# Patient Record
Sex: Male | Born: 1965 | Race: White | Hispanic: No | Marital: Married | Smoking: Former smoker
Health system: Southern US, Community
[De-identification: ages and names within clinical notes are randomized; demographics above are authoritative.]

## PROBLEM LIST (undated history)

## (undated) DIAGNOSIS — E785 Hyperlipidemia, unspecified: Secondary | ICD-10-CM

## (undated) DIAGNOSIS — F909 Attention-deficit hyperactivity disorder, unspecified type: Secondary | ICD-10-CM

## (undated) DIAGNOSIS — F419 Anxiety disorder, unspecified: Secondary | ICD-10-CM

## (undated) DIAGNOSIS — E119 Type 2 diabetes mellitus without complications: Secondary | ICD-10-CM

## (undated) DIAGNOSIS — T7840XA Allergy, unspecified, initial encounter: Secondary | ICD-10-CM

## (undated) DIAGNOSIS — E079 Disorder of thyroid, unspecified: Secondary | ICD-10-CM

## (undated) DIAGNOSIS — G4730 Sleep apnea, unspecified: Secondary | ICD-10-CM

## (undated) DIAGNOSIS — F32A Depression, unspecified: Secondary | ICD-10-CM

## (undated) DIAGNOSIS — F329 Major depressive disorder, single episode, unspecified: Secondary | ICD-10-CM

## (undated) DIAGNOSIS — F3181 Bipolar II disorder: Secondary | ICD-10-CM

## (undated) DIAGNOSIS — K219 Gastro-esophageal reflux disease without esophagitis: Secondary | ICD-10-CM

## (undated) HISTORY — DX: Gastro-esophageal reflux disease without esophagitis: K21.9

## (undated) HISTORY — DX: Hyperlipidemia, unspecified: E78.5

## (undated) HISTORY — DX: Sleep apnea, unspecified: G47.30

## (undated) HISTORY — PX: NO PAST SURGERIES: SHX2092

## (undated) HISTORY — PX: WISDOM TOOTH EXTRACTION: SHX21

## (undated) HISTORY — DX: Attention-deficit hyperactivity disorder, unspecified type: F90.9

## (undated) HISTORY — DX: Bipolar II disorder: F31.81

## (undated) HISTORY — DX: Allergy, unspecified, initial encounter: T78.40XA

## (undated) HISTORY — DX: Type 2 diabetes mellitus without complications: E11.9

## (undated) HISTORY — DX: Disorder of thyroid, unspecified: E07.9

---

## 2008-03-05 ENCOUNTER — Emergency Department (HOSPITAL_COMMUNITY): Admission: EM | Admit: 2008-03-05 | Discharge: 2008-03-05 | Payer: Self-pay | Admitting: Emergency Medicine

## 2008-12-22 ENCOUNTER — Emergency Department (HOSPITAL_COMMUNITY): Admission: EM | Admit: 2008-12-22 | Discharge: 2008-12-22 | Payer: Self-pay | Admitting: Emergency Medicine

## 2009-04-24 ENCOUNTER — Ambulatory Visit (HOSPITAL_BASED_OUTPATIENT_CLINIC_OR_DEPARTMENT_OTHER): Admission: RE | Admit: 2009-04-24 | Discharge: 2009-04-24 | Payer: Self-pay | Admitting: Pediatrics

## 2009-05-02 ENCOUNTER — Ambulatory Visit: Payer: Self-pay | Admitting: Internal Medicine

## 2012-10-17 ENCOUNTER — Institutional Professional Consult (permissible substitution): Payer: Self-pay | Admitting: Pulmonary Disease

## 2012-10-31 ENCOUNTER — Ambulatory Visit (INDEPENDENT_AMBULATORY_CARE_PROVIDER_SITE_OTHER): Payer: BC Managed Care – PPO | Admitting: Pulmonary Disease

## 2012-10-31 ENCOUNTER — Encounter: Payer: Self-pay | Admitting: Pulmonary Disease

## 2012-10-31 VITALS — BP 142/92 | HR 84 | Temp 98.3°F | Ht 74.0 in | Wt 216.2 lb

## 2012-10-31 DIAGNOSIS — G4733 Obstructive sleep apnea (adult) (pediatric): Secondary | ICD-10-CM

## 2012-10-31 DIAGNOSIS — G4769 Other sleep related movement disorders: Secondary | ICD-10-CM

## 2012-10-31 DIAGNOSIS — G4761 Periodic limb movement disorder: Secondary | ICD-10-CM | POA: Insufficient documentation

## 2012-10-31 DIAGNOSIS — R911 Solitary pulmonary nodule: Secondary | ICD-10-CM

## 2012-10-31 NOTE — Patient Instructions (Addendum)
Will get you associated with a medical equipment company to get new supplies Will get you set up for a sleep study with extra monitoring for neurologic issues and movements disorders.  Will also titrate your pressure to an optimal level.  Will arrange followup once the results are available.  You will need a followup ct in one year for your tiny nodule.

## 2012-10-31 NOTE — Assessment & Plan Note (Signed)
The patient has a history of mild obstructive sleep apnea, but now is having breakthrough snoring and events despite wearing bilevel.  This may simply be mask leaking, although he may need to have his pressure optimized again.  We also need to make sure his machine is in working order.  I have discussed the possibility of doing an auto titrating study at home to re-optimize his pressure, but his spouse is concerned about the abnormal movements and what they may represent.  Will therefore do a bilevel titration study while at the same time analyzing his movement disorder.

## 2012-10-31 NOTE — Assessment & Plan Note (Signed)
The patient has a tiny pulmonary nodule that most likely is an intraparenchymal lymph node or possibly an old histoplasmoma.  I would recommend one more followup in a year.

## 2012-10-31 NOTE — Progress Notes (Signed)
Subjective:    Patient ID: Zachary Dixon, male    DOB: 12-Nov-1965, 47 y.o.   MRN: [id number]  HPI The patient is a 47 year old male who I have been asked to see for an abnormal CT chest and also ongoing sleep issues.  The patient was recently having atypical chest pain, and underwent a CT scan of the chest that was unremarkable except for a 4 mm subpleural nodule in the right lower lobe.  The patient has little smoking history, and no personal history of cancer.  He has no history of tuberculosis exposure, and never has had a PPD placed.  He did grow up in the [place].  He has not been losing weight, and his appetite has been adequate.  The patient also carries a diagnosis of obstructive sleep apnea, and was started on bilevel approximately 2-3 years ago.  He has had multiple sleep studies, with the most recent available to me being in 2010 where he had an AHI of 9 and a vent per hour.  He was also noted to have periodic limb movements with 2 per hour causing arousals or awakenings.  The patient has not had his pressure changed since the initial diagnosis, and currently uses a full face mask with a heated humidifier.  His current mask has not been updated since 2013, and his wife states that she can hear breakthrough snoring on many nights.  He is unsure if he is having a lot of leaks.  The patient's weight has increased about 10 pounds from his original diagnosis.  The patient feels that he is more rested now than in the past, but does continue to have some inappropriate daytime sleepiness.  His wife also notes abnormal limb movements for years.  She describes rhythmic movements and stiffening of his legs, as well as sudden jerking of his upper extremity.  He has no history of a seizure disorder, and denies any symptoms suggestive of the restless leg syndrome.  These movements always start after he is asleep, but she never gets the feeling that he is "acting out his dreams".  He does not have sleep walking or  sleep talking, nor has he had violent behavior at night.  The patient does have a long history of depression.  He has been treated with some type of medication for his movements, and from the description it may have been a dopamine agonist that resulted in nausea.   Review of Systems  Constitutional: Negative for fever and unexpected weight change.  HENT: Positive for congestion and sneezing. Negative for ear pain, nosebleeds, sore throat, rhinorrhea, trouble swallowing, dental problem, postnasal drip and sinus pressure.   Eyes: Negative for redness and itching.  Respiratory: Positive for cough and shortness of breath. Negative for chest tightness and wheezing.   Cardiovascular: Positive for chest pain. Negative for palpitations and leg swelling.  Gastrointestinal: Negative for nausea and vomiting.       Acid heartburn  Genitourinary: Negative for dysuria.  Musculoskeletal: Negative for joint swelling.  Skin: Negative for rash.  Neurological: Negative for headaches.  Hematological: Does not bruise/bleed easily.  Psychiatric/Behavioral: Positive for dysphoric mood. The patient is not nervous/anxious.        Objective:   Physical Exam Constitutional:  Well developed, no acute distress  HENT:  Nares patent without discharge, mild turbinate hypertrophy  Oropharynx without exudate, palate and uvula are mildly elongated, mild webbing laterally in OP  Eyes:  Perrla, eomi, no scleral icterus  Neck:  No JVD, no  TMG  Cardiovascular:  Normal rate, regular rhythm, no rubs or gallops.  No murmurs        Intact distal pulses  Pulmonary :  Normal breath sounds, no stridor or respiratory distress   No rales, rhonchi, or wheezing  Abdominal:  Soft, nondistended, bowel sounds present.  No tenderness noted.   Musculoskeletal:  No lower extremity edema noted.  Lymph Nodes:  No cervical lymphadenopathy noted  Skin:  No cyanosis noted  Neurologic:  Appears sleepy but appropriate, moves all 4  extremities without obvious deficit.         Assessment & Plan:

## 2012-10-31 NOTE — Assessment & Plan Note (Signed)
The wife's description of the patient's movements are a little unusual for periodic limb movements.  The patient also denies any symptoms suggestive of restless legs.  I think we need to consider whether this could represent some type of seizure disorder, or other neurologic process.  We'll therefore do a sleep study with an expanded seizure montage, as well as extra leads for upper extremity movement.

## 2012-11-18 ENCOUNTER — Encounter (HOSPITAL_BASED_OUTPATIENT_CLINIC_OR_DEPARTMENT_OTHER): Payer: BC Managed Care – PPO

## 2012-11-20 ENCOUNTER — Ambulatory Visit (HOSPITAL_BASED_OUTPATIENT_CLINIC_OR_DEPARTMENT_OTHER): Payer: BC Managed Care – PPO | Attending: Pulmonary Disease

## 2012-11-20 VITALS — Ht 74.0 in | Wt 216.0 lb

## 2012-11-20 DIAGNOSIS — G4733 Obstructive sleep apnea (adult) (pediatric): Secondary | ICD-10-CM | POA: Insufficient documentation

## 2012-11-20 DIAGNOSIS — Z9989 Dependence on other enabling machines and devices: Secondary | ICD-10-CM

## 2012-11-20 DIAGNOSIS — G4769 Other sleep related movement disorders: Secondary | ICD-10-CM

## 2012-11-20 DIAGNOSIS — R259 Unspecified abnormal involuntary movements: Secondary | ICD-10-CM | POA: Insufficient documentation

## 2012-12-01 DIAGNOSIS — G4710 Hypersomnia, unspecified: Secondary | ICD-10-CM

## 2012-12-01 DIAGNOSIS — G4730 Sleep apnea, unspecified: Secondary | ICD-10-CM

## 2012-12-01 NOTE — Procedures (Signed)
NAMEGILBERTO, STANFORTH              ACCOUNT NO.:  [id number]  MEDICAL RECORD NO.:  [id number]          PATIENT TYPE:  OUT  LOCATION:  SLEEP CENTER                 FACILITY:  [facility]  PHYSICIAN:  Barbaraann Share, MD,FCCPDATE OF BIRTH:  04/09/68  DATE OF STUDY:  11/20/2012                           NOCTURNAL POLYSOMNOGRAM  REFERRING PHYSICIAN:  Barbaraann Share, MD,FCCP  INDICATION FOR STUDY:  Hypersomnia with sleep apnea.  EPWORTH SCORE:  11.  SLEEP ARCHITECTURE:  The patient had total sleep time of 352 minutes with decreased quantity of slow wave sleep and also REM.  Sleep onset latency was normal at 5 minutes and REM onset was prolonged at 300 minutes.  Sleep efficiency was fairly normal at 93%.  RESPIRATORY DATA:  The patient underwent a bilevel titration study, and was fitted with a large ResMed AirFit F10 full-face mask.  Bilevel was initiated, and he was ultimately found to have an optimal pressure of 13/8.  This controlled both his obstructive events and snoring, including during REM.  OXYGEN DATA:  There was O2 desaturation as low as 82% with the patient's obstructive events.  CARDIAC DATA:  No clinically significant arrhythmias were noted.  MOVEMENTS/PARASOMNIA:  The patient was found to have 378 periodic limb movements, with 5 per hour resulting in arousal or awakening.  However, almost all of these occurred prior to reaching optimal bilevel pressure, and essentially totally resolved once the patient had adequate control of his obstructive events.  Because of the patient's history of movements during the night, this study was done with extra limb leads, however, there was no evidence for REM behavior disorder or other neurologic issue during this night.  IMPRESSION/RECOMMENDATIONS: 1. Good control of previously documented obstructive sleep apnea with     a bilevel pressure of 13/8, delivered by a large ResMed AirFit F10     full-face mask.  The patient should also be  encouraged to work on     modest weight loss 2. Very large numbers of leg jerks with significant sleep disruption,     however, these essentially resolved at     optimal bilevel pressure.  The patient had no abnormal behaviors     noted during the night, or any indicators for a REM behavior     disorder or other parasomnia.     Barbaraann Share, MD,FCCP Diplomate, American Board of Sleep Medicine    [facility]/MEDQ  D:  12/01/2012 14:42:54  T:  12/01/2012 15:17:19  Job:  [id number]

## 2012-12-07 ENCOUNTER — Other Ambulatory Visit: Payer: Self-pay | Admitting: Pulmonary Disease

## 2012-12-07 DIAGNOSIS — G4769 Other sleep related movement disorders: Secondary | ICD-10-CM

## 2012-12-07 DIAGNOSIS — G4733 Obstructive sleep apnea (adult) (pediatric): Secondary | ICD-10-CM

## 2012-12-26 ENCOUNTER — Telehealth: Payer: Self-pay | Admitting: Pulmonary Disease

## 2012-12-26 NOTE — Telephone Encounter (Signed)
Order placed for [facility]  12/07/12 ?[facility] Start bilevel at 13/8 with large resmed airfit F10 full face mask. Pt needs ov with me in 8 weeks.  [facility] recorded this again and has patient on schedule in the AM at 830 to p/u supplies.

## 2013-02-18 ENCOUNTER — Ambulatory Visit (INDEPENDENT_AMBULATORY_CARE_PROVIDER_SITE_OTHER): Payer: BC Managed Care – PPO | Admitting: Pulmonary Disease

## 2013-02-18 ENCOUNTER — Encounter: Payer: Self-pay | Admitting: Pulmonary Disease

## 2013-02-18 VITALS — BP 130/82 | HR 79 | Temp 97.4°F | Ht 74.0 in | Wt 212.4 lb

## 2013-02-18 DIAGNOSIS — G4733 Obstructive sleep apnea (adult) (pediatric): Secondary | ICD-10-CM

## 2013-02-18 DIAGNOSIS — G4761 Periodic limb movement disorder: Secondary | ICD-10-CM

## 2013-02-18 MED ORDER — ROPINIROLE HCL 0.5 MG PO TABS: 0.5000 mg | ORAL_TABLET | Freq: Every evening | ORAL | Status: DC

## 2013-02-18 NOTE — Assessment & Plan Note (Signed)
The patient had classic periodic limb movements at the time of his sleep study, however these resolved with optimal treatment of his sleep apnea.  Despite being on appropriate bilevel, he continues to have leg kicks in his home environment, and they are quite disruptive.  I think it is worth trying him on a dopamine agonist to see if this will help.

## 2013-02-18 NOTE — Assessment & Plan Note (Signed)
The patient appears to be tolerating bilevel well from a pressure standpoint, and it has eliminated breakthrough snoring.  However, he is having issues with air gulping, and I outlined various ways to combat this.  These include a trial of Mylicon drops at night, changing to a nasal mask with a chin strap, decreasing the pressure that understanding he may have breakthrough events, and finally trying him on an auto bilevel device.  The patient will consider all of these, and get back with me.

## 2013-02-18 NOTE — Progress Notes (Signed)
  Subjective:    Patient ID: Zachary Dixon, male    DOB: [birth year], 47 y.o.   MRN: [id number]  HPI Patient comes in today for followup of his obstructive sleep apnea.  He recently had a titration study where he was found to have optimal pressure of 13/8.  He did have a lot of leg jerks during the night, but these resolved at optimal bilevel pressure.  He has been wearing his device compliantly at the new pressure, and his wife has not heard breakthrough snoring.  His current full face mask seems to fit well and he denies any complaints with it.  However, he is having an issue with air gulping leading to gastric distention in the mornings.  He is also continuing to have large numbers of leg jerks during the night which disrupts both his and his wife's sleep.  He still does not have classical symptoms for RLS.     Review of Systems  Constitutional: Negative for fever and unexpected weight change.  HENT: Negative for ear pain, nosebleeds, congestion, sore throat, rhinorrhea, sneezing, trouble swallowing, dental problem, postnasal drip and sinus pressure.   Eyes: Negative for redness and itching.  Respiratory: Negative for cough, chest tightness, shortness of breath and wheezing.   Cardiovascular: Negative for palpitations and leg swelling.  Gastrointestinal: Negative for nausea and vomiting.  Genitourinary: Negative for dysuria.  Musculoskeletal: Negative for joint swelling.  Skin: Negative for rash.  Neurological: Negative for headaches.  Hematological: Does not bruise/bleed easily.  Psychiatric/Behavioral: Negative for dysphoric mood. The patient is not nervous/anxious.        Objective:   Physical Exam Well-developed male in no acute distress Nose without purulence or discharge noted No skin breakdown or pressure necrosis from the CPAP mask Neck without lymphadenopathy or thyromegaly Lower extremities without edema, no cyanosis Awake, but does appear mildly sleepy.  Moves all 4  extremities.       Assessment & Plan:

## 2013-02-18 NOTE — Patient Instructions (Addendum)
Think about options we discussed to limit air gulping, and let us know Will start on requip 0.5 mg after dinner each night.  If helps, but not completely, can increase to 2 after dinner after one week.  Please give me some feedback by phone in 2-3 weeks with how things are going. If doing well, followup with me in 6mos.

## 2013-08-19 ENCOUNTER — Ambulatory Visit (INDEPENDENT_AMBULATORY_CARE_PROVIDER_SITE_OTHER): Payer: BC Managed Care – PPO | Admitting: Pulmonary Disease

## 2013-08-19 ENCOUNTER — Encounter (INDEPENDENT_AMBULATORY_CARE_PROVIDER_SITE_OTHER): Payer: Self-pay

## 2013-08-19 ENCOUNTER — Encounter: Payer: Self-pay | Admitting: Pulmonary Disease

## 2013-08-19 VITALS — BP 118/60 | HR 79 | Temp 98.4°F | Ht 74.0 in | Wt 216.4 lb

## 2013-08-19 DIAGNOSIS — G4761 Periodic limb movement disorder: Secondary | ICD-10-CM

## 2013-08-19 DIAGNOSIS — G4733 Obstructive sleep apnea (adult) (pediatric): Secondary | ICD-10-CM

## 2013-08-19 DIAGNOSIS — R911 Solitary pulmonary nodule: Secondary | ICD-10-CM

## 2013-08-19 MED ORDER — PRAMIPEXOLE DIHYDROCHLORIDE 0.125 MG PO TABS: ORAL_TABLET | ORAL | Status: DC

## 2013-08-19 NOTE — Assessment & Plan Note (Signed)
The patient has a pulmonary nodule that is probably old granulomatous disease from living in the [place]. He is due for his followup scan at one year.

## 2013-08-19 NOTE — Progress Notes (Signed)
   Subjective:    Patient ID: Zachary Dixon, male    DOB: 1965-10-20, 48 y.o.   MRN: [id number]  HPI The patient comes in today for followup of his multiple sleep issues, and also his pulmonary nodule. He is wearing his bilevel compliantly, and although is still having some air gulping, it is not overly bothersome to him. He does not want to try and decrease his pressure or look into a dental appliance. The patient also has a history of periodic limb movements, and this continues to be an issue for him that is disrupting sleep. He quit using his Requip proximally 6 months ago because of nausea, but it is unclear if he is really taking it on a full stomach nightly. He is willing to try a different dopamine agonist oriented category of medication. Finally, he is due for a one-year followup of his 4 mm nodule. He has no personal history of cancer, very little smoking history, and does live in the [place]. If his followup scan shows no change, he will need no further followup. Of note, the patient has been having trouble with cough and upper airway symptoms, and has been evaluated by an allergist who is trying various medications for this.   Review of Systems  Constitutional: Negative for fever and unexpected weight change.  HENT: Negative for congestion, dental problem, ear pain, nosebleeds, postnasal drip, rhinorrhea, sinus pressure, sneezing, sore throat and trouble swallowing.   Eyes: Negative for redness and itching.  Respiratory: Positive for cough, chest tightness and shortness of breath. Negative for wheezing.   Cardiovascular: Negative for palpitations and leg swelling.  Gastrointestinal: Negative for nausea and vomiting.  Genitourinary: Negative for dysuria.  Musculoskeletal: Negative for joint swelling.  Skin: Negative for rash.  Neurological: Negative for headaches.  Hematological: Does not bruise/bleed easily.  Psychiatric/Behavioral: Negative for dysphoric mood. The patient is not  nervous/anxious.        Objective:   Physical Exam Thin male in no acute distress Nose without purulence or discharge noted No skin breakdown or pressure necrosis from the CPAP mask Neck without lymphadenopathy or thyromegaly Lower extremities without edema, no cyanosis Alert and oriented, moves all 4 extremities.       Assessment & Plan:

## 2013-08-19 NOTE — Patient Instructions (Signed)
Will schedule for followup ct chest for your nodule,and will call you with results. Stop requip, and try mirapex 0.125mg  one AFTER DINNER each night for first week, and can increase to 2 after dinner if tolerating and still having breakthru symptoms.   Let me know if tolerance issues, and there are other things we can try. Stay on bilevel, but let me know if air gulping becomes less tolerable.   Can also consider dental appliance.  followup with me again in one year.

## 2013-08-19 NOTE — Assessment & Plan Note (Signed)
The patient was not able to tolerate Requip because of nausea, but I suspect he was not taking on a full stomach. I will try him on Mirapex instead, and he is to call if he continues to have intolerance with this. His leg kicks are still bothering both he and his wife's sleep.

## 2013-08-19 NOTE — Addendum Note (Signed)
Addended by: Virl Cagey on: 08/19/2013 11:36 AM   Modules accepted: Orders

## 2013-08-19 NOTE — Assessment & Plan Note (Signed)
The patient overall appears to be doing fairly well with bilevel, and although is still swallowing air, it is not overly significant at this time. He does not want to consider decreasing his pressure, since he is sleeping very well on this and his wife is pleased. I've also discussed with him again the possibility of a dental appliance.

## 2013-08-23 ENCOUNTER — Ambulatory Visit (INDEPENDENT_AMBULATORY_CARE_PROVIDER_SITE_OTHER)
Admission: RE | Admit: 2013-08-23 | Discharge: 2013-08-23 | Disposition: A | Discharge status: Home / Self Care | Payer: BC Managed Care – PPO | Source: Ambulatory Visit | Attending: Pulmonary Disease | Admitting: Pulmonary Disease

## 2013-08-23 DIAGNOSIS — R911 Solitary pulmonary nodule: Secondary | ICD-10-CM

## 2013-08-28 ENCOUNTER — Telehealth: Payer: Self-pay | Admitting: Pulmonary Disease

## 2013-08-28 NOTE — Telephone Encounter (Signed)
Result Notes    Notes Recorded by Kathee Delton, MD on 08/28/2013 at 12:18 PM Let pt know that his tiny 3mm nodule on right is unchanged from last year. No further followup is needed. Great news   -----  Called, spoke with pt's wife (dpr scanned in chart).  Informed her of results and recs per [facility].  She verbalized understanding, will inform pt, and will have him call back if he has any further questions or concerns.

## 2013-08-28 NOTE — Progress Notes (Signed)
Quick Note:  [name] aware of CT results and recs and will inform pt. See phone msg from 08/28/13. ______

## 2013-11-01 ENCOUNTER — Ambulatory Visit: Payer: BC Managed Care – PPO | Admitting: Pulmonary Disease

## 2014-07-10 ENCOUNTER — Other Ambulatory Visit: Payer: Self-pay | Admitting: Pulmonary Disease

## 2014-08-22 ENCOUNTER — Encounter (INDEPENDENT_AMBULATORY_CARE_PROVIDER_SITE_OTHER): Payer: Self-pay

## 2014-08-22 ENCOUNTER — Ambulatory Visit (INDEPENDENT_AMBULATORY_CARE_PROVIDER_SITE_OTHER): Payer: BLUE CROSS/BLUE SHIELD | Admitting: Pulmonary Disease

## 2014-08-22 ENCOUNTER — Encounter: Payer: Self-pay | Admitting: Pulmonary Disease

## 2014-08-22 VITALS — BP 122/74 | HR 92 | Temp 97.9°F | Ht 74.0 in | Wt 217.0 lb

## 2014-08-22 DIAGNOSIS — G4733 Obstructive sleep apnea (adult) (pediatric): Secondary | ICD-10-CM

## 2014-08-22 DIAGNOSIS — R911 Solitary pulmonary nodule: Secondary | ICD-10-CM

## 2014-08-22 DIAGNOSIS — G4761 Periodic limb movement disorder: Secondary | ICD-10-CM

## 2014-08-22 NOTE — Assessment & Plan Note (Signed)
The patient is doing very well on the Mirapex, and feels that it has significantly improved his symptoms and sleep.

## 2014-08-22 NOTE — Progress Notes (Signed)
   Subjective:    Patient ID: Zachary Dixon, male    DOB: 1965-08-02, 49 y.o.   MRN: [id number]  HPI The patient comes in today for follow-up of his known obstructive sleep apnea and periodic limb movement disorder. He is continuing on C Pap, and feels that he is doing well with the device. He has only occasional issue with air gulping, and feels that he sleeps well. He is taking Mirapex daily, and thinks it has made a big difference to his limb movements in his sleep.   Review of Systems  Constitutional: Negative for fever and unexpected weight change.  HENT: Negative for congestion, dental problem, ear pain, nosebleeds, postnasal drip, rhinorrhea, sinus pressure, sneezing, sore throat and trouble swallowing.   Eyes: Negative for redness and itching.  Respiratory: Negative for cough, chest tightness, shortness of breath and wheezing.   Cardiovascular: Negative for palpitations and leg swelling.  Gastrointestinal: Negative for nausea and vomiting.  Genitourinary: Negative for dysuria.  Musculoskeletal: Negative for joint swelling.  Skin: Negative for rash.  Neurological: Negative for headaches.  Hematological: Does not bruise/bleed easily.  Psychiatric/Behavioral: Negative for dysphoric mood. The patient is not nervous/anxious.        Objective:   Physical Exam Well-developed male in no acute distress Nose without purulence or discharge noted No skin breakdown or pressure necrosis from the C Pap mask Neck without lymphadenopathy or thyromegaly Lower extremities without edema, no cyanosis Alert and oriented, moves all 4 extremities.       Assessment & Plan:

## 2014-08-22 NOTE — Assessment & Plan Note (Signed)
The patient had a CT chest last year that showed no change in his nodule, and by Fleischner criteria needs no further follow-up.

## 2014-08-22 NOTE — Assessment & Plan Note (Signed)
The pt is doing well with cpap, and is having no issues with mask fit or pressure.  I have asked him to keep up with mask changes and supplies, and to follow-up with me in one year.

## 2014-08-22 NOTE — Patient Instructions (Signed)
Will refill mirapex. Stay on cpap, and keep up with mask changes and supplies. followup with me again in one year.

## 2014-12-16 ENCOUNTER — Telehealth: Payer: Self-pay | Admitting: Pulmonary Disease

## 2014-12-16 DIAGNOSIS — G4733 Obstructive sleep apnea (adult) (pediatric): Secondary | ICD-10-CM

## 2014-12-16 NOTE — Telephone Encounter (Signed)
Need to find out if he has a cpap machine or bilevel device.

## 2014-12-16 NOTE — Telephone Encounter (Signed)
Called and spoke to pt. Pt is requesting a new CPAP machine. Pt stated his is older than 5 years and is making loud strange noises. Pt stated he now owns the machine and has to pay for repairs and last time pt took machine to [facility] for an issue they were unsure what the issue was because the machine is an older model.   [facility] please advise if ok to send order. Thanks.

## 2014-12-16 NOTE — Telephone Encounter (Signed)
Order placed and pt aware. nothing further needed

## 2014-12-16 NOTE — Telephone Encounter (Signed)
Ok to order resmed bilevel device with h/h and climate control tubing.  Set on same pressure.

## 2014-12-16 NOTE — Telephone Encounter (Signed)
Patient says that he has bi-level.

## 2015-01-02 ENCOUNTER — Telehealth: Payer: Self-pay | Admitting: Pulmonary Disease

## 2015-01-02 DIAGNOSIS — G4733 Obstructive sleep apnea (adult) (pediatric): Secondary | ICD-10-CM

## 2015-01-02 NOTE — Telephone Encounter (Signed)
Patient placed order for bilevel machine and would like to check on status of bilevel.  Has not received the CPAP machine yet.  Called [facility] to check on Status.  [facility] never received order, had to reorder Bilevel Patient given phone number for [facility] to follow up on order. Nothing further needed.

## 2015-01-02 NOTE — Telephone Encounter (Signed)
Spoke with patient's wife, she says that patient does not want to use [facility] and would rather use [facility], patient had bad experience with [facility].    [facility] - can you please handle this?  [facility] called and said that they sent the order to [facility].Marland Kitchen

## 2015-01-05 NOTE — Telephone Encounter (Signed)
Spoke to Zachary Dixon  she is aware now that pt wants to use them and not [facility] refaxed records and order to Zachary Dixon  Zachary Dixon

## 2015-04-17 ENCOUNTER — Encounter (HOSPITAL_COMMUNITY): Payer: Self-pay | Admitting: Emergency Medicine

## 2015-04-17 ENCOUNTER — Emergency Department (HOSPITAL_COMMUNITY): Payer: BLUE CROSS/BLUE SHIELD

## 2015-04-17 ENCOUNTER — Emergency Department (HOSPITAL_COMMUNITY)
Admission: EM | Admit: 2015-04-17 | Discharge: 2015-04-17 | Disposition: A | Discharge status: Home / Self Care | Payer: BLUE CROSS/BLUE SHIELD | Attending: Physician Assistant | Admitting: Physician Assistant

## 2015-04-17 DIAGNOSIS — Z87891 Personal history of nicotine dependence: Secondary | ICD-10-CM | POA: Insufficient documentation

## 2015-04-17 DIAGNOSIS — Z79899 Other long term (current) drug therapy: Secondary | ICD-10-CM | POA: Diagnosis not present

## 2015-04-17 DIAGNOSIS — R079 Chest pain, unspecified: Secondary | ICD-10-CM | POA: Diagnosis present

## 2015-04-17 DIAGNOSIS — F419 Anxiety disorder, unspecified: Secondary | ICD-10-CM | POA: Insufficient documentation

## 2015-04-17 DIAGNOSIS — Z8639 Personal history of other endocrine, nutritional and metabolic disease: Secondary | ICD-10-CM | POA: Diagnosis not present

## 2015-04-17 HISTORY — DX: Anxiety disorder, unspecified: F41.9

## 2015-04-17 LAB — COMPREHENSIVE METABOLIC PANEL
ALBUMIN: 3.9 g/dL (ref 3.5–5.0)
ALK PHOS: 65 U/L (ref 38–126)
ALT: 22 U/L (ref 17–63)
AST: 21 U/L (ref 15–41)
Anion gap: 5 (ref 5–15)
BILIRUBIN TOTAL: 0.8 mg/dL (ref 0.3–1.2)
BUN: 19 mg/dL (ref 6–20)
CALCIUM: 9 mg/dL (ref 8.9–10.3)
CO2: 26 mmol/L (ref 22–32)
CREATININE: 1.16 mg/dL (ref 0.61–1.24)
Chloride: 105 mmol/L (ref 101–111)
GFR calc Af Amer: >60 mL/min (ref >60)
GFR calc non Af Amer: >60 mL/min (ref >60)
GLUCOSE: 113 mg/dL — AB (ref 65–99)
Potassium: 4 mmol/L (ref 3.5–5.1)
Sodium: 136 mmol/L (ref 135–145)
TOTAL PROTEIN: 6.8 g/dL (ref 6.5–8.1)

## 2015-04-17 LAB — CBC WITH DIFFERENTIAL/PLATELET
BASOS ABS: 0 10*3/uL (ref 0.0–0.1)
BASOS PCT: 0 %
Eosinophils Absolute: 0.1 10*3/uL (ref 0.0–0.7)
Eosinophils Relative: 3 %
HEMATOCRIT: 43.1 % (ref 39.0–52.0)
HEMOGLOBIN: 14.9 g/dL (ref 13.0–17.0)
LYMPHS PCT: 40 %
Lymphs Abs: 2 10*3/uL (ref 0.7–4.0)
MCH: 31.6 pg (ref 26.0–34.0)
MCHC: 34.6 g/dL (ref 30.0–36.0)
MCV: 91.3 fL (ref 78.0–100.0)
MONOS PCT: 6 %
Monocytes Absolute: 0.3 10*3/uL (ref 0.1–1.0)
NEUTROS ABS: 2.6 10*3/uL (ref 1.7–7.7)
NEUTROS PCT: 51 %
Platelets: 181 10*3/uL (ref 150–400)
RBC: 4.72 MIL/uL (ref 4.22–5.81)
RDW: 12.2 % (ref 11.5–15.5)
WBC: 5.1 10*3/uL (ref 4.0–10.5)

## 2015-04-17 LAB — I-STAT TROPONIN, ED: Troponin i, poc: 0 ng/mL (ref 0.00–0.08)

## 2015-04-17 LAB — LIPASE, BLOOD: Lipase: 32 U/L (ref 22–51)

## 2015-04-17 MED ORDER — LORAZEPAM 0.5 MG PO TABS
0.5000 mg | ORAL_TABLET | Freq: Once | ORAL | Status: AC
  Administered 2015-04-17: 0.5 mg via ORAL
  Filled 2015-04-17: qty 1

## 2015-04-17 NOTE — ED Provider Notes (Signed)
CSN: [id number]     Arrival date & time 04/17/15  9470 History   First MD Initiated Contact with Patient 04/17/15 0935     Chief Complaint  Patient presents with  . Chest Pain     (Consider location/radiation/quality/duration/timing/severity/associated sxs/prior Treatment) HPI   Patient is a very friendly 49 year old male presenting with anxiety and chest pain. Patient had anxiety this morning, concerned about work developed shortness of breath and then occasional chest pain. He reports is not stabbing nor pressure just pain like with anxiety.  Patient recently decreased his Cymbalta. He is on 2 medications that both affect serotonin. There was concern that there was a serotonin syndrome therefor hedecreased Cymbalta. This may have increased his anxiety.  Patient does not have hypertension hyperlipidemia and does not smoke. He has no immediate family history of cardiac disease.  Past Medical History  Diagnosis Date  . Hyperlipidemia   . Allergy   . Sleep apnea   . Anxiety    Past Surgical History  Procedure Laterality Date  . No past surgeries     Family History  Problem Relation Age of Onset  . Asthma Father   . Emphysema Father   . Heart disease Paternal Grandfather   . Heart disease Paternal Grandmother   . Breast cancer Maternal Grandmother   . Colon cancer Maternal Grandfather    Social History  Substance Use Topics  . Smoking status: Former Smoker -- 1.00 packs/day for 7 years    Types: Cigarettes    Quit date: 07/11/1993  . Smokeless tobacco: Never Used  . Alcohol Use: Yes     Comment: 1-2 drinks daily    Review of Systems  Constitutional: Negative for fever and activity change.  HENT: Negative for drooling and hearing loss.   Eyes: Negative for discharge and redness.  Respiratory: Negative for cough and shortness of breath.   Cardiovascular: Positive for chest pain. Negative for palpitations.  Gastrointestinal: Negative for abdominal pain.   Genitourinary: Negative for dysuria and urgency.  Musculoskeletal: Negative for arthralgias.  Allergic/Immunologic: Negative for immunocompromised state.  Neurological: Negative for weakness and numbness.  Psychiatric/Behavioral: Positive for dysphoric mood and agitation. Negative for suicidal ideas and behavioral problems. The patient is nervous/anxious.   All other systems reviewed and are negative.     Allergies  Review of patient's allergies indicates no known allergies.  Home Medications   Prior to Admission medications   Medication Sig Start Date End Date Taking? Authorizing Provider  budesonide-formoterol (SYMBICORT) 160-4.5 MCG/ACT inhaler Inhale 2 puffs into the lungs 2 (two) times daily.    Historical Provider, MD  DULoxetine (CYMBALTA) 60 MG capsule Take 1 capsule by mouth daily. 10/14/12   Historical Provider, MD  pramipexole (MIRAPEX) 0.125 MG tablet 2 tabs after dinner 07/14/14   Kathee Delton, MD  traZODone (DESYREL) 50 MG tablet Take 50-150 mg by mouth at bedtime.    Historical Provider, MD  [name] 50 MG capsule Take 50 mg by mouth every morning. 08/09/14   Historical Provider, MD   BP 126/86 mmHg  Pulse 73  Temp(Src) 98.5 F (36.9 C) (Oral)  Resp 16  SpO2 98% Physical Exam  Constitutional: He is oriented to person, place, and time. He appears well-nourished.  HENT:  Head: Normocephalic.  Mouth/Throat: Oropharynx is clear and moist.  Eyes: Conjunctivae are normal.  Neck: No tracheal deviation present.  Cardiovascular: Normal rate.   Pulmonary/Chest: Effort normal. No stridor. No respiratory distress.  Abdominal: Soft. There is no tenderness. There  is no guarding.  Musculoskeletal: Normal range of motion. He exhibits no edema.  Neurological: He is oriented to person, place, and time. No cranial nerve deficit.  Skin: Skin is warm and dry. No rash noted. He is not diaphoretic.  Psychiatric: He has a normal mood and affect. His behavior is normal.  Nursing note  and vitals reviewed.   ED Course  Procedures (including critical care time) Labs Review Labs Reviewed  CBC WITH DIFFERENTIAL/PLATELET  COMPREHENSIVE METABOLIC PANEL  LIPASE, BLOOD  I-STAT TROPOININ, ED    Imaging Review No results found. I have personally reviewed and evaluated these images and lab results as part of my medical decision-making.   EKG Interpretation   Date/Time:  Friday April 17 2015 09:24:30 EDT Ventricular Rate:  79 PR Interval:  136 QRS Duration: 103 QT Interval:  392 QTC Calculation: 449 R Axis:   76 Text Interpretation:  Sinus rhythm no acute ischemia No significant change  since last tracing Confirmed by Gerald Leitz ([id number]) on 04/17/2015  9:36:30 AM      MDM   Final diagnoses:  None    Recent a healthy 48 year old male presenting with anxiety and chest pain. I believe the chest pain is related to anxiety. We will do single troponin and EKG today. EKG appears at baseline. Patient has no hypertension hyperlipidemia nonsmoker and no family history in his immediate family. Therefore I think he is very low risk. We will give him something for anxiety to make him feel better and then plan to discharge him to follow-up with a psychiatrist.  Macarthur Critchley, MD 04/17/15 [phone]

## 2015-04-17 NOTE — Discharge Instructions (Signed)
Panic Attacks °Panic attacks are sudden, short feelings of great fear or discomfort. You may have them for no reason when you are relaxed, when you are uneasy (anxious), or when you are sleeping.  °HOME CARE °· Take all your medicines as told. °· Check with your doctor before starting new medicines. °· Keep all doctor visits. °GET HELP IF: °· You are not able to take your medicines as told. °· Your symptoms do not get better. °· Your symptoms get worse. °GET HELP RIGHT AWAY IF: °· Your attacks seem different than your normal attacks. °· You have thoughts about hurting yourself or others. °· You take panic attack medicine and you have a side effect. °MAKE SURE YOU: °· Understand these instructions. °· Will watch your condition. °· Will get help right away if you are not doing well or get worse. °  °This information is not intended to replace advice given to you by your health care provider. Make sure you discuss any questions you have with your health care provider. °  °Document Released: 07/30/2010 Document Revised: 04/17/2013 Document Reviewed: 02/08/2013 °Elsevier Interactive Patient Education ©2016 [employer] ° °

## 2015-04-17 NOTE — ED Notes (Signed)
Pt reports central CP that started an hour ago. Pain accompanied by SOB, dizziness and back pain. Denies any recent heavy lifting or strenuous activity

## 2015-04-17 NOTE — ED Notes (Signed)
Pt states that he is being followed for the past 4 weeks for possible serotonin syndrome due to medications. Psych md has decreased dose approx 4 weeks ago. Has had hot flashes, dizziness, and some anxiety. This am has had some generalized lt side chest pain, no sob. States that he has had this in the past and they thought it could be panic attack. Does follow a cardiology PRN.

## 2015-05-19 ENCOUNTER — Encounter: Payer: Self-pay | Admitting: Pulmonary Disease

## 2015-08-24 ENCOUNTER — Ambulatory Visit: Payer: BLUE CROSS/BLUE SHIELD | Admitting: Pulmonary Disease

## 2015-10-01 ENCOUNTER — Ambulatory Visit (HOSPITAL_COMMUNITY): Payer: BLUE CROSS/BLUE SHIELD | Admitting: Psychiatry

## 2015-10-28 ENCOUNTER — Institutional Professional Consult (permissible substitution): Payer: BLUE CROSS/BLUE SHIELD | Admitting: Pulmonary Disease

## 2015-10-29 ENCOUNTER — Telehealth: Payer: Self-pay | Admitting: Pulmonary Disease

## 2015-10-29 NOTE — Telephone Encounter (Signed)
lmtcb x1 for pt. 

## 2015-10-30 NOTE — Telephone Encounter (Signed)
Dr. Corrie Dandy, this patient is a former Dr. Gwenette Greet patient needing an appointment.  You have a 15 min slot, but we usually schedule Cl[facility] follow up patients for 30 min.  Please advise where we can put patient on schedule.  Thanks.

## 2015-10-31 NOTE — Telephone Encounter (Signed)
Can you add him for next week -- either mon, tues, or wed ? Noon time or in afternoon?   AD

## 2015-11-02 NOTE — Telephone Encounter (Signed)
Patient called back - scheduled him on 11/04/15 @ 10:30am per this note. - prm

## 2015-11-02 NOTE — Telephone Encounter (Signed)
An appointment slot has been held on 11/04/15 at 10:30am. lmtcb x1 for pt.

## 2015-11-04 ENCOUNTER — Ambulatory Visit (INDEPENDENT_AMBULATORY_CARE_PROVIDER_SITE_OTHER): Payer: BLUE CROSS/BLUE SHIELD | Admitting: Pulmonary Disease

## 2015-11-04 ENCOUNTER — Encounter: Payer: Self-pay | Admitting: Pulmonary Disease

## 2015-11-04 VITALS — BP 142/88 | HR 89 | Ht 74.0 in | Wt 221.0 lb

## 2015-11-04 DIAGNOSIS — G4761 Periodic limb movement disorder: Secondary | ICD-10-CM | POA: Diagnosis not present

## 2015-11-04 DIAGNOSIS — G4733 Obstructive sleep apnea (adult) (pediatric): Secondary | ICD-10-CM | POA: Diagnosis not present

## 2015-11-04 DIAGNOSIS — R911 Solitary pulmonary nodule: Secondary | ICD-10-CM | POA: Diagnosis not present

## 2015-11-04 NOTE — Assessment & Plan Note (Addendum)
NPSG 2010:  AHI 9/hr, +PLMS with 2/hr with arousal and awakening.  His machine in 2010 is a Bipap but it is not working. Bipap was a lot of pressure. He uses a friend's cpap x 7 mos. He is on 7-13.   Pt has hypersomnia, snoring, witnessed apneas, fatigue on current machine. Bipap stopped working and is not delivering enough pressure.  Has  Bloating with BiPaP.   Plan : 1. HST. 2. Try autocpap 5-15. Has bloating. Was on Bipap before.  3. Need OSA corrected so PLMD/RLS gets better.

## 2015-11-04 NOTE — Patient Instructions (Signed)
1. We will try to get you a new cpap machien if your insurance will cover. Otherwise, we will order a home sleep study. 2. Continue Mirapex. 3. We will order a chest ct scan.  4. Call back if with issues.  Return to clinic in 3-4 mos.

## 2015-11-04 NOTE — Assessment & Plan Note (Signed)
CT chest 09/2012:  42mm RLL nodule. Grew up in [place], minimal smoking history, no personal h/o cancer.  Cancer in grandparents.  Chest CT 2015:  No change.   Plan for chest ct scan to f/u. If no change, no need for further w/u.

## 2015-11-04 NOTE — Assessment & Plan Note (Signed)
On mirapex with +response. On 0.5 mg 3 tabs at hs.  Nausea to requip Hopefully better and cut down dose once osa is treated.

## 2015-11-04 NOTE — Progress Notes (Signed)
Subjective:    Patient ID: Zachary Dixon, male    DOB: April 08, 1966, 50 y.o.   MRN: [id number]  HPI Patient is here for f/u on his OSA, RLS.  ROV 11/04/15 Pt is here for f/u on his OSA. He broke his machine and is using a friend's CPAP machine. No download has been done.   Review of Systems  Constitutional: Negative.   HENT: Negative.   Eyes: Negative.   Respiratory: Negative.   Cardiovascular: Negative.   Gastrointestinal: Negative.   Endocrine: Negative.   Genitourinary: Negative.   Musculoskeletal: Negative.   Skin: Negative.   Allergic/Immunologic: Negative.   Neurological: Negative.   Hematological: Negative.   Psychiatric/Behavioral: Negative.   All other systems reviewed and are negative.  Past Medical History  Diagnosis Date  . Hyperlipidemia   . Allergy   . Sleep apnea   . Anxiety      Family History  Problem Relation Age of Onset  . Asthma Father   . Emphysema Father   . Heart disease Paternal Grandfather   . Heart disease Paternal Grandmother   . Breast cancer Maternal Grandmother   . Colon cancer Maternal Grandfather      Past Surgical History  Procedure Laterality Date  . No past surgeries      Social History   Social History  . Marital Status: Married    Spouse Name: N/A  . Number of Children: 1  . Years of Education: N/A   Occupational History  . marketing    Social History Main Topics  . Smoking status: Former Smoker -- 1.00 packs/day for 7 years    Types: Cigarettes    Quit date: 07/11/1993  . Smokeless tobacco: Never Used  . Alcohol Use: Yes     Comment: 1-2 drinks daily  . Drug Use: No  . Sexual Activity: Not on file   Other Topics Concern  . Not on file   Social History Narrative     No Known Allergies   Outpatient Prescriptions Prior to Visit  Medication Sig Dispense Refill  . pramipexole (MIRAPEX) 0.125 MG tablet 2 tabs after dinner (Patient taking differently: Take 0.25 mg by mouth daily after supper. ) 60 tablet  3  . traZODone (DESYREL) 50 MG tablet Take 50-150 mg by mouth at bedtime.    Marland Kitchen [name] 50 MG capsule Take 50 mg by mouth every morning.  0  . acetaminophen (TYLENOL) 500 MG tablet Take 1,000 mg by mouth every 6 (six) hours as needed (For pain.). Reported on 11/04/2015    . DULoxetine (CYMBALTA) 30 MG capsule Take 30 mg by mouth daily.    . TRINTELLIX 10 MG TABS Take 10 mg by mouth daily.  1   No facility-administered medications prior to visit.   Meds ordered this encounter  Medications  . DULoxetine (CYMBALTA) 60 MG capsule    Sig: Take 1 capsule by mouth daily.  Marland Kitchen lithium carbonate 300 MG capsule    Sig: Take 4 capsules by mouth. As directed    Refill:  1  . lurasidone (LATUDA) 80 MG TABS tablet    Sig: Take 80 mg by mouth daily with breakfast.          Objective:   Physical Exam   Vitals:  Filed Vitals:   11/04/15 1046  BP: 142/88  Pulse: 89  Height: 6\' 2"  (1.88 m)  Weight: 221 lb (100.245 kg)  SpO2: 96%    Constitutional/General:  Pleasant, well-nourished, well-developed, not in any distress,  Comfortably seating.  Well kempt  Body mass index is 28.36 kg/(m^2). Wt Readings from Last 3 Encounters:  11/04/15 221 lb (100.245 kg)  08/22/14 217 lb (98.431 kg)  08/19/13 216 lb 6.4 oz (98.158 kg)    Neck circumference: 16 in  HEENT: Pupils equal and reactive to light and accommodation. Anicteric sclerae. Normal nasal mucosa.   No oral  lesions,  mouth clear,  oropharynx clear, no postnasal drip. (-) Oral thrush. No dental caries.  Airway - Mallampati class III                                                                                                                                                            Neck: No masses. Midline trachea. No JVD, (-) LAD. (-) bruits appreciated.  Respiratory/Chest: Grossly normal chest. (-) deformity. (-) Accessory muscle use.  Symmetric expansion. (-) Tenderness on palpation.  Resonant on percussion.  Diminished BS on  both lower lung zones. (-) wheezing, crackles, rhonchi (-) egophony  Cardiovascular: Regular rate and  rhythm, heart sounds normal, no murmur or gallops, no peripheral edema  Gastrointestinal:  Normal bowel sounds. Soft, non-tender. No hepatosplenomegaly.  (-) masses.   Musculoskeletal:  Normal muscle tone. Normal gait.   Extremities: Grossly normal. (-) clubbing, cyanosis.  (-) edema  Skin: (-) rash,lesions seen.   Neurological/Psychiatric : alert, oriented to time, place, person. Normal mood and affect           Assessment & Plan:  OSA (obstructive sleep apnea) NPSG 2010:  AHI 9/hr, +PLMS with 2/hr with arousal and awakening.  His machine in 2010 is a Bipap but it is not working. Bipap was a lot of pressure. He uses a friend's cpap x 7 mos. He is on 7-13.   Pt has hypersomnia, snoring, witnessed apneas, fatigue on current machine. Bipap stopped working and is not delivering enough pressure.  Has  Bloating with BiPaP.   Plan : 1. HST. 2. Try autocpap 5-15. Has bloating. Was on Bipap before.  3. Need OSA corrected so PLMD/RLS gets better.   PLMD (periodic limb movement disorder) On mirapex with +response. On 0.5 mg 3 tabs at hs.  Nausea to requip Hopefully better and cut down dose once osa is treated.   Solitary pulmonary nodule CT chest 09/2012:  48mm RLL nodule. Grew up in [place], minimal smoking history, no personal h/o cancer.  Cancer in grandparents.  Chest CT 2015:  No change.   Plan for chest ct scan to f/u. If no change, no need for further w/u.     Return to clinic in 3-4 mos.   [name], MD 11/04/2015, 11:20 AM [facility] Pager [phone] After 3 pm or if no answer, call [phone]

## 2015-11-09 ENCOUNTER — Ambulatory Visit (INDEPENDENT_AMBULATORY_CARE_PROVIDER_SITE_OTHER)
Admission: RE | Admit: 2015-11-09 | Discharge: 2015-11-09 | Disposition: A | Discharge status: Home / Self Care | Payer: BLUE CROSS/BLUE SHIELD | Source: Ambulatory Visit | Attending: Pulmonary Disease | Admitting: Pulmonary Disease

## 2015-11-09 DIAGNOSIS — R911 Solitary pulmonary nodule: Secondary | ICD-10-CM

## 2015-11-10 ENCOUNTER — Telehealth: Payer: Self-pay | Admitting: Pulmonary Disease

## 2015-11-10 DIAGNOSIS — N200 Calculus of kidney: Secondary | ICD-10-CM

## 2015-11-10 NOTE — Telephone Encounter (Signed)
Referral placed.  Nothing further needed.  

## 2015-11-10 NOTE — Telephone Encounter (Signed)
Sheena, pls refer pt to a urologist. Thanks.  AD

## 2015-11-10 NOTE — Telephone Encounter (Signed)
Notes Recorded by Rush Landmark, MD on 11/09/2015 at 9:05 AM pls tell pt the spot in the right lower lung looks the same as 2015 ct scan. Stable. No need to follow up. Also, fyi, he has kidney stones --not sure if he knew about them. ---------------------------------- Patient is aware of results.  Patient states that he was not aware that he had Kidney stones and he would like to know if Dr. Corrie Dandy can refer him to a Urologist to have this checked out.   Dr. Murlean Iba, please advise if ok to refer to Urology.

## 2015-11-12 ENCOUNTER — Telehealth: Payer: Self-pay | Admitting: Pulmonary Disease

## 2015-11-12 NOTE — Telephone Encounter (Signed)
Spoke with pt's spouse, states she is returning a call to [facility] regarding a urology referral.   PCC's please advise.  Thanks.

## 2015-11-12 NOTE — Telephone Encounter (Signed)
Called back and spoke to wife she is aware of his appt 11/17/15 with dr [email or web address] Joellen [place]

## 2015-11-24 ENCOUNTER — Other Ambulatory Visit (HOSPITAL_COMMUNITY): Payer: BLUE CROSS/BLUE SHIELD

## 2015-11-26 ENCOUNTER — Telehealth: Payer: Self-pay | Admitting: Pulmonary Disease

## 2015-11-26 DIAGNOSIS — G4733 Obstructive sleep apnea (adult) (pediatric): Secondary | ICD-10-CM

## 2015-11-26 NOTE — Telephone Encounter (Signed)
  Please call the pt and tell the pt the [place]  showed OSA (moderate)   Pt stops breathing 17     times an hour.   Please order autoCPAP 5-15 cm H2O. Patient will need a mask fitting session. Patient will need a 1 month download.   Patient needs to be seen by me or any of the NPs/APPs  4-6 weeks after obtaining the cpap machine. Let me know if you receive this.   Thanks!   J. Shirl Harris, MD 11/26/2015, 4:23 PM

## 2015-11-27 ENCOUNTER — Other Ambulatory Visit: Payer: Self-pay | Admitting: *Deleted

## 2015-11-27 DIAGNOSIS — G4733 Obstructive sleep apnea (adult) (pediatric): Secondary | ICD-10-CM | POA: Diagnosis not present

## 2015-11-30 NOTE — Telephone Encounter (Signed)
LMTCB

## 2015-12-01 ENCOUNTER — Other Ambulatory Visit (HOSPITAL_COMMUNITY): Payer: BLUE CROSS/BLUE SHIELD | Attending: Psychiatry | Admitting: Psychiatry

## 2015-12-01 ENCOUNTER — Encounter (HOSPITAL_COMMUNITY): Payer: Self-pay | Admitting: Psychiatry

## 2015-12-01 DIAGNOSIS — F332 Major depressive disorder, recurrent severe without psychotic features: Secondary | ICD-10-CM | POA: Diagnosis not present

## 2015-12-01 NOTE — Progress Notes (Signed)
Psychiatric Initial Adult Assessment   Patient Identification: Zachary Dixon MRN:  [id number] Date of Evaluation:  12/01/2015 Referral Source: Dr Clovis Pu Chief Complaint:depression not responsive to medication   Visit Diagnosis:    ICD-9-CM ICD-10-CM   1. Severe recurrent major depression without psychotic features ([place]) 296.33 F33.2     History of Present Illness:  Mr Zachary Dixon says he has been depressed most of his life.  He has been on most of the antidepressants with initial success but nothing as long as a year or two.  He has tried augmentation of things such as seroquel, lamictal, lithium. Stimulants.  [name] has been helpful in that he can get things done but still feels depressed.  Feels sad, with decreased energy, focus, motivation and interest.  Guilt feelings, irritability, excessive sleeping, no pleasure in usual interests.  No suicidal thoughts.  No history of bipolar diagnosis though he takes mood stabilizers.  No metal implants or history of seizures.  Has good support from wife and family.  No psychosis.  Associated Signs/Symptoms: Depression Symptoms:  depressed mood, anhedonia, hypersomnia, fatigue, difficulty concentrating, impaired memory, anxiety, (Hypo) Manic Symptoms:  Irritable Mood, Anxiety Symptoms:  Excessive Worry, Psychotic Symptoms:  none PTSD Symptoms: Negative  Past Psychiatric History: as above  Previous Psychotropic Medications: Yes   Substance Abuse History in the last 12 months:  No.  Consequences of Substance Abuse: Negative  Past Medical History:  Past Medical History  Diagnosis Date  . Hyperlipidemia   . Allergy   . Sleep apnea   . Anxiety     Past Surgical History  Procedure Laterality Date  . No past surgeries      Family Psychiatric History: grandmother and maternal aunt with depression  Family History:  Family History  Problem Relation Age of Onset  . Asthma Father   . Emphysema Father   . Heart disease Paternal  Grandfather   . Heart disease Paternal Grandmother   . Breast cancer Maternal Grandmother   . Colon cancer Maternal Grandfather     Social History:   Social History   Social History  . Marital Status: Married    Spouse Name: N/A  . Number of Children: 1  . Years of Education: N/A   Occupational History  . marketing    Social History Main Topics  . Smoking status: Former Smoker -- 1.00 packs/day for 7 years    Types: Cigarettes    Quit date: 07/11/1993  . Smokeless tobacco: Never Used  . Alcohol Use: Yes     Comment: 1-2 drinks daily  . Drug Use: No  . Sexual Activity: Not Asked   Other Topics Concern  . None   Social History Narrative    Additional Social History: none  Allergies:  No Known Allergies  Metabolic Disorder Labs: No results found for: HGBA1C, MPG No results found for: PROLACTIN No results found for: CHOL, TRIG, HDL, CHOLHDL, VLDL, LDLCALC   Current Medications: Current Outpatient Prescriptions  Medication Sig Dispense Refill  . acetaminophen (TYLENOL) 500 MG tablet Take 1,000 mg by mouth every 6 (six) hours as needed (For pain.). Reported on 11/04/2015    . DULoxetine (CYMBALTA) 60 MG capsule Take 1 capsule by mouth daily.    Marland Kitchen lithium carbonate 300 MG capsule Take 4 capsules by mouth. As directed  1  . lurasidone (LATUDA) 80 MG TABS tablet Take 80 mg by mouth daily with breakfast.    . pramipexole (MIRAPEX) 0.125 MG tablet 2 tabs after dinner (Patient taking differently:  Take 0.25 mg by mouth daily after supper. ) 60 tablet 3  . traZODone (DESYREL) 50 MG tablet Take 50-150 mg by mouth at bedtime.    Marland Kitchen [name] 50 MG capsule Take 50 mg by mouth every morning.  0   No current facility-administered medications for this visit.    Neurologic: Headache: Negative Seizure: Negative Paresthesias:Negative  Musculoskeletal: Strength & Muscle Tone: within normal limits Gait & Station: normal Patient leans: N/A  Psychiatric Specialty Exam: ROS   There were no vitals taken for this visit.There is no weight on file to calculate BMI.  General Appearance: Well Groomed  Eye Contact:  Good  Speech:  Clear and Coherent  Volume:  Normal  Mood:  Depressed  Affect:  Congruent  Thought Process:  Coherent  Orientation:  Full (Time, Place, and Person)  Thought Content:  Negative  Suicidal Thoughts:  No  Homicidal Thoughts:  No  Memory:  Immediate;   Good Recent;   Good Remote;   Good  Judgement:  Good  Insight:  Good  Psychomotor Activity:  Normal  Concentration:  Concentration: Good and Attention Span: Good  Recall:  Good  Fund of Knowledge:Good  Language: Good  Akathisia:  Negative  Handed:  Right  AIMS (if indicated):  0  Assets:  Communication Skills Desire for Improvement Financial Resources/Insurance Housing Intimacy Leisure Time Physical Health Resilience Social Support Talents/Skills Transportation Vocational/Educational  ADL's:  Intact  Cognition: WNL  Sleep:  Excessive at times    Treatment Plan Summary: qualifies for [place] when he is ready to start.   Donnelly Angelica, MD 5/23/20173:27 PM

## 2015-12-04 NOTE — Telephone Encounter (Signed)
Spoke with pt and gave results and recommendations. Pt agrees to start CPAP therapy. Order placed. Pt aware to call office and schedule f/u appt once starts CPAP. Nothing further needed.  

## 2015-12-04 NOTE — Telephone Encounter (Signed)
Patient returning our call - please call him on his cell phone [phone]

## 2015-12-31 ENCOUNTER — Other Ambulatory Visit (HOSPITAL_COMMUNITY): Payer: Self-pay | Attending: Psychiatry | Admitting: Psychiatry

## 2015-12-31 ENCOUNTER — Other Ambulatory Visit (HOSPITAL_COMMUNITY): Payer: Self-pay

## 2015-12-31 ENCOUNTER — Encounter (HOSPITAL_COMMUNITY): Payer: Self-pay

## 2015-12-31 DIAGNOSIS — F329 Major depressive disorder, single episode, unspecified: Secondary | ICD-10-CM | POA: Insufficient documentation

## 2015-12-31 DIAGNOSIS — F332 Major depressive disorder, recurrent severe without psychotic features: Secondary | ICD-10-CM

## 2015-12-31 NOTE — Progress Notes (Signed)
Patient ID: Zachary Dixon, male   DOB: 1965/09/01, 50 y.o.   MRN: [id number] Mr Hundley participated in motor threshold mapping without issues.  Parameters were SOA 33, A/P 9.5, coil angle -5 degrees and motor threshold 1.15.

## 2015-12-31 NOTE — Progress Notes (Signed)
Pt reported to [facility] for cortical mapping and motor threshold determination for Repetitive Transcranial Magnetic Stimulation treatment for Major Depressive Disorder. Pt was accompanied by wife for moral support. Pt completed a PHQ-9 with a score of 21 ( severe depression). Pt also completed a Beck's Depression Inventory with a score of 38 (severe depression). Prior to procedure, pt signed an informed consent agreement for [place] treatment. Pt's treatment area was found by applying single pulses to her left motor cortex, hunting along the anterior/posterior plane and along the superior oblique angle until the best motor response was elicited from the pt's right thumb. The best response was observed at 4.0 A/P and 30 degrees SOA, with a coil angle of -5 degrees. Pt's motor threshold was calculated using the [employer]'s proprietary MT Assist algorithm, which produced a calculated motor threshold of 1.23 SMT. Per these findings, pt's treatment parameters are as follows: A/P -- 9.5 cm, SOA -- 33 degrees, Coil Angle -- -5  degrees, Motor Threshold -- 1.15 SMT. With these parameters, the pt will receive 36 sessions of TMS according to the following protocol: 3000 pulses per session, with stimulation in bursts of pulses lasting 4 seconds at a frequency of 10 Hz, separated by 26 seconds of rest. After determining pt's tx parameters, coil was moved to the treatment location, and the first burst of pulses was applied at a reduced power of 80%MT. Pt reported no complaints, and stated that the stimulation was tolerable, so the first tx session was given to the pt. Stimulation power was gradually titrated up from 80%MT to 120%MT. Pt tolerated tx well. Pt and wife left with no complaints post-tx. Pt and wife departed from clinic without issue.

## 2016-01-01 ENCOUNTER — Other Ambulatory Visit (HOSPITAL_COMMUNITY): Payer: Self-pay

## 2016-01-01 ENCOUNTER — Other Ambulatory Visit (INDEPENDENT_AMBULATORY_CARE_PROVIDER_SITE_OTHER): Payer: Self-pay | Admitting: Emergency Medicine

## 2016-01-01 DIAGNOSIS — F332 Major depressive disorder, recurrent severe without psychotic features: Secondary | ICD-10-CM

## 2016-01-01 NOTE — Progress Notes (Signed)
Pt reported to [facility] for Repetitive Transcranial Magnetic Stimulation treatment for Major Depressive Disorder. Pt presented with pleasant affect. Pt reported no change in alcohol/substance use, caffeine consumption, sleep pattern or metal implant status since previous tx. Pt watched TV during treatment. Power kept at 120% for the duration of tx.Pt with no complaints post-tx. Pt left clinic with no problems or issues.

## 2016-01-04 ENCOUNTER — Other Ambulatory Visit (HOSPITAL_COMMUNITY): Payer: Self-pay

## 2016-01-04 ENCOUNTER — Other Ambulatory Visit (INDEPENDENT_AMBULATORY_CARE_PROVIDER_SITE_OTHER): Payer: Self-pay

## 2016-01-04 DIAGNOSIS — F332 Major depressive disorder, recurrent severe without psychotic features: Secondary | ICD-10-CM

## 2016-01-04 NOTE — Progress Notes (Signed)
Pt reported to [facility] for Repetitive Transcranial Magnetic Stimulation treatment for Major Depressive Disorder. Pt presented with pleasant affect. Pt reported no change in alcohol/substance use, caffeine consumption, sleep pattern or metal implant status since previous tx. Pt watched TV during treatment. Power kept at 120% for the duration of tx.Pt with no complaints post-tx. Pt left clinic with no problems or issues.

## 2016-01-05 ENCOUNTER — Other Ambulatory Visit (INDEPENDENT_AMBULATORY_CARE_PROVIDER_SITE_OTHER): Payer: Self-pay

## 2016-01-05 ENCOUNTER — Other Ambulatory Visit (HOSPITAL_COMMUNITY): Payer: Self-pay

## 2016-01-05 DIAGNOSIS — F332 Major depressive disorder, recurrent severe without psychotic features: Secondary | ICD-10-CM

## 2016-01-05 NOTE — Progress Notes (Signed)
Pt reported to [facility] for Repetitive Transcranial Magnetic Stimulation treatment for Major Depressive Disorder. Pt presented with pleasant affect. Pt reported no change in alcohol/substance use, caffeine consumption, sleep pattern or metal implant status since previous tx. Pt watched TV during treatment. Power kept at 120% for the duration of tx.Pt with no complaints post-tx. Pt left clinic with no problems or issues.

## 2016-01-06 ENCOUNTER — Other Ambulatory Visit (INDEPENDENT_AMBULATORY_CARE_PROVIDER_SITE_OTHER): Payer: Self-pay

## 2016-01-06 ENCOUNTER — Other Ambulatory Visit (HOSPITAL_COMMUNITY): Payer: Self-pay

## 2016-01-06 DIAGNOSIS — F332 Major depressive disorder, recurrent severe without psychotic features: Secondary | ICD-10-CM

## 2016-01-06 NOTE — Progress Notes (Signed)
Pt reported to [facility] for Repetitive Transcranial Magnetic Stimulation treatment for Major Depressive Disorder. Pt presented with pleasant affect. Pt reported no change in alcohol/substance use, caffeine consumption, sleep pattern or metal implant status since previous tx. Pt watched TV during treatment. Power kept at 120% for the duration of tx.Pt with no complaints post-tx. Pt left clinic with no problems or issues.

## 2016-01-07 ENCOUNTER — Other Ambulatory Visit (HOSPITAL_COMMUNITY): Payer: Self-pay

## 2016-01-07 ENCOUNTER — Other Ambulatory Visit (INDEPENDENT_AMBULATORY_CARE_PROVIDER_SITE_OTHER): Payer: Self-pay

## 2016-01-07 DIAGNOSIS — F332 Major depressive disorder, recurrent severe without psychotic features: Secondary | ICD-10-CM

## 2016-01-07 NOTE — Progress Notes (Signed)
Pt reported to [facility] for Repetitive Transcranial Magnetic Stimulation treatment for Major Depressive Disorder. Pt presented with pleasant affect. Pt reported no change in alcohol/substance use, caffeine consumption, sleep pattern or metal implant status since previous tx. Pt watched TV during treatment. Pt completed PH-9, totaling 12. Power kept at 120% for the duration of tx.Pt with no complaints post-tx. Pt left clinic with no problems or issues.

## 2016-01-08 ENCOUNTER — Other Ambulatory Visit (INDEPENDENT_AMBULATORY_CARE_PROVIDER_SITE_OTHER): Payer: Self-pay

## 2016-01-08 ENCOUNTER — Other Ambulatory Visit (HOSPITAL_COMMUNITY): Payer: Self-pay

## 2016-01-08 DIAGNOSIS — F332 Major depressive disorder, recurrent severe without psychotic features: Secondary | ICD-10-CM

## 2016-01-08 NOTE — Progress Notes (Signed)
Pt reported to [facility] for Repetitive Transcranial Magnetic Stimulation treatment for Major Depressive Disorder. Pt presented with pleasant affect. Pt reported no change in alcohol/substance use, caffeine consumption, sleep pattern or metal implant status since previous tx. Pt watched TV during treatment. Pt completed PH-9, totaling 12. Power kept at 120% for the duration of tx.Pt with no complaints post-tx. Pt left clinic with no problems or issues.

## 2016-01-11 ENCOUNTER — Other Ambulatory Visit (HOSPITAL_COMMUNITY): Payer: Self-pay | Attending: Psychiatry

## 2016-01-11 ENCOUNTER — Other Ambulatory Visit (HOSPITAL_COMMUNITY): Payer: Self-pay

## 2016-01-11 ENCOUNTER — Encounter (HOSPITAL_COMMUNITY): Payer: Self-pay

## 2016-01-11 DIAGNOSIS — F332 Major depressive disorder, recurrent severe without psychotic features: Secondary | ICD-10-CM

## 2016-01-11 DIAGNOSIS — F329 Major depressive disorder, single episode, unspecified: Secondary | ICD-10-CM | POA: Insufficient documentation

## 2016-01-11 NOTE — Progress Notes (Signed)
Pt reported to [facility] for Repetitive Transcranial Magnetic Stimulation treatment for Major Depressive Disorder. Pt presented with pleasant affect. Pt reported no change in alcohol/substance use, caffeine consumption, sleep pattern or metal implant status since previous tx. Pt watched TV during treatment. Power kept at 120% for the duration of tx.Pt with no complaints post-tx. Pt left clinic with no problems or issues.

## 2016-01-12 ENCOUNTER — Other Ambulatory Visit (HOSPITAL_COMMUNITY): Payer: Self-pay

## 2016-01-13 ENCOUNTER — Other Ambulatory Visit (INDEPENDENT_AMBULATORY_CARE_PROVIDER_SITE_OTHER): Payer: Self-pay

## 2016-01-13 ENCOUNTER — Other Ambulatory Visit (HOSPITAL_COMMUNITY): Payer: Self-pay

## 2016-01-13 DIAGNOSIS — F332 Major depressive disorder, recurrent severe without psychotic features: Secondary | ICD-10-CM

## 2016-01-13 NOTE — Progress Notes (Signed)
Pt reported to [facility] for Repetitive Transcranial Magnetic Stimulation treatment for Major Depressive Disorder. Pt presented with pleasant affect. Pt reported no change in alcohol/substance use, caffeine consumption, sleep pattern or metal implant status since previous tx. Pt watched TV during treatment. Power kept at 120% for the duration of tx.Pt with no complaints post-tx. Pt left clinic with no problems or issues.

## 2016-01-14 ENCOUNTER — Other Ambulatory Visit (HOSPITAL_COMMUNITY): Payer: Self-pay

## 2016-01-14 ENCOUNTER — Other Ambulatory Visit (INDEPENDENT_AMBULATORY_CARE_PROVIDER_SITE_OTHER): Payer: Self-pay

## 2016-01-14 DIAGNOSIS — F332 Major depressive disorder, recurrent severe without psychotic features: Secondary | ICD-10-CM

## 2016-01-14 NOTE — Progress Notes (Signed)
Pt reported to [facility] for Repetitive Transcranial Magnetic Stimulation treatment for Major Depressive Disorder. Pt presented with pleasant affect. Pt reported no change in alcohol/substance use, caffeine consumption, sleep pattern or metal implant status since previous tx. Pt watched TV during treatment. Power kept at 120% for the duration of tx.Pt with no complaints post-tx. Pt left clinic with no problems or issues.

## 2016-01-14 NOTE — Progress Notes (Signed)
Pt reported to [facility] for Repetitive Transcranial Magnetic Stimulation treatment for Major Depressive Disorder. Pt presented with pleasant affect. Pt reported no change in alcohol/substance use, caffeine consumption, sleep pattern or metal implant status since previous tx. Pt watched TV during treatment. Pt completed the PHQ-9, totaling 1. Power kept at 120% for the duration of tx.Pt with no complaints post-tx. Pt left clinic with no problems or issues.

## 2016-01-15 ENCOUNTER — Other Ambulatory Visit (HOSPITAL_COMMUNITY): Payer: Self-pay

## 2016-01-15 ENCOUNTER — Encounter (HOSPITAL_COMMUNITY): Payer: Self-pay

## 2016-01-15 ENCOUNTER — Other Ambulatory Visit (INDEPENDENT_AMBULATORY_CARE_PROVIDER_SITE_OTHER): Payer: Self-pay

## 2016-01-15 DIAGNOSIS — F332 Major depressive disorder, recurrent severe without psychotic features: Secondary | ICD-10-CM

## 2016-01-15 NOTE — Progress Notes (Signed)
Pt reported to [facility] for Repetitive Transcranial Magnetic Stimulation treatment for Major Depressive Disorder. Pt presented with pleasant affect. Pt reported no change in alcohol/substance use, caffeine consumption, sleep pattern or metal implant status since previous tx. Pt watched TV and engaged in conversation during treatment. Power kept at 120% the duration of tx.Pt with no complaints post-tx. Pt left clinic with no problems or issues.

## 2016-01-18 ENCOUNTER — Other Ambulatory Visit (INDEPENDENT_AMBULATORY_CARE_PROVIDER_SITE_OTHER): Payer: Self-pay | Admitting: Emergency Medicine

## 2016-01-18 ENCOUNTER — Other Ambulatory Visit (HOSPITAL_COMMUNITY): Payer: Self-pay

## 2016-01-18 DIAGNOSIS — F332 Major depressive disorder, recurrent severe without psychotic features: Secondary | ICD-10-CM

## 2016-01-18 NOTE — Progress Notes (Signed)
Pt reported to [facility] for Repetitive Transcranial Magnetic Stimulation treatment for Major Depressive Disorder. Pt presented with pleasant affect. Pt reported no change in alcohol/substance use, caffeine consumption, sleep pattern or metal implant status since previous tx. Pt watched TV and engaged in conversation during treatment. Power kept at 120% the duration of tx.Pt with no complaints post-tx. Pt left clinic with no problems or issues.

## 2016-01-19 ENCOUNTER — Other Ambulatory Visit (HOSPITAL_COMMUNITY): Payer: Self-pay

## 2016-01-19 ENCOUNTER — Other Ambulatory Visit (INDEPENDENT_AMBULATORY_CARE_PROVIDER_SITE_OTHER): Payer: Self-pay

## 2016-01-19 DIAGNOSIS — F332 Major depressive disorder, recurrent severe without psychotic features: Secondary | ICD-10-CM

## 2016-01-19 NOTE — Progress Notes (Signed)
Pt reported to [facility] for Repetitive Transcranial Magnetic Stimulation treatment for Major Depressive Disorder. Pt presented with pleasant affect. Pt reported no change in alcohol/substance use, caffeine consumption, sleep pattern or metal implant status since previous tx. Pt watched TV and engaged in conversation during treatment. Power kept at 120% the duration of tx.Pt with no complaints post-tx. Pt left clinic with no problems or issues.

## 2016-01-20 ENCOUNTER — Other Ambulatory Visit (HOSPITAL_COMMUNITY): Payer: Self-pay

## 2016-01-20 ENCOUNTER — Other Ambulatory Visit (INDEPENDENT_AMBULATORY_CARE_PROVIDER_SITE_OTHER): Payer: Self-pay | Admitting: Emergency Medicine

## 2016-01-20 DIAGNOSIS — F332 Major depressive disorder, recurrent severe without psychotic features: Secondary | ICD-10-CM

## 2016-01-20 NOTE — Progress Notes (Signed)
Pt reported to [facility] for Repetitive Transcranial Magnetic Stimulation treatment for Major Depressive Disorder. Pt presented with pleasant affect. Pt reported no change in alcohol/substance use, caffeine consumption, sleep pattern or metal implant status since previous tx. Pt watched TV and engaged in conversation during treatment. Patient completed PHQ-9, totaling 4. Power kept at 120% the duration of tx.Pt with no complaints post-tx. Pt left clinic with no problems or issues.

## 2016-01-21 ENCOUNTER — Other Ambulatory Visit (INDEPENDENT_AMBULATORY_CARE_PROVIDER_SITE_OTHER): Payer: Self-pay | Admitting: Emergency Medicine

## 2016-01-21 ENCOUNTER — Other Ambulatory Visit (HOSPITAL_COMMUNITY): Payer: Self-pay

## 2016-01-21 DIAGNOSIS — F332 Major depressive disorder, recurrent severe without psychotic features: Secondary | ICD-10-CM

## 2016-01-21 NOTE — Progress Notes (Signed)
Pt reported to [facility] for Repetitive Transcranial Magnetic Stimulation treatment for Major Depressive Disorder. Pt presented with pleasant affect. Pt reported no change in alcohol/substance use, caffeine consumption, sleep pattern or metal implant status since previous tx. Pt watched TV and engaged in conversation during treatment. Power kept at 120% the duration of tx.Pt with no complaints post-tx. Pt left clinic with no problems or issues.

## 2016-01-22 ENCOUNTER — Other Ambulatory Visit (HOSPITAL_COMMUNITY): Payer: Self-pay

## 2016-01-22 ENCOUNTER — Other Ambulatory Visit (INDEPENDENT_AMBULATORY_CARE_PROVIDER_SITE_OTHER): Payer: BLUE CROSS/BLUE SHIELD

## 2016-01-22 DIAGNOSIS — F332 Major depressive disorder, recurrent severe without psychotic features: Secondary | ICD-10-CM | POA: Diagnosis not present

## 2016-01-22 NOTE — Progress Notes (Signed)
Pt reported to [facility] for Repetitive Transcranial Magnetic Stimulation treatment for Major Depressive Disorder. Pt presented with pleasant affect. Pt reported no change in alcohol/substance use, caffeine consumption, sleep pattern or metal implant status since previous tx. Pt watched TV and engaged in conversation during treatment. Power kept at 120% the duration of tx.Pt with no complaints post-tx. Pt left clinic with no problems or issues.

## 2016-01-25 ENCOUNTER — Other Ambulatory Visit (INDEPENDENT_AMBULATORY_CARE_PROVIDER_SITE_OTHER): Payer: BLUE CROSS/BLUE SHIELD

## 2016-01-25 ENCOUNTER — Other Ambulatory Visit (HOSPITAL_COMMUNITY): Payer: Self-pay

## 2016-01-25 DIAGNOSIS — F332 Major depressive disorder, recurrent severe without psychotic features: Secondary | ICD-10-CM

## 2016-01-25 NOTE — Progress Notes (Signed)
Pt reported to [facility] for Repetitive Transcranial Magnetic Stimulation treatment for Major Depressive Disorder. Pt presented with pleasant affect. Pt reported no change in alcohol/substance use, caffeine consumption, sleep pattern or metal implant status since previous tx. Pt watched TV during treatment. Power kept at 120% the duration of tx.Pt with no complaints post-tx. Pt left clinic with no problems or issues.

## 2016-01-26 ENCOUNTER — Other Ambulatory Visit (HOSPITAL_COMMUNITY): Payer: Self-pay

## 2016-01-26 ENCOUNTER — Other Ambulatory Visit (INDEPENDENT_AMBULATORY_CARE_PROVIDER_SITE_OTHER): Payer: BLUE CROSS/BLUE SHIELD | Admitting: Emergency Medicine

## 2016-01-26 DIAGNOSIS — F332 Major depressive disorder, recurrent severe without psychotic features: Secondary | ICD-10-CM

## 2016-01-26 NOTE — Progress Notes (Signed)
Pt reported to [facility] for Repetitive Transcranial Magnetic Stimulation treatment for Major Depressive Disorder. Pt presented with pleasant affect. Pt reported no change in alcohol/substance use, caffeine consumption, sleep pattern or metal implant status since previous tx. Pt watched TV during treatment. Pt stated that he has been feeling a positive difference in his daily routine. Power kept at 120% the duration of tx.Pt with no complaints post-tx. Pt left clinic with no problems or issues.

## 2016-01-26 NOTE — Addendum Note (Signed)
Addended by: Dionne Bucy on: 01/26/2016 08:34 AM   Modules accepted: Level of Service

## 2016-01-27 ENCOUNTER — Other Ambulatory Visit (INDEPENDENT_AMBULATORY_CARE_PROVIDER_SITE_OTHER): Payer: Self-pay | Admitting: Emergency Medicine

## 2016-01-27 ENCOUNTER — Other Ambulatory Visit (HOSPITAL_COMMUNITY): Payer: Self-pay

## 2016-01-27 DIAGNOSIS — F332 Major depressive disorder, recurrent severe without psychotic features: Secondary | ICD-10-CM

## 2016-01-27 NOTE — Progress Notes (Signed)
Pt reported to [facility] for Repetitive Transcranial Magnetic Stimulation treatment for Major Depressive Disorder. Pt presented with pleasant affect. Pt reported no change in alcohol/substance use, caffeine consumption, sleep pattern or metal implant status since previous tx. Pt watched TV during treatment. Pt stated that he has been feeling a positive difference in his daily routine. Pt completed the PHQ-9 and scored a 4. Power kept at 120% the duration of tx.Pt with no complaints post-tx. Pt left clinic with no problems or issues.

## 2016-01-28 ENCOUNTER — Other Ambulatory Visit (INDEPENDENT_AMBULATORY_CARE_PROVIDER_SITE_OTHER): Payer: BLUE CROSS/BLUE SHIELD

## 2016-01-28 ENCOUNTER — Other Ambulatory Visit (HOSPITAL_COMMUNITY): Payer: Self-pay

## 2016-01-28 DIAGNOSIS — F332 Major depressive disorder, recurrent severe without psychotic features: Secondary | ICD-10-CM

## 2016-01-28 NOTE — Progress Notes (Signed)
Pt reported to [facility] for Repetitive Transcranial Magnetic Stimulation treatment for Major Depressive Disorder. Pt presented with pleasant affect. Pt reported no change in alcohol/substance use, caffeine consumption, sleep pattern or metal implant status since previous tx. Pt watched TV during treatment.  Power kept at 120% the duration of tx.Pt with no complaints post-tx. Pt left clinic with no problems or issues.

## 2016-01-29 ENCOUNTER — Other Ambulatory Visit (HOSPITAL_COMMUNITY): Payer: Self-pay

## 2016-01-29 ENCOUNTER — Other Ambulatory Visit (INDEPENDENT_AMBULATORY_CARE_PROVIDER_SITE_OTHER): Payer: BLUE CROSS/BLUE SHIELD

## 2016-01-29 DIAGNOSIS — F332 Major depressive disorder, recurrent severe without psychotic features: Secondary | ICD-10-CM

## 2016-01-29 NOTE — Progress Notes (Signed)
Pt reported to [facility] for Repetitive Transcranial Magnetic Stimulation treatment for Major Depressive Disorder. Pt presented with pleasant affect. Pt reported no change in alcohol/substance use, caffeine consumption, sleep pattern or metal implant status since previous tx. Pt watched TV during treatment and engaged in conversation during tx. Power kept at 120% the duration of tx.Pt with no complaints post-tx. Pt left clinic with no problems or issues.

## 2016-02-01 ENCOUNTER — Other Ambulatory Visit (HOSPITAL_COMMUNITY): Payer: Self-pay

## 2016-02-01 ENCOUNTER — Other Ambulatory Visit (INDEPENDENT_AMBULATORY_CARE_PROVIDER_SITE_OTHER): Payer: Self-pay

## 2016-02-01 DIAGNOSIS — F332 Major depressive disorder, recurrent severe without psychotic features: Secondary | ICD-10-CM

## 2016-02-01 NOTE — Progress Notes (Signed)
Patient ID: Zachary Dixon, male   DOB: 1965/10/10, 50 y.o.   MRN: [id number] Pt reported to [facility] for Repetitive Transcranial Magnetic Stimulation treatment for Major Depressive Disorder. Pt presented with pleasant affect. Pt reported no change in alcohol/substance use, caffeine consumption, sleep pattern or metal implant status since previous tx. Pt watched TV during treatment.  Power kept at 120% the duration of tx.Pt with no complaints post-tx. Pt left clinic with no problems or issues.

## 2016-02-02 ENCOUNTER — Other Ambulatory Visit (HOSPITAL_COMMUNITY): Payer: Self-pay

## 2016-02-02 ENCOUNTER — Other Ambulatory Visit (INDEPENDENT_AMBULATORY_CARE_PROVIDER_SITE_OTHER): Payer: Self-pay | Admitting: Emergency Medicine

## 2016-02-02 DIAGNOSIS — F332 Major depressive disorder, recurrent severe without psychotic features: Secondary | ICD-10-CM

## 2016-02-02 NOTE — Progress Notes (Signed)
Pt reported to [facility] for Repetitive Transcranial Magnetic Stimulation treatment for Major Depressive Disorder. Pt presented with pleasant affect. Pt reported no change in alcohol/substance use, caffeine consumption, sleep pattern or metal implant status since previous tx. Pt watched TV during treatment. Power kept at 120% the duration of tx.Pt with no complaints post-tx. Pt left clinic with no problems or issues.

## 2016-02-03 ENCOUNTER — Other Ambulatory Visit (HOSPITAL_COMMUNITY): Payer: Self-pay

## 2016-02-03 ENCOUNTER — Other Ambulatory Visit (INDEPENDENT_AMBULATORY_CARE_PROVIDER_SITE_OTHER): Payer: Self-pay | Admitting: Emergency Medicine

## 2016-02-03 DIAGNOSIS — F332 Major depressive disorder, recurrent severe without psychotic features: Secondary | ICD-10-CM

## 2016-02-03 NOTE — Progress Notes (Signed)
Pt reported to [facility] for Repetitive Transcranial Magnetic Stimulation treatment for Major Depressive Disorder. Pt presented with pleasant affect. Pt reported no change in alcohol/substance use, caffeine consumption, sleep pattern or metal implant status since previous tx. Pt watched TV during treatment. Pt completed the PHQ-9 and scored a 3. Power kept at 120% the duration of tx.Pt with no complaints post-tx. Pt left clinic with no problems or issues.

## 2016-02-04 ENCOUNTER — Other Ambulatory Visit (HOSPITAL_COMMUNITY): Payer: Self-pay

## 2016-02-04 ENCOUNTER — Other Ambulatory Visit (INDEPENDENT_AMBULATORY_CARE_PROVIDER_SITE_OTHER): Payer: Self-pay

## 2016-02-04 DIAGNOSIS — F332 Major depressive disorder, recurrent severe without psychotic features: Secondary | ICD-10-CM

## 2016-02-04 NOTE — Progress Notes (Signed)
Pt reported to [facility] for Repetitive Transcranial Magnetic Stimulation treatment for Major Depressive Disorder. Pt presented with pleasant affect. Pt reported no change in alcohol/substance use, caffeine consumption, sleep pattern or metal implant status since previous tx. Pt watched TV during treatment.  Power kept at 120% the duration of tx.Pt with no complaints post-tx. Pt left clinic with no problems or issues.

## 2016-02-05 ENCOUNTER — Encounter (HOSPITAL_COMMUNITY): Payer: Self-pay

## 2016-02-05 ENCOUNTER — Other Ambulatory Visit (INDEPENDENT_AMBULATORY_CARE_PROVIDER_SITE_OTHER): Payer: Self-pay | Admitting: Emergency Medicine

## 2016-02-05 ENCOUNTER — Other Ambulatory Visit (HOSPITAL_COMMUNITY): Payer: Self-pay

## 2016-02-05 DIAGNOSIS — F332 Major depressive disorder, recurrent severe without psychotic features: Secondary | ICD-10-CM

## 2016-02-05 NOTE — Progress Notes (Signed)
Pt reported to [facility] for Repetitive Transcranial Magnetic Stimulation treatment for Major Depressive Disorder. Pt presented with pleasant affect. Pt reported no change in alcohol/substance use, caffeine consumption, sleep pattern or metal implant status since previous tx. Pt watched TV during treatment. Power kept at 120% the duration of tx.Pt with no complaints post-tx. Pt left clinic with no problems or issues.

## 2016-02-08 ENCOUNTER — Other Ambulatory Visit (INDEPENDENT_AMBULATORY_CARE_PROVIDER_SITE_OTHER): Payer: Self-pay

## 2016-02-08 ENCOUNTER — Other Ambulatory Visit (HOSPITAL_COMMUNITY): Payer: Self-pay

## 2016-02-08 DIAGNOSIS — F332 Major depressive disorder, recurrent severe without psychotic features: Secondary | ICD-10-CM

## 2016-02-08 NOTE — Progress Notes (Signed)
Patient ID: Zachary Dixon, male   DOB: 1965-09-20, 50 y.o.   MRN: [id number] Pt reported to [facility] for Repetitive Transcranial Magnetic Stimulation treatment for Major Depressive Disorder. Pt presented with pleasant affect. Pt reported no change in alcohol/substance use, caffeine consumption, sleep pattern or metal implant status since previous tx. Pt watched TV and engaged in conversation during tx. Pt stated that he had a good weekend and has been feeling good.Power kept at 120% the duration of tx.Pt with no complaints post-tx. Pt left clinic with no problems or issues.

## 2016-02-09 ENCOUNTER — Other Ambulatory Visit (HOSPITAL_COMMUNITY): Payer: Self-pay | Attending: Psychiatry

## 2016-02-09 ENCOUNTER — Other Ambulatory Visit (HOSPITAL_COMMUNITY): Payer: Self-pay

## 2016-02-09 DIAGNOSIS — F332 Major depressive disorder, recurrent severe without psychotic features: Secondary | ICD-10-CM

## 2016-02-09 DIAGNOSIS — F329 Major depressive disorder, single episode, unspecified: Secondary | ICD-10-CM | POA: Insufficient documentation

## 2016-02-09 NOTE — Progress Notes (Signed)
Patient ID: Zachary Dixon, male   DOB: 1966/07/03, 50 y.o.   MRN: [id number] Pt reported to [facility] for Repetitive Transcranial Magnetic Stimulation treatment for Major Depressive Disorder. Pt presented with pleasant affect. Pt reported no change in alcohol/substance use, caffeine consumption, sleep pattern or metal implant status since previous tx. Pt watched TV and engaged in conversation during tx. Pt stated that he had a good weekend and has been feeling good.Power kept at 120% the duration of tx.Pt with no complaints post-tx. Pt left clinic with no problems or issues.

## 2016-02-10 ENCOUNTER — Other Ambulatory Visit (INDEPENDENT_AMBULATORY_CARE_PROVIDER_SITE_OTHER): Payer: Self-pay

## 2016-02-10 ENCOUNTER — Other Ambulatory Visit (HOSPITAL_COMMUNITY): Payer: Self-pay

## 2016-02-10 DIAGNOSIS — F332 Major depressive disorder, recurrent severe without psychotic features: Secondary | ICD-10-CM

## 2016-02-10 NOTE — Progress Notes (Signed)
Patient ID: Zachary Dixon, male   DOB: February 03, 1966, 50 y.o.   MRN: [id number] Pt reported to [facility] for Repetitive Transcranial Magnetic Stimulation treatment for Major Depressive Disorder. Pt presented with pleasant affect. Pt reported no change in alcohol/substance use, caffeine consumption, sleep pattern or metal implant status since previous tx. Pt watched TV and engaged in conversation during tx. Pt stated that work has been very hectic but overall he still feels good. Pt stated that he wants to start running and exercising again because it makes him feel a lot better. Pt completed PHQ-9, totaling 3.Power kept at 120% the duration of tx.Pt with no complaints post-tx. Pt left clinic with no problems or issues.

## 2016-02-11 ENCOUNTER — Other Ambulatory Visit (HOSPITAL_COMMUNITY): Payer: Self-pay

## 2016-02-11 ENCOUNTER — Ambulatory Visit (INDEPENDENT_AMBULATORY_CARE_PROVIDER_SITE_OTHER): Payer: BLUE CROSS/BLUE SHIELD | Admitting: Pulmonary Disease

## 2016-02-11 ENCOUNTER — Encounter: Payer: Self-pay | Admitting: Pulmonary Disease

## 2016-02-11 ENCOUNTER — Other Ambulatory Visit (INDEPENDENT_AMBULATORY_CARE_PROVIDER_SITE_OTHER): Payer: Self-pay

## 2016-02-11 DIAGNOSIS — G4733 Obstructive sleep apnea (adult) (pediatric): Secondary | ICD-10-CM

## 2016-02-11 DIAGNOSIS — F332 Major depressive disorder, recurrent severe without psychotic features: Secondary | ICD-10-CM

## 2016-02-11 DIAGNOSIS — G4761 Periodic limb movement disorder: Secondary | ICD-10-CM

## 2016-02-11 DIAGNOSIS — R911 Solitary pulmonary nodule: Secondary | ICD-10-CM

## 2016-02-11 NOTE — Assessment & Plan Note (Signed)
Seems to be stable. Not interfering with cpap use.

## 2016-02-11 NOTE — Progress Notes (Signed)
Patient ID: Zachary Dixon, male   DOB: 10/03/1965, 50 y.o.   MRN: [id number] Pt reported to [facility] for Repetitive Transcranial Magnetic Stimulation treatment for Major Depressive Disorder. Pt presented with pleasant affect. Pt reported no change in alcohol/substance use, caffeine consumption, sleep pattern or metal implant status since previous tx. Pt watched TV and engaged in conversation during tx. Power kept at 120% the duration of tx.Pt with no complaints post-tx. Pt left clinic with no problems or issues.

## 2016-02-11 NOTE — Addendum Note (Signed)
Addended by: Wynn Banker H on: 02/11/2016 11:44 AM   Modules accepted: Orders

## 2016-02-11 NOTE — Patient Instructions (Addendum)
  It was a pleasure taking care of you today!  Continue using your CPAP machine.  WE will order you an autocpap machine 5-15 cm water.    Please make sure you use your CPAP device everytime you sleep.  We will monitor the usage of your machine per your insurance requirement.  Your insurance company may take the machine from you if you are not using it regularly.   Please clean the mask, tubings, filter, water reservoir with soapy water every week.  Please use distilled water for the water reservoir.   Please call the office or your machine provider (DME company) if you are having issues with the device.   Return to clinic in 2-3 mos after getting your new machine with Dr. Corrie Dandy or one of NPs.

## 2016-02-11 NOTE — Assessment & Plan Note (Signed)
On mirapex with +response. On 0.5 mg 3 tabs at hs.  RLS stable.  Plan to cut down dose on follow-up.

## 2016-02-11 NOTE — Assessment & Plan Note (Signed)
NPSG 2010:  AHI 9/hr, +PLMS with 2/hr with arousal and awakening.  His machine in 2010 is a Bipap but it is not working. Bipap was a lot of pressure. He uses a friend's cpap x 7 mos. He is on 7-13.   Pt has hypersomnia, snoring, witnessed apneas, fatigue on current machine. Bipap stopped working and is not delivering enough pressure.  Has  Bloating with BiPaP.   We got a home sleep test in May 2017 and his AHI was 17 an hour. We ordered an auto CPAP but according to the patient, his new CPAP machine will not be covered so he has not received a new one. We called that DME company and they said that he received a new machine in 2016. Patient denies this.  Currently he is using a friend's CPAP machine which is old (2008). It has an old SD card where in we cannot get a download on. Is not functioning well. He will need a new CPAP machine.  Plan :  We extensively discussed the diagnosis, pathophysiology, and treatment options for Obstructive Sleep Apnea (OSA).  We discussed treatment options for OSA including CPAP, BiPaP, as well as surgical options and oral devices.   We will start patient on autocpap. We called DME company today. We will get a download in a month. Previously, he was on BiPAP so I'm not sure if the CPAP will work. Told patient to give [place] a call if he doesn't get his new machine in a couple of weeks.  Patient was instructed to call the office if he/she has not received the PAP device in 1-2 weeks.  Patient was instructed to have mask, tubings, filter, reservoir cleaned at least once a week with soapy water.  Patient was instructed to call the office if he/she is having issues with the PAP device.    I advised patient to obtain sufficient amount of sleep --  7 to 8 hours at least in a 24 hr period.  Patient was advised to follow good sleep hygiene.  Patient was advised NOT to engage in activities requiring concentration and/or vigilance if he/she is and  sleepy.  Patient is NOT to  drive if he/she is sleepy.

## 2016-02-11 NOTE — Assessment & Plan Note (Signed)
CT chest 09/2012:  44mm RLL nodule. Grew up in [place], minimal smoking history, no personal h/o cancer.  Cancer in grandparents.  Chest CT 2015:  No change.   Chest CT scan in May 2017: No change. No further scans needed.

## 2016-02-11 NOTE — Progress Notes (Signed)
Subjective:    Patient ID: Zachary Dixon, male    DOB: 1966/06/08, 50 y.o.   MRN: [id number]  HPI Patient is here for f/u on his OSA, RLS.  ROV 11/04/15 Pt is here for f/u on his OSA. He broke his machine and is using a friend's CPAP machine. No download has been done.  ROV 02/11/16 Pt is here for f/u on his osa and RLS. Since last seen, he had a home sleep test which showed AHI of 18. Auto CPAP was ordered.    Review of Systems  Constitutional: Negative.   HENT: Negative.   Eyes: Negative.   Respiratory: Negative.   Cardiovascular: Negative.   Gastrointestinal: Negative.   Endocrine: Negative.   Genitourinary: Negative.   Musculoskeletal: Negative.   Skin: Negative.   Allergic/Immunologic: Negative.   Neurological: Negative.   Hematological: Negative.   Psychiatric/Behavioral: Negative.   All other systems reviewed and are negative.       Objective:   Physical Exam   Vitals:  Vitals:   02/11/16 0942  BP: 122/78  Pulse: 84  SpO2: 95%  Weight: 220 lb (99.8 kg)  Height: 6\' 2"  (1.88 m)    Constitutional/General:  Pleasant, well-nourished, well-developed, not in any distress,  Comfortably seating.  Well kempt  Body mass index is 28.25 kg/m. Wt Readings from Last 3 Encounters:  02/11/16 220 lb (99.8 kg)  11/04/15 221 lb (100.2 kg)  08/22/14 217 lb (98.4 kg)    Neck circumference: 16 in  HEENT: Pupils equal and reactive to light and accommodation. Anicteric sclerae. Normal nasal mucosa.   No oral  lesions,  mouth clear,  oropharynx clear, no postnasal drip. (-) Oral thrush. No dental caries.  Airway - Mallampati class III                                                                                                                                                            Neck: No masses. Midline trachea. No JVD, (-) LAD. (-) bruits appreciated.  Respiratory/Chest: Grossly normal chest. (-) deformity. (-) Accessory muscle use.  Symmetric expansion. (-)  Tenderness on palpation.  Resonant on percussion.  Diminished BS on both lower lung zones. (-) wheezing, crackles, rhonchi (-) egophony  Cardiovascular: Regular rate and  rhythm, heart sounds normal, no murmur or gallops, no peripheral edema  Gastrointestinal:  Normal bowel sounds. Soft, non-tender. No hepatosplenomegaly.  (-) masses.   Musculoskeletal:  Normal muscle tone. Normal gait.   Extremities: Grossly normal. (-) clubbing, cyanosis.  (-) edema  Skin: (-) rash,lesions seen.   Neurological/Psychiatric : alert, oriented to time, place, person. Normal mood and affect           Assessment & Plan:  OSA (obstructive sleep apnea) NPSG 2010:  AHI 9/hr, +PLMS with 2/hr with arousal  and awakening.  His machine in 2010 is a Bipap but it is not working. Bipap was a lot of pressure. He uses a friend's cpap x 7 mos. He is on 7-13.   Pt has hypersomnia, snoring, witnessed apneas, fatigue on current machine. Bipap stopped working and is not delivering enough pressure.  Has  Bloating with BiPaP.   We got a home sleep test in May 2017 and his AHI was 17 an hour. We ordered an auto CPAP but according to the patient, his new CPAP machine will not be covered so he has not received a new one. We called that DME company and they said that he received a new machine in 2016. Patient denies this.  Currently he is using a friend's CPAP machine which is old (2008). It has an old SD card where in we cannot get a download on. Is not functioning well. He will need a new CPAP machine.  Plan :  We extensively discussed the diagnosis, pathophysiology, and treatment options for Obstructive Sleep Apnea (OSA).  We discussed treatment options for OSA including CPAP, BiPaP, as well as surgical options and oral devices.   We will start patient on autocpap. We called DME company today. We will get a download in a month. Previously, he was on BiPAP so I'm not sure if the CPAP will work. Told patient to  give [place] a call if he doesn't get his new machine in a couple of weeks.  Patient was instructed to call the office if he/she has not received the PAP device in 1-2 weeks.  Patient was instructed to have mask, tubings, filter, reservoir cleaned at least once a week with soapy water.  Patient was instructed to call the office if he/she is having issues with the PAP device.    I advised patient to obtain sufficient amount of sleep --  7 to 8 hours at least in a 24 hr period.  Patient was advised to follow good sleep hygiene.  Patient was advised NOT to engage in activities requiring concentration and/or vigilance if he/she is and  sleepy.  Patient is NOT to drive if he/she is sleepy.      Solitary pulmonary nodule CT chest 09/2012:  44mm RLL nodule. Grew up in [place], minimal smoking history, no personal h/o cancer.  Cancer in grandparents.  Chest CT 2015:  No change.   Chest CT scan in May 2017: No change. No further scans needed.  PLMD (periodic limb movement disorder) On mirapex with +response. On 0.5 mg 3 tabs at hs.  RLS stable.  Plan to cut down dose on follow-up.  Severe recurrent major depression without psychotic features ([name]) Seems to be stable. Not interfering with cpap use.    Return to clinic in 3-4 mos.   [name], MD 02/11/2016, 11:39 AM  [facility] Pager [phone] After 3 pm or if no answer, call [phone]

## 2016-02-12 ENCOUNTER — Other Ambulatory Visit (HOSPITAL_COMMUNITY): Payer: Self-pay

## 2016-02-15 ENCOUNTER — Other Ambulatory Visit (HOSPITAL_COMMUNITY): Payer: Self-pay

## 2016-02-16 ENCOUNTER — Other Ambulatory Visit (HOSPITAL_COMMUNITY): Payer: Self-pay

## 2016-02-16 ENCOUNTER — Other Ambulatory Visit (INDEPENDENT_AMBULATORY_CARE_PROVIDER_SITE_OTHER): Payer: Self-pay

## 2016-02-16 DIAGNOSIS — F332 Major depressive disorder, recurrent severe without psychotic features: Secondary | ICD-10-CM

## 2016-02-16 NOTE — Progress Notes (Signed)
Patient ID: Zachary Dixon, male   DOB: 1965/12/17, 50 y.o.   MRN: [id number] Pt reported to [facility] for Repetitive Transcranial Magnetic Stimulation treatment for Major Depressive Disorder. Pt presented with pleasant affect. Pt reported no change in alcohol/substance use, caffeine consumption, sleep pattern or metal implant status since previous tx. Pt watched TV and engaged in conversation during tx. Power kept at 120% the duration of tx.Pt with no complaints post-tx. Pt left clinic with no problems or issues.

## 2016-02-17 ENCOUNTER — Other Ambulatory Visit (INDEPENDENT_AMBULATORY_CARE_PROVIDER_SITE_OTHER): Payer: Self-pay | Admitting: Emergency Medicine

## 2016-02-17 ENCOUNTER — Other Ambulatory Visit (HOSPITAL_COMMUNITY): Payer: Self-pay

## 2016-02-17 DIAGNOSIS — F332 Major depressive disorder, recurrent severe without psychotic features: Secondary | ICD-10-CM

## 2016-02-17 NOTE — Progress Notes (Signed)
Pt reported to  Zachary Dixon for Repetitive Transcranial Magnetic Stimulation treatment for Major Depressive Disorder. Pt presented with pleasant affect. Pt reported no change in alcohol/substance use, caffeine consumption, sleep pattern or metal implant status since previous tx. Pt watched TV and engaged in conversation during tx. Power kept at 120% the duration of tx.Pt with no complaints post-tx. Pt left clinic with no problems or issues.

## 2016-02-18 ENCOUNTER — Other Ambulatory Visit (HOSPITAL_COMMUNITY): Payer: Self-pay

## 2016-02-18 ENCOUNTER — Other Ambulatory Visit (INDEPENDENT_AMBULATORY_CARE_PROVIDER_SITE_OTHER): Payer: Self-pay

## 2016-02-18 DIAGNOSIS — F332 Major depressive disorder, recurrent severe without psychotic features: Secondary | ICD-10-CM

## 2016-02-18 NOTE — Progress Notes (Signed)
Patient ID: Zachary Dixon, male   DOB: 1966/03/31, 50 y.o.   MRN: [id number] Pt reported to [facility] for Repetitive Transcranial Magnetic Stimulation treatment for Major Depressive Disorder. Pt presented with pleasant affect. Pt reported no change in alcohol/substance use, caffeine consumption, sleep pattern or metal implant status since previous tx. Pt watched TV and engaged in conversation during tx. Pt stated that he was happy with his PHQ-9 score of 1. Stated that he may have been 0 but he still feels tired due to work hours. Power kept at 120% the duration of tx.Pt with no complaints post-tx. Pt left clinic with no problems or issues.

## 2016-02-19 ENCOUNTER — Other Ambulatory Visit (HOSPITAL_COMMUNITY): Payer: Self-pay

## 2016-02-22 ENCOUNTER — Other Ambulatory Visit (HOSPITAL_COMMUNITY): Payer: Self-pay

## 2016-02-22 ENCOUNTER — Other Ambulatory Visit (INDEPENDENT_AMBULATORY_CARE_PROVIDER_SITE_OTHER): Payer: Self-pay

## 2016-02-22 DIAGNOSIS — F332 Major depressive disorder, recurrent severe without psychotic features: Secondary | ICD-10-CM

## 2016-02-22 NOTE — Progress Notes (Signed)
Patient ID: Zachary Dixon, male   DOB: 11-20-1965, 50 y.o.   MRN: [id number] Pt reported to [facility] for Repetitive Transcranial Magnetic Stimulation treatment for Major Depressive Disorder. Pt presented with pleasant affect. Pt reported no change in alcohol/substance use, caffeine consumption, sleep pattern or metal implant status since previous tx. Pt watched TV and engaged in conversation during tx. Power kept at 120% the duration of tx.Pt with no complaints post-tx. Pt left clinic with no problems or issues.

## 2016-02-23 ENCOUNTER — Other Ambulatory Visit (INDEPENDENT_AMBULATORY_CARE_PROVIDER_SITE_OTHER): Payer: Self-pay

## 2016-02-23 ENCOUNTER — Other Ambulatory Visit (HOSPITAL_COMMUNITY): Payer: Self-pay

## 2016-02-23 DIAGNOSIS — F332 Major depressive disorder, recurrent severe without psychotic features: Secondary | ICD-10-CM

## 2016-02-23 NOTE — Progress Notes (Signed)
Patient ID: Zachary Dixon, male   DOB: 1966-02-11, 50 y.o.   MRN: [id number] Pt reported to [facility] for Repetitive Transcranial Magnetic Stimulation treatment for Major Depressive Disorder. Pt presented with pleasant affect. Pt reported no change in alcohol/substance use, caffeine consumption, sleep pattern or metal implant status since previous tx. Pt watched TV and engaged in conversation during tx. Pt completed PHQ-9, totaling 1. Pt stated that the 1 is because he works too much and feels stressed at times. Power kept at 120% the duration of tx.Pt with no complaints post-tx. Pt left clinic with no problems or issues.

## 2016-02-24 ENCOUNTER — Other Ambulatory Visit (HOSPITAL_COMMUNITY): Payer: Self-pay

## 2016-02-29 ENCOUNTER — Encounter (HOSPITAL_COMMUNITY): Payer: Self-pay

## 2016-03-01 ENCOUNTER — Other Ambulatory Visit (INDEPENDENT_AMBULATORY_CARE_PROVIDER_SITE_OTHER): Payer: Self-pay

## 2016-03-01 DIAGNOSIS — F332 Major depressive disorder, recurrent severe without psychotic features: Secondary | ICD-10-CM

## 2016-03-01 NOTE — Progress Notes (Signed)
Pt reported to [facility] for Repetitive Transcranial Magnetic Stimulation treatment for Major Depressive Disorder. Pt presented with pleasant affect. Pt reported no change in alcohol/substance use, caffeine consumption, sleep pattern or metal implant status since previous tx. Pt watched TV and engaged in conversation during tx. Pt completed PHQ-9, totaling 0. Pt stated that he has been getting more rest and feels much better. Power kept at 120% the duration of tx.Pt with no complaints post-tx. Pt left clinic with no problems or issues.

## 2016-04-07 ENCOUNTER — Ambulatory Visit (HOSPITAL_COMMUNITY): Payer: Self-pay

## 2016-04-14 ENCOUNTER — Ambulatory Visit: Payer: Self-pay | Admitting: Pulmonary Disease

## 2016-05-05 ENCOUNTER — Telehealth: Payer: Self-pay

## 2016-05-05 ENCOUNTER — Other Ambulatory Visit (HOSPITAL_COMMUNITY): Payer: BLUE CROSS/BLUE SHIELD | Attending: Psychiatry | Admitting: Psychiatry

## 2016-05-05 DIAGNOSIS — F332 Major depressive disorder, recurrent severe without psychotic features: Secondary | ICD-10-CM | POA: Diagnosis not present

## 2016-05-05 NOTE — Progress Notes (Signed)
Patient ID: Zachary Dixon, male   DOB: Jun 16, 1966, 50 y.o.   MRN: [id number] Zachary Dixon and his wife came for the follow up appointment after completing the [place] series.  No great result.  Some improvement about mid treatment he and his wife say but not significant by the completion of treatment.    Talked about ketamine treatment as well as ECT as the next options

## 2016-05-05 NOTE — Telephone Encounter (Signed)
LMOM to tell pt. To bring CPAP machine and cord for his appointment tomorrow.

## 2016-05-06 ENCOUNTER — Ambulatory Visit: Payer: Self-pay | Admitting: Pulmonary Disease

## 2016-05-10 DIAGNOSIS — F332 Major depressive disorder, recurrent severe without psychotic features: Secondary | ICD-10-CM | POA: Diagnosis not present

## 2016-05-17 DIAGNOSIS — F332 Major depressive disorder, recurrent severe without psychotic features: Secondary | ICD-10-CM | POA: Diagnosis not present

## 2016-05-26 DIAGNOSIS — F332 Major depressive disorder, recurrent severe without psychotic features: Secondary | ICD-10-CM | POA: Diagnosis not present

## 2016-05-27 ENCOUNTER — Ambulatory Visit: Payer: Self-pay | Admitting: Pulmonary Disease

## 2016-06-10 DIAGNOSIS — Z79899 Other long term (current) drug therapy: Secondary | ICD-10-CM | POA: Diagnosis not present

## 2016-06-10 DIAGNOSIS — Z23 Encounter for immunization: Secondary | ICD-10-CM | POA: Diagnosis not present

## 2016-06-10 DIAGNOSIS — G2581 Restless legs syndrome: Secondary | ICD-10-CM | POA: Diagnosis not present

## 2016-06-10 DIAGNOSIS — R55 Syncope and collapse: Secondary | ICD-10-CM | POA: Diagnosis not present

## 2016-06-10 DIAGNOSIS — G4730 Sleep apnea, unspecified: Secondary | ICD-10-CM | POA: Diagnosis not present

## 2016-06-13 ENCOUNTER — Ambulatory Visit (HOSPITAL_COMMUNITY)
Admission: RE | Admit: 2016-06-13 | Discharge: 2016-06-13 | Disposition: A | Discharge status: Home / Self Care | Payer: BLUE CROSS/BLUE SHIELD | Attending: Psychiatry | Admitting: Psychiatry

## 2016-06-13 ENCOUNTER — Encounter (HOSPITAL_COMMUNITY): Payer: Self-pay | Admitting: Licensed Clinical Social Worker

## 2016-06-13 DIAGNOSIS — R413 Other amnesia: Secondary | ICD-10-CM | POA: Diagnosis present

## 2016-06-13 DIAGNOSIS — F411 Generalized anxiety disorder: Secondary | ICD-10-CM | POA: Diagnosis not present

## 2016-06-13 NOTE — H&P (Signed)
Behavioral Health Medical Screening Exam  Zachary Dixon is an 50 y.o. male who presented as a walk in with his wife due to concern about an episode of memory loss that lasted four hours. Today patient is alert and oriented and denies any SI/HI. The patient is seen regularly by [name].   Total Time spent with patient: 20 minutes  Psychiatric Specialty Exam: Physical Exam  Constitutional: He is oriented to person, place, and time. He appears well-developed and well-nourished.  HENT:  Head: Normocephalic and atraumatic.  Right Ear: External ear normal.  Left Ear: External ear normal.  Neck: Normal range of motion.  Cardiovascular: Normal rate, regular rhythm, normal heart sounds and intact distal pulses.   Respiratory: Effort normal and breath sounds normal.  GI: Soft. Bowel sounds are normal.  Musculoskeletal: Normal range of motion.  Neurological: He is alert and oriented to person, place, and time.  Skin: Skin is warm and dry.    Review of Systems  Constitutional: Negative.   HENT: Negative.   Eyes: Negative.   Respiratory: Negative.   Cardiovascular: Negative.   Gastrointestinal: Negative.   Genitourinary: Negative.   Musculoskeletal: Negative.   Skin: Negative.   Neurological: Negative.   Endo/Heme/Allergies: Negative.   Psychiatric/Behavioral: Negative for depression, hallucinations, memory loss, substance abuse and suicidal ideas. The patient is nervous/anxious. The patient does not have insomnia.     Blood pressure 121/73, pulse 83, temperature 98.5 F (36.9 C), resp. rate 16.There is no height or weight on file to calculate BMI.  General Appearance: Casual  Eye Contact:  Good  Speech:  Clear and Coherent  Volume:  Normal  Mood:  Anxious  Affect:  Appropriate  Thought Process:  Coherent and Goal Directed  Orientation:  Full (Time, Place, and Person)  Thought Content:  WDL  Suicidal Thoughts:  No  Homicidal Thoughts:  No  Memory:  Immediate;   Good Recent;    Good Remote;   Good  Judgement:  Intact  Insight:  Present  Psychomotor Activity:  Normal  Concentration: Concentration: Good and Attention Span: Good  Recall:  Good  Fund of Knowledge:Good  Language: Good  Akathisia:  No  Handed:  Right  AIMS (if indicated):     Assets:  Communication Skills Desire for Improvement Financial Resources/Insurance Housing Intimacy Leisure Time [place] Talents/Skills Transportation  Sleep:       Musculoskeletal: Strength & Muscle Tone: within normal limits Gait & Station: normal Patient leans: N/A  Blood pressure 121/73, pulse 83, temperature 98.5 F (36.9 C), resp. rate 16.  Recommendations:  Based on my evaluation the patient does not appear to have an emergency medical condition.  Elmarie Shiley, NP 06/13/2016, 6:01 PM

## 2016-06-13 NOTE — BH Assessment (Addendum)
[place] Assessment Progress Note   Case was staffed with Rosana Hoes NP who recommended patient contact his primary provider and follow up with a earlier appointment date if possible. Patient was encouraged to return to ED if he was having feelings associated with self harm  or contact emergency services. Patient was able to contact for safety.
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
## 2016-06-15 ENCOUNTER — Ambulatory Visit
Admission: RE | Admit: 2016-06-15 | Discharge: 2016-06-15 | Disposition: A | Discharge status: Home / Self Care | Payer: BLUE CROSS/BLUE SHIELD | Source: Ambulatory Visit | Attending: Internal Medicine | Admitting: Internal Medicine

## 2016-06-15 ENCOUNTER — Other Ambulatory Visit: Payer: Self-pay | Admitting: Internal Medicine

## 2016-06-15 DIAGNOSIS — R55 Syncope and collapse: Secondary | ICD-10-CM

## 2016-06-28 DIAGNOSIS — F332 Major depressive disorder, recurrent severe without psychotic features: Secondary | ICD-10-CM | POA: Diagnosis not present

## 2016-07-11 HISTORY — PX: ELECTROCONVULSIVE THERAPY: SHX1495

## 2016-07-19 DIAGNOSIS — F332 Major depressive disorder, recurrent severe without psychotic features: Secondary | ICD-10-CM | POA: Diagnosis not present

## 2016-07-20 DIAGNOSIS — F332 Major depressive disorder, recurrent severe without psychotic features: Secondary | ICD-10-CM | POA: Diagnosis not present

## 2016-07-26 DIAGNOSIS — F332 Major depressive disorder, recurrent severe without psychotic features: Secondary | ICD-10-CM | POA: Diagnosis not present

## 2016-08-08 DIAGNOSIS — R05 Cough: Secondary | ICD-10-CM | POA: Diagnosis not present

## 2016-08-08 DIAGNOSIS — J209 Acute bronchitis, unspecified: Secondary | ICD-10-CM | POA: Diagnosis not present

## 2016-08-08 DIAGNOSIS — R5381 Other malaise: Secondary | ICD-10-CM | POA: Diagnosis not present

## 2016-08-11 DIAGNOSIS — F332 Major depressive disorder, recurrent severe without psychotic features: Secondary | ICD-10-CM | POA: Diagnosis not present

## 2016-08-17 DIAGNOSIS — Z6829 Body mass index (BMI) 29.0-29.9, adult: Secondary | ICD-10-CM | POA: Diagnosis not present

## 2016-08-17 DIAGNOSIS — K219 Gastro-esophageal reflux disease without esophagitis: Secondary | ICD-10-CM | POA: Diagnosis not present

## 2016-08-17 DIAGNOSIS — J209 Acute bronchitis, unspecified: Secondary | ICD-10-CM | POA: Diagnosis not present

## 2016-08-17 DIAGNOSIS — R05 Cough: Secondary | ICD-10-CM | POA: Diagnosis not present

## 2016-08-18 DIAGNOSIS — F332 Major depressive disorder, recurrent severe without psychotic features: Secondary | ICD-10-CM | POA: Diagnosis not present

## 2016-08-26 ENCOUNTER — Emergency Department (HOSPITAL_COMMUNITY)
Admission: EM | Admit: 2016-08-26 | Discharge: 2016-08-27 | Disposition: A | Discharge status: Home / Self Care | Payer: BLUE CROSS/BLUE SHIELD | Attending: Emergency Medicine | Admitting: Emergency Medicine

## 2016-08-26 ENCOUNTER — Encounter (HOSPITAL_COMMUNITY): Payer: Self-pay

## 2016-08-26 DIAGNOSIS — K219 Gastro-esophageal reflux disease without esophagitis: Secondary | ICD-10-CM | POA: Diagnosis not present

## 2016-08-26 DIAGNOSIS — J690 Pneumonitis due to inhalation of food and vomit: Secondary | ICD-10-CM | POA: Diagnosis not present

## 2016-08-26 DIAGNOSIS — R05 Cough: Secondary | ICD-10-CM | POA: Diagnosis not present

## 2016-08-26 DIAGNOSIS — F331 Major depressive disorder, recurrent, moderate: Secondary | ICD-10-CM | POA: Diagnosis not present

## 2016-08-26 DIAGNOSIS — Z87891 Personal history of nicotine dependence: Secondary | ICD-10-CM | POA: Diagnosis not present

## 2016-08-26 DIAGNOSIS — Z888 Allergy status to other drugs, medicaments and biological substances status: Secondary | ICD-10-CM | POA: Diagnosis not present

## 2016-08-26 DIAGNOSIS — F32A Depression, unspecified: Secondary | ICD-10-CM

## 2016-08-26 DIAGNOSIS — Z79899 Other long term (current) drug therapy: Secondary | ICD-10-CM | POA: Diagnosis not present

## 2016-08-26 DIAGNOSIS — J189 Pneumonia, unspecified organism: Secondary | ICD-10-CM | POA: Diagnosis not present

## 2016-08-26 DIAGNOSIS — F329 Major depressive disorder, single episode, unspecified: Secondary | ICD-10-CM | POA: Insufficient documentation

## 2016-08-26 DIAGNOSIS — R112 Nausea with vomiting, unspecified: Secondary | ICD-10-CM | POA: Diagnosis not present

## 2016-08-26 DIAGNOSIS — Z0389 Encounter for observation for other suspected diseases and conditions ruled out: Secondary | ICD-10-CM | POA: Diagnosis not present

## 2016-08-26 HISTORY — DX: Depression, unspecified: F32.A

## 2016-08-26 HISTORY — DX: Major depressive disorder, single episode, unspecified: F32.9

## 2016-08-26 LAB — COMPREHENSIVE METABOLIC PANEL
ALK PHOS: 60 U/L (ref 38–126)
ALT: 21 U/L (ref 17–63)
AST: 18 U/L (ref 15–41)
Albumin: 3.8 g/dL (ref 3.5–5.0)
Anion gap: 5 (ref 5–15)
BILIRUBIN TOTAL: 0.6 mg/dL (ref 0.3–1.2)
BUN: 19 mg/dL (ref 6–20)
CALCIUM: 8.8 mg/dL — AB (ref 8.9–10.3)
CO2: 24 mmol/L (ref 22–32)
CREATININE: 1.06 mg/dL (ref 0.61–1.24)
Chloride: 112 mmol/L — ABNORMAL HIGH (ref 101–111)
Glucose, Bld: 106 mg/dL — ABNORMAL HIGH (ref 65–99)
Potassium: 4 mmol/L (ref 3.5–5.1)
Sodium: 141 mmol/L (ref 135–145)
Total Protein: 6.7 g/dL (ref 6.5–8.1)

## 2016-08-26 LAB — CBC WITH DIFFERENTIAL/PLATELET
Basophils Absolute: 0 10*3/uL (ref 0.0–0.1)
Basophils Relative: 0 %
Eosinophils Absolute: 0.2 10*3/uL (ref 0.0–0.7)
Eosinophils Relative: 2 %
HCT: 39.8 % (ref 39.0–52.0)
HEMOGLOBIN: 13.7 g/dL (ref 13.0–17.0)
LYMPHS ABS: 2.4 10*3/uL (ref 0.7–4.0)
LYMPHS PCT: 28 %
MCH: 31.5 pg (ref 26.0–34.0)
MCHC: 34.4 g/dL (ref 30.0–36.0)
MCV: 91.5 fL (ref 78.0–100.0)
Monocytes Absolute: 0.4 10*3/uL (ref 0.1–1.0)
Monocytes Relative: 4 %
NEUTROS PCT: 66 %
Neutro Abs: 5.7 10*3/uL (ref 1.7–7.7)
Platelets: 221 10*3/uL (ref 150–400)
RBC: 4.35 MIL/uL (ref 4.22–5.81)
RDW: 12.6 % (ref 11.5–15.5)
WBC: 8.6 10*3/uL (ref 4.0–10.5)

## 2016-08-26 LAB — ETHANOL

## 2016-08-26 LAB — RAPID URINE DRUG SCREEN, HOSP PERFORMED
Amphetamines: POSITIVE — AB
Barbiturates: NOT DETECTED
Benzodiazepines: NOT DETECTED
Cocaine: NOT DETECTED
OPIATES: NOT DETECTED
TETRAHYDROCANNABINOL: NOT DETECTED

## 2016-08-26 LAB — LITHIUM LEVEL: LITHIUM LVL: 0.35 mmol/L — AB (ref 0.60–1.20)

## 2016-08-26 LAB — SALICYLATE LEVEL: Salicylate Lvl: <7 mg/dL (ref 2.8–30.0)

## 2016-08-26 LAB — ACETAMINOPHEN LEVEL

## 2016-08-26 MED ORDER — PANTOPRAZOLE SODIUM 40 MG PO TBEC
40.0000 mg | DELAYED_RELEASE_TABLET | Freq: Every day | ORAL | Status: DC
  Administered 2016-08-27: 40 mg via ORAL
  Filled 2016-08-26: qty 1

## 2016-08-26 MED ORDER — LITHIUM CARBONATE 300 MG PO CAPS
1200.0000 mg | ORAL_CAPSULE | Freq: Every day | ORAL | Status: DC
  Administered 2016-08-27: 1200 mg via ORAL
  Filled 2016-08-26: qty 4

## 2016-08-26 MED ORDER — LEVOFLOXACIN 750 MG PO TABS
750.0000 mg | ORAL_TABLET | Freq: Every day | ORAL | Status: DC
  Administered 2016-08-27: 750 mg via ORAL
  Filled 2016-08-26 (×2): qty 1

## 2016-08-26 MED ORDER — QUETIAPINE FUMARATE ER 50 MG PO TB24
150.0000 mg | ORAL_TABLET | Freq: Every evening | ORAL | Status: DC
  Administered 2016-08-27: 150 mg via ORAL
  Filled 2016-08-26 (×2): qty 3

## 2016-08-26 MED ORDER — LISDEXAMFETAMINE DIMESYLATE 50 MG PO CAPS
50.0000 mg | ORAL_CAPSULE | Freq: Every morning | ORAL | Status: DC
  Administered 2016-08-27: 50 mg via ORAL
  Filled 2016-08-26: qty 1

## 2016-08-26 MED ORDER — LITHIUM CARBONATE 300 MG PO CAPS
600.0000 mg | ORAL_CAPSULE | ORAL | Status: DC
  Administered 2016-08-27: 600 mg via ORAL
  Filled 2016-08-26 (×2): qty 2

## 2016-08-26 MED ORDER — PRAMIPEXOLE DIHYDROCHLORIDE 1 MG PO TABS
2.0000 mg | ORAL_TABLET | Freq: Every day | ORAL | Status: DC | PRN
  Filled 2016-08-26: qty 3

## 2016-08-26 MED ORDER — TRAZODONE HCL 50 MG PO TABS
50.0000 mg | ORAL_TABLET | Freq: Every day | ORAL | Status: DC
Start: 2016-08-26 — End: 2016-08-27
  Administered 2016-08-26: 50 mg via ORAL
  Filled 2016-08-26: qty 1
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
## 2016-08-26 NOTE — ED Notes (Signed)
Spoke to patient's wife. She conveys that she and the patient's primary care doctors are concerned that his worsening depression has been contributing to patient's noncompliance with psych and pneumonia medication regimens. Wife says that he has not completed antibiotics or steroids despite telling them he has. She says he has been ill with pneumonia for 3 weeks and continues to vomit. She is not consistently taking his lithium. Recently transitioned from zoloft to seroquel. Wife says patient missed 3 days of work last week due to depressive like symptoms. Would like patient to see psych while here. Her number is in chart if she needs to be contacted.

## 2016-08-26 NOTE — ED Notes (Signed)
Bed: [id number] Expected date:  Expected time:  Means of arrival:  Comments: Hold for triage patient

## 2016-08-26 NOTE — ED Provider Notes (Signed)
[facility] DEPT Provider Note   CSN: [id number] Arrival date & time: 08/26/16  1644     History   Chief Complaint Chief Complaint  Patient presents with  . Pneumonia  . Depression    HPI Zachary [facility] is a 51 y.o. male.  The history is provided by the patient and medical records.     51 year old male with history of allergies, anxiety, depression, hyperlipidemia, sleep apnea, presenting to the ED from PCP office for various complaints. Patient reports he has had cough and chest congestion for the past month. States he was started on 2 different courses of antibiotics (azitromycin and omnicef), however did not finish either of them. States he has sporadically been taking them. He continues to have productive cough with thick sputum, but less in caliber than previous. He denies any fever. No chest pain or shortness of breath. States he had a chest x-ray done at his primary care office today which she was told showed residual pneumonia. He was sent here for further management of this.  Patient's primary care doctor was also concerned about his depression. Patient admits that he has battled with this throughout his entire life. Does report he has had it "bad" few months as his father passed away in 08/01/2023. States he does see a psychiatrist regularly who has been trying to transition him off of the Zoloft and onto Seroquel. States he tapered off the Zoloft, but waited too long to start the Seroquel and he thinks this is contributing to his symptoms. He also sees a Social worker every 2 weeks or so.  States he has been missing some work due to his respiratory illness which is causing some stress as well. He has had suicidal thoughts in the past, none recently.  States he truly does not want to harm himself and does not have weapons or guns in which to act on this.  Denies homicidal ideation. No hallucinations.  Denies illicit drug use.  No significant alcohol use.    Past Medical History:    Diagnosis Date  . Allergy   . Anxiety   . Depression   . Hyperlipidemia   . Sleep apnea     Patient Active Problem List   Diagnosis Date Noted  . Severe recurrent major depression without psychotic features ([place]) 12/01/2015    Class: Chronic  . OSA (obstructive sleep apnea) 10/31/2012  . PLMD (periodic limb movement disorder) 10/31/2012  . Solitary pulmonary nodule 10/31/2012    Past Surgical History:  Procedure Laterality Date  . NO PAST SURGERIES         Home Medications    Prior to Admission medications   Medication Sig Start Date End Date Taking? Authorizing Provider  cefdinir (OMNICEF) 300 MG capsule Take 300 mg by mouth 2 (two) times daily. Started 2/7 x 7 days 08/17/16   Historical Provider, MD  lithium carbonate 300 MG capsule Take 4 capsules by mouth. As directed 10/17/15   Historical Provider, MD  omeprazole (PRILOSEC) 40 MG capsule Take 40 mg by mouth daily. 08/17/16   Historical Provider, MD  pramipexole (MIRAPEX) 1 MG tablet Take 2-3 tablets by mouth daily. 01/29/16   Historical Provider, MD  predniSONE (DELTASONE) 5 MG tablet Take 5-30 mg by mouth daily. Take 30mg  by mouth on day 1, then take 25mg  on day 2, 20mg  by mouth on day 3, 15mg  by mouth on day 4, 10mg  by mouth on day 5, 5mg  by mouth on day 6.  Started 2/7 x 6 days  08/17/16   Historical Provider, MD  promethazine-codeine (PHENERGAN WITH CODEINE) 6.25-10 MG/5ML syrup Take 5 mLs by mouth every 6 (six) hours as needed for cough. 08/17/16   Historical Provider, MD  propranolol (INDERAL) 20 MG tablet Take 20-40 mg by mouth 2 (two) times daily. 08/08/16   Historical Provider, MD  QUEtiapine Fumarate (SEROQUEL XR) 150 MG 24 hr tablet Take 150 mg by mouth every evening. 08/17/16   Historical Provider, MD  sertraline (ZOLOFT) 100 MG tablet Take 150 mg by mouth daily.    Historical Provider, MD  traZODone (DESYREL) 50 MG tablet Take 50-150 mg by mouth at bedtime.    Historical Provider, MD  [name] 50 MG capsule Take 50 mg by  mouth every morning. 08/09/14   Historical Provider, MD    Family History Family History  Problem Relation Age of Onset  . Asthma Father   . Emphysema Father   . Heart disease Paternal Grandfather   . Heart disease Paternal Grandmother   . Breast cancer Maternal Grandmother   . Colon cancer Maternal Grandfather     Social History Social History  Substance Use Topics  . Smoking status: Former Smoker    Packs/day: 1.00    Years: 7.00    Types: Cigarettes    Quit date: 07/11/1993  . Smokeless tobacco: Never Used  . Alcohol use Yes     Comment: 1-2 drinks daily     Allergies   Patient has no known allergies.   Review of Systems Review of Systems  Respiratory: Positive for cough.   Psychiatric/Behavioral:       Depression  All other systems reviewed and are negative.    Physical Exam Updated Vital Signs BP 149/94 (BP Location: Right Arm)   Pulse 83   Temp 98.4 F (36.9 C) (Oral)   Resp 18   Ht 6\' 1"  (1.854 m)   Wt 99.8 kg   SpO2 94%   BMI 29.03 kg/m   Physical Exam  Constitutional: He is oriented to person, place, and time. He appears well-developed and well-nourished.  HENT:  Head: Normocephalic and atraumatic.  Right Ear: Tympanic membrane and ear canal normal.  Left Ear: Tympanic membrane and ear canal normal.  Nose: Nose normal.  Mouth/Throat: Uvula is midline, oropharynx is clear and moist and mucous membranes are normal. No oropharyngeal exudate, posterior oropharyngeal edema, posterior oropharyngeal erythema or tonsillar abscesses.  Eyes: Conjunctivae and EOM are normal. Pupils are equal, round, and reactive to light.  Neck: Normal range of motion.  Cardiovascular: Normal rate, regular rhythm and normal heart sounds.   Pulmonary/Chest: Effort normal and breath sounds normal. No respiratory distress. He has no wheezes. He has no rhonchi.  Dry, hacking cough during exam  Abdominal: Soft. Bowel sounds are normal.  Musculoskeletal: Normal range of  motion.  Neurological: He is alert and oriented to person, place, and time.  Skin: Skin is warm and dry.  Rash noted across the upper chest and back in a christmas-tree distribution consistent with pityriasis rosea; there are no areas of excoriation; no signs of superimposed infection or cellulitis; no drainage; no bruising or petechiae  Psychiatric: He has a normal mood and affect. His speech is normal and behavior is normal. He is not actively hallucinating. He expresses no homicidal and no suicidal ideation. He expresses no suicidal plans and no homicidal plans.  Calm, cooperative with exam Denies SI/HI/AVH  Nursing note and vitals reviewed.    ED Treatments / Results  Labs (all labs ordered  are listed, but only abnormal results are displayed) Labs Reviewed  COMPREHENSIVE METABOLIC PANEL - Abnormal; Notable for the following:       Result Value   Chloride 112 (*)    Glucose, Bld 106 (*)    Calcium 8.8 (*)    All other components within normal limits  RAPID URINE DRUG SCREEN, HOSP PERFORMED - Abnormal; Notable for the following:    Amphetamines POSITIVE (*)    All other components within normal limits  ACETAMINOPHEN LEVEL - Abnormal; Notable for the following:    Acetaminophen (Tylenol), Serum <10 (*)    All other components within normal limits  LITHIUM LEVEL - Abnormal; Notable for the following:    Lithium Lvl 0.35 (*)    All other components within normal limits  CBC WITH DIFFERENTIAL/PLATELET  ETHANOL  SALICYLATE LEVEL    EKG  EKG Interpretation None       Radiology No results found.  Procedures Procedures (including critical care time)  Medications Ordered in ED Medications - No data to display   Initial Impression / Assessment and Plan / ED Course  I have reviewed the triage vital signs and the nursing notes.  Pertinent labs & imaging results that were available during my care of the patient were reviewed by me and considered in my medical decision  making (see chart for details).  51 year old male here with cough and congestion. Had x-ray done this morning which was concerning for residual pneumonia. He has been prescribed antibiotics, however has not been taking them as directed.  He is afebrile and nontoxic. His vitals are stable on room air. His lungs are overall clear without wheezes or rhonchi and he is in no acute respiratory distress. He was also sent here with concern for depression. His psychiatrist and primary care doctor were concern for his overall well-being and felt he needed inpatient treatment. Patient does admit he has had a "bad month" as several stressing factors which have been exacerbating his depression. He denies any active SI/HI/AVH.  Wife has concerns as well, her insight is more elaborate regarding this situation.  Screening labs have been obtained and are overall reassuring. Patient's vitals remained stable. Do not feel he needs inpatient treatment for his pneumonia. We'll start on Levaquin.  TTS consult pending.  10:38 PM Case discussed with TTS who has evaluated patient and spoken with his wife.  She is getting conflicting stories as well.  Will plan for inpatient placement.  Home meds have been ordered.  1 week of levaquin will be started as well for his CAP.  Final Clinical Impressions(s) / ED Diagnoses   Final diagnoses:  Community acquired pneumonia, unspecified laterality  Depression, unspecified depression type    New Prescriptions New Prescriptions   No medications on file     Larene Pickett, PA-C 08/27/16 0031    Varney Biles, MD 08/27/16 0151

## 2016-08-26 NOTE — ED Notes (Signed)
Awaiting for meds from pharmacy.

## 2016-08-26 NOTE — ED Triage Notes (Signed)
Patient has confirmed pneumonia. Patient has a productive cough with yellow sputum. Patient also reports severe depression and states he has been talking with his psychiatrist who suggested tht he needed a sitter. Patient denies SI/HI. Patient denies hallucinations. Patient stated, "My wife will think that I need additional help with depression more so than myself."

## 2016-08-26 NOTE — ED Notes (Signed)
TTS on going. 

## 2016-08-27 DIAGNOSIS — Z888 Allergy status to other drugs, medicaments and biological substances status: Secondary | ICD-10-CM

## 2016-08-27 DIAGNOSIS — F331 Major depressive disorder, recurrent, moderate: Secondary | ICD-10-CM | POA: Diagnosis not present

## 2016-08-27 DIAGNOSIS — Z79899 Other long term (current) drug therapy: Secondary | ICD-10-CM | POA: Diagnosis not present

## 2016-08-27 DIAGNOSIS — Z87891 Personal history of nicotine dependence: Secondary | ICD-10-CM | POA: Diagnosis not present

## 2016-08-27 MED ORDER — LEVOFLOXACIN 750 MG PO TABS: 750.0000 mg | ORAL_TABLET | Freq: Every day | ORAL | 0 refills | Status: DC

## 2016-08-27 MED ORDER — LITHIUM CARBONATE ER 300 MG PO TBCR: 600.0000 mg | EXTENDED_RELEASE_TABLET | Freq: Two times a day (BID) | ORAL | Status: DC

## 2016-08-27 NOTE — ED Notes (Signed)
TTS at bedside. 

## 2016-08-27 NOTE — ED Notes (Signed)
Pt in scrubs and wanded by security. All belongings sent home with wife.

## 2016-08-27 NOTE — [facility] Suicide Risk Assessment (Signed)
Suicide Risk Assessment  Discharge Assessment   [facility] Discharge Suicide Risk Assessment   Principal Problem: MDD (major depressive disorder), recurrent episode, moderate ([place]) Discharge Diagnoses:  Patient Active Problem List   Diagnosis Date Noted  . MDD (major depressive disorder), recurrent episode, moderate ([place]) [F33.1] 08/27/2016  . Severe recurrent major depression without psychotic features ([place]) [F33.2] 12/01/2015    Class: Chronic  . OSA (obstructive sleep apnea) [G47.33] 10/31/2012  . PLMD (periodic limb movement disorder) [G47.61] 10/31/2012  . Solitary pulmonary nodule [R91.1] 10/31/2012    Total Time spent with patient: 15 minutes  Musculoskeletal: Strength & Muscle Tone: within normal limits Gait & Station: normal Patient leans: N/A  Psychiatric Specialty Exam:   Blood pressure 116/69, pulse 98, temperature 98.4 F (36.9 C), temperature source Oral, resp. rate 20, height 6\' 1"  (1.854 m), weight 99.8 kg (220 lb), SpO2 98 %.Body mass index is 29.03 kg/m.   General Appearance: Fairly Groomed  Eye Contact:  Fair  Speech:  Clear and Coherent and Normal Rate  Volume:  Normal  Mood:  Depressed  Affect:  Congruent  Thought Process:  Coherent  Orientation:  Full (Time, Place, and Person)  Thought Content:  Logical  Suicidal Thoughts:  No  Homicidal Thoughts:  No  Memory:  Immediate;   Good Recent;   Good  Judgement:  Intact  Insight:  Fair  Psychomotor Activity:  Normal  Concentration:  Concentration: Good and Attention Span: Good  Recall:  Good  Fund of Knowledge:  Good  Language:  Good  Akathisia:  No  Handed:  Right  AIMS (if indicated):     Assets:  Communication Skills Desire for Improvement Financial Resources/Insurance Housing Physical Health Resilience Social Support  ADL's:  Intact  Cognition:  WNL  Sleep:      Mental Status Per Nursing Assessment::   On Admission:     Demographic Factors:  Male and Caucasian  Loss  Factors: NA  Historical Factors: NA  Risk Reduction Factors:   Responsible for children under 16 years of age, Sense of responsibility to family, Employed, Living with another person, especially a relative, Positive social support and Positive therapeutic relationship  Continued Clinical Symptoms:  Depression, moderate  Cognitive Features That Contribute To Risk:  None    Suicide Risk:  Minimal: No identifiable suicidal ideation.  Patients presenting with no risk factors but with morbid ruminations; may be classified as minimal risk based on the severity of the depressive symptoms    Plan Of Care/Follow-up recommendations:  Activity:  as tolerated Diet:  regular Tests:  as determined by PCP Other:  keep follow-up appointment with Dr. Lynder Parents  Serena Colonel, PMHNP-BC, FNP-BC [place] 08/27/2016, 1:20 PM
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
## 2016-08-27 NOTE — ED Notes (Signed)
Scrubs changed, pt wanded

## 2016-08-27 NOTE — ED Provider Notes (Signed)
Patient is being d/c from psych.  There was a request for ABX prescription, this was executed.   Carmin Muskrat, MD 08/27/16 1409

## 2016-08-27 NOTE — ED Notes (Addendum)
[name] is here to pick him up, pt ambulatory to dc area w/o difficulty, pt changed clothes prior to leaving ed.

## 2016-08-27 NOTE — Discharge Instructions (Signed)
Continue Seroquel and Trazodone as previously prescribed by [name]. Start taking Lithium 300 mg 3 tabs by mouth at bedtime. Please keep follow up appointment with [name].   Major Depressive Disorder, Adult Major depressive disorder (MDD) is a mental health condition. It may also be called clinical depression or unipolar depression. MDD usually causes feelings of sadness, hopelessness, or helplessness. MDD can also cause physical symptoms. It can interfere with work, school, relationships, and other everyday activities. MDD may be mild, moderate, or severe. It may occur once (single episode major depressive disorder) or it may occur multiple times (recurrent major depressive disorder). What are the causes? The exact cause of this condition is not known. MDD is most likely caused by a combination of things, which may include:  Genetic factors. These are traits that are passed along from parent to child.  Individual factors. Your personality, your behavior, and the way you handle your thoughts and feelings may contribute to MDD. This includes personality traits and behaviors learned from others.  Physical factors, such as:  Differences in the part of your brain that controls emotion. This part of your brain may be different than it is in people who do not have MDD.  Long-term (chronic) medical or psychiatric illnesses.  Social factors. Traumatic experiences or major life changes may play a role in the development of MDD. What increases the risk? This condition is more likely to develop in women. The following factors may also make you more likely to develop MDD:  A family history of depression.  Troubled family relationships.  Abnormally low levels of certain brain chemicals.  Traumatic events in childhood, especially abuse or the loss of a parent.  Being under a lot of stress, or long-term stress, especially from upsetting life experiences or losses.  A history of:  Chronic  physical illness.  Other mental health disorders.  Substance abuse.  Poor living conditions.  Experiencing social exclusion or discrimination on a regular basis. What are the signs or symptoms? The main symptoms of MDD typically include:  Constant depressed or irritable mood.  Loss of interest in things and activities. MDD symptoms may also include:  Sleeping or eating too much or too little.  Unexplained weight change.  Fatigue or low energy.  Feelings of worthlessness or guilt.  Difficulty thinking clearly or making decisions.  Thoughts of suicide or of harming others.  Physical agitation or weakness.  Isolation. Severe cases of MDD may also occur with other symptoms, such as:  Delusions or hallucinations, in which you imagine things that are not real (psychotic depression).  Low-level depression that lasts at least a year (chronic depression or persistent depressive disorder).  Extreme sadness and hopelessness (melancholic depression).  Trouble speaking and moving (catatonic depression). How is this diagnosed? This condition may be diagnosed based on:  Your symptoms.  Your medical history, including your mental health history. This may involve tests to evaluate your mental health. You may be asked questions about your lifestyle, including any drug and alcohol use, and how long you have had symptoms of MDD.  A physical exam.  Blood tests to rule out other conditions. You must have a depressed mood and at least four other MDD symptoms most of the day, nearly every day in the same 2-week timeframe before your health care provider can confirm a diagnosis of MDD. How is this treated? This condition is usually treated by mental health professionals, such as psychologists, psychiatrists, and clinical social workers. You may need more  than one type of treatment. Treatment may include:  Psychotherapy. This is also called talk therapy or counseling. Types of  psychotherapy include:  Cognitive behavioral therapy (CBT). This type of therapy teaches you to recognize unhealthy feelings, thoughts, and behaviors, and replace them with positive thoughts and actions.  Interpersonal therapy (IPT). This helps you to improve the way you relate to and communicate with others.  Family therapy. This treatment includes members of your family.  Medicine to treat anxiety and depression, or to help you control certain emotions and behaviors.  Lifestyle changes, such as:  Limiting alcohol and drug use.  Exercising regularly.  Getting plenty of sleep.  Making healthy eating choices.  Spending more time outdoors. Treatments involving stimulation of the brain can be used in situations with extremely severe symptoms, or when medicine or other therapies do not work over time. These treatments include electroconvulsive therapy, transcranial magnetic stimulation, and vagal nerve stimulation. Follow these instructions at home: Activity  Return to your normal activities as told by your health care provider.  Exercise regularly and spend time outdoors as told by your health care provider. General instructions  Take over-the-counter and prescription medicines only as told by your health care provider.  Do not drink alcohol. If you drink alcohol, limit your alcohol intake to no more than 1 drink a day for nonpregnant women and 2 drinks a day for men. One drink equals 12 oz of beer, 5 oz of wine, or 1 oz of hard liquor. Alcohol can affect any antidepressant medicines you are taking. Talk to your health care provider about your alcohol use.  Eat a healthy diet and get plenty of sleep.  Find activities that you enjoy doing, and make time to do them.  Consider joining a support group. Your health care provider may be able to recommend a support group.  Keep all follow-up visits as told by your health care provider. This is important. Where to find more  information: [employer] on Mental Illness  www.nami.org U.S. National Institute of Mental Health  https://carter.com/ National Suicide Prevention Lifeline  1-800-273-TALK (919)783-7368). This is free, 24-hour help. Contact a health care provider if:  Your symptoms get worse.  You develop new symptoms. Get help right away if:  You self-harm.  You have serious thoughts about hurting yourself or others.  You see, hear, taste, smell, or feel things that are not present (hallucinate). This information is not intended to replace advice given to you by your health care provider. Make sure you discuss any questions you have with your health care provider. Document Released: 10/22/2012 Document Revised: 03/03/2016 Document Reviewed: 01/06/2016 Elsevier Interactive Patient Education  2017 [employer].   For your ongoing mental health needs, you are advised to follow up with your current provider [name] at [facility].   [address], [place] [address]  Main: [phone]

## 2016-08-27 NOTE — ED Notes (Signed)
Zachary Dixon TTS into talk w/ wife and pt.

## 2016-08-27 NOTE — ED Notes (Signed)
Pt has contacted wife for transport.  Written dc instructions reviewed w/ pt.  Pt encouraged to take medications as directed, and follow up w/ his doctors, keep appt on Tuesday as scheduled, medication changes reviewed with patient.  Pt encouraged to take all of his antibiotics and follow up with his MD and return/seek treatment for any changes or worsening, pt verbalized understanding.  Pt denies si/hi/avh at this time and was encouraged to seek treatment should they develop or worsening of his depression.  Pt verbalized undersetanding.

## 2016-08-27 NOTE — ED Notes (Signed)
Bed: [facility] Expected date:  Expected time:  Means of arrival:  Comments: Hold 25

## 2016-08-27 NOTE — ED Notes (Signed)
Zachary Dixon tts in talking with pt and wife

## 2016-08-27 NOTE — ED Notes (Signed)
Pt ambulatory w/o difficulty to room 36 

## 2016-08-30 DIAGNOSIS — F332 Major depressive disorder, recurrent severe without psychotic features: Secondary | ICD-10-CM | POA: Diagnosis not present

## 2016-09-01 DIAGNOSIS — F332 Major depressive disorder, recurrent severe without psychotic features: Secondary | ICD-10-CM | POA: Diagnosis not present

## 2016-09-06 DIAGNOSIS — F332 Major depressive disorder, recurrent severe without psychotic features: Secondary | ICD-10-CM | POA: Diagnosis not present

## 2016-09-07 DIAGNOSIS — F332 Major depressive disorder, recurrent severe without psychotic features: Secondary | ICD-10-CM | POA: Diagnosis not present

## 2016-09-07 DIAGNOSIS — J690 Pneumonitis due to inhalation of food and vomit: Secondary | ICD-10-CM | POA: Diagnosis not present

## 2016-09-07 DIAGNOSIS — F312 Bipolar disorder, current episode manic severe with psychotic features: Secondary | ICD-10-CM | POA: Diagnosis not present

## 2016-09-07 DIAGNOSIS — Z6829 Body mass index (BMI) 29.0-29.9, adult: Secondary | ICD-10-CM | POA: Diagnosis not present

## 2016-09-07 DIAGNOSIS — K219 Gastro-esophageal reflux disease without esophagitis: Secondary | ICD-10-CM | POA: Diagnosis not present

## 2016-09-12 DIAGNOSIS — K219 Gastro-esophageal reflux disease without esophagitis: Secondary | ICD-10-CM | POA: Diagnosis not present

## 2016-09-12 DIAGNOSIS — Z6829 Body mass index (BMI) 29.0-29.9, adult: Secondary | ICD-10-CM | POA: Diagnosis not present

## 2016-09-12 DIAGNOSIS — F332 Major depressive disorder, recurrent severe without psychotic features: Secondary | ICD-10-CM | POA: Diagnosis not present

## 2016-09-12 DIAGNOSIS — J690 Pneumonitis due to inhalation of food and vomit: Secondary | ICD-10-CM | POA: Diagnosis not present

## 2016-09-15 DIAGNOSIS — F332 Major depressive disorder, recurrent severe without psychotic features: Secondary | ICD-10-CM | POA: Diagnosis not present

## 2016-09-20 DIAGNOSIS — F332 Major depressive disorder, recurrent severe without psychotic features: Secondary | ICD-10-CM | POA: Diagnosis not present

## 2016-09-22 DIAGNOSIS — F332 Major depressive disorder, recurrent severe without psychotic features: Secondary | ICD-10-CM | POA: Diagnosis not present

## 2016-09-28 DIAGNOSIS — J690 Pneumonitis due to inhalation of food and vomit: Secondary | ICD-10-CM | POA: Diagnosis not present

## 2016-09-28 DIAGNOSIS — R05 Cough: Secondary | ICD-10-CM | POA: Diagnosis not present

## 2016-09-28 DIAGNOSIS — F332 Major depressive disorder, recurrent severe without psychotic features: Secondary | ICD-10-CM | POA: Diagnosis not present

## 2016-09-28 DIAGNOSIS — R509 Fever, unspecified: Secondary | ICD-10-CM | POA: Diagnosis not present

## 2016-09-28 DIAGNOSIS — K219 Gastro-esophageal reflux disease without esophagitis: Secondary | ICD-10-CM | POA: Diagnosis not present

## 2016-09-28 DIAGNOSIS — J181 Lobar pneumonia, unspecified organism: Secondary | ICD-10-CM | POA: Diagnosis not present

## 2016-09-29 DIAGNOSIS — F332 Major depressive disorder, recurrent severe without psychotic features: Secondary | ICD-10-CM | POA: Diagnosis not present

## 2016-10-04 DIAGNOSIS — F332 Major depressive disorder, recurrent severe without psychotic features: Secondary | ICD-10-CM | POA: Diagnosis not present

## 2016-10-05 ENCOUNTER — Telehealth: Payer: Self-pay | Admitting: Internal Medicine

## 2016-10-05 NOTE — Telephone Encounter (Signed)
Called and spoke to pt's wife. She is requesting pt to be seen prior to 10/24/16. I informed her at this time that is the only available opening with an MD. Pt verbalized understanding and denied any further questions or concerns at this time.

## 2016-10-13 DIAGNOSIS — F332 Major depressive disorder, recurrent severe without psychotic features: Secondary | ICD-10-CM | POA: Diagnosis not present

## 2016-10-17 DIAGNOSIS — F332 Major depressive disorder, recurrent severe without psychotic features: Secondary | ICD-10-CM | POA: Diagnosis not present

## 2016-10-19 DIAGNOSIS — K219 Gastro-esophageal reflux disease without esophagitis: Secondary | ICD-10-CM | POA: Diagnosis not present

## 2016-10-20 DIAGNOSIS — F332 Major depressive disorder, recurrent severe without psychotic features: Secondary | ICD-10-CM | POA: Diagnosis not present

## 2016-10-24 ENCOUNTER — Ambulatory Visit (INDEPENDENT_AMBULATORY_CARE_PROVIDER_SITE_OTHER): Payer: BLUE CROSS/BLUE SHIELD | Admitting: Internal Medicine

## 2016-10-24 ENCOUNTER — Encounter: Payer: Self-pay | Admitting: Internal Medicine

## 2016-10-24 DIAGNOSIS — Z8701 Personal history of pneumonia (recurrent): Secondary | ICD-10-CM | POA: Diagnosis not present

## 2016-10-24 DIAGNOSIS — Z01811 Encounter for preprocedural respiratory examination: Secondary | ICD-10-CM

## 2016-10-24 NOTE — Patient Instructions (Signed)
Preoperative respiratory examination Low risk for pulmonary complication with sedation/anesthesia with ECT Rx    History of pneumonia Probably viral or aspiration in feb 2018 CT chest march 2018 with some scattered mild opacities  Plan Fu cxr with PCP AVVA,RAVISANKAR R, MD in 8-12 weeks  Followup As needed with pulmonary

## 2016-10-24 NOTE — Assessment & Plan Note (Signed)
Low risk for pulmonary complication with sedation/anesthesia with ECT Rx

## 2016-10-24 NOTE — Assessment & Plan Note (Signed)
Probably viral or aspiration in feb 2018 CT chest march 2018 with some scattered mild opacities  Plan Fu cxr with PCP AVVA,RAVISANKAR R, MD in 8-12 weeks  Followup As needed with pulmonary

## 2016-10-24 NOTE — Progress Notes (Signed)
Subjective:    Patient ID: Zachary Dixon, male    DOB: 11/20/1965, 51 y.o.   MRN: [id number]  PCP Zachary Ringer, MD   HPI   IOV 10/24/2016  Chief Complaint  Patient presents with  . Pulmonary Consult    pt reports he feels better, has gotten pneumonia 3 times, recieving shock therapy soon and needs pre-op to make sure he doesn't have pneumonia   51 year old male referred by primary care physician Zachary Ringer, MD . He has a long history of depression. He is a Limited remote smoker. He has pulmonary nodule just very small that the primary care physician follows. According to him and his wife he had a one-day admission for pneumonia that was preceded by some fatigue and cough and subjective fevers. In review of the chart also shows that he had a Zachary Dixon admission at this time in the emergency department physician called in some antibiotics for pneumonia. He tells me that the admission was for both issues. Currently is feeling back to baseline. He has upcoming elective callosal therapy multiple sessions at [facility] and he wants pulmonary clearance. There are no acute issues at this point from a pulmonary standpoint. The electroconvulsive therapy will be later in April of early May and it has not been scheduled but it'll definitely be more than 1 month since he got his antibiotic therapy   He had CT chest 11/09/2015 that I personally visualized and it is clear and is similar to 2015 CT chest. However he has a disc with him he has a CT chest 09/28/2016 that shows some scattered groundglass opacities left side greater than right     Results for Zachary, Dixon (MRN [id number]) as of 10/24/2016 16:13  Ref. Range 04/17/2015 10:15 04/17/2015 10:22 08/26/2016 19:59  Hemoglobin Latest Ref Range: 13.0 - 17.0 g/dL 14.9  13.7  Results for Zachary, Dixon (MRN [id number]) as of 10/24/2016 16:13  Ref. Range 04/17/2015 10:15 04/17/2015 10:22 08/26/2016 19:59  Eosinophils Absolute  Latest Ref Range: 0.0 - 0.7 K/uL 0.1  0.2     has a past medical history of Allergy; Anxiety; Depression; Hyperlipidemia; and Sleep apnea.   reports that he quit smoking about 23 years ago. His smoking use included Cigarettes. He has a 7.00 pack-year smoking history. He has never used smokeless tobacco.  Past Surgical History:  Procedure Laterality Date  . NO PAST SURGERIES      No Known Allergies  Immunization History  Administered Date(s) Administered  . Influenza Split 04/10/2012  . Influenza,inj,Quad PF,36+ Mos 04/15/2013  . Influenza-Unspecified 04/10/2014    Family History  Problem Relation Age of Onset  . Asthma Father   . Emphysema Father   . Heart disease Paternal Grandfather   . Heart disease Paternal Grandmother   . Breast cancer Maternal Grandmother   . Colon cancer Maternal Grandfather      Current Outpatient Prescriptions:  .  lithium carbonate 300 MG capsule, Take 600-1,200 capsules by mouth 2 (two) times daily. Take 600mg  by mouth every morning and take 1200mg  by mouth at night, Disp: , Rfl: 1 .  omeprazole (PRILOSEC) 40 MG capsule, Take 40 mg by mouth daily., Disp: , Rfl:  .  pramipexole (MIRAPEX) 1 MG tablet, Take 2-3 mg by mouth daily as needed (restless legs). , Disp: , Rfl:  .  QUEtiapine Fumarate (SEROQUEL XR) 150 MG 24 hr tablet, Take 150 mg by mouth every evening., Disp: , Rfl: 1 .  traZODone (DESYREL) 50 MG tablet,  Take 50-150 mg by mouth at bedtime., Disp: , Rfl:  .  [name] 50 MG capsule, Take 50 mg by mouth every morning., Disp: , Rfl: 0    Review of Systems  Constitutional: Positive for unexpected weight change. Negative for fever.  HENT: Negative for congestion, dental problem, ear pain, nosebleeds, postnasal drip, rhinorrhea, sinus pressure, sneezing, sore throat and trouble swallowing.   Eyes: Negative for redness and itching.  Respiratory: Positive for cough and shortness of breath. Negative for chest tightness and wheezing.     Cardiovascular: Negative for palpitations and leg swelling.  Gastrointestinal: Negative for nausea and vomiting.  Genitourinary: Negative for dysuria.  Musculoskeletal: Negative for joint swelling.  Skin: Positive for rash.  Neurological: Negative for headaches.  Hematological: Does not bruise/bleed easily.  Psychiatric/Behavioral: Negative for dysphoric mood. The patient is nervous/anxious.        Objective:   Physical Exam  Constitutional: He is oriented to person, place, and time. He appears well-developed and well-nourished. No distress.  HENT:  Head: Normocephalic and atraumatic.  Right Ear: External ear normal.  Left Ear: External ear normal.  Mouth/Throat: Oropharynx is clear and moist. No oropharyngeal exudate.  Eyes: Conjunctivae and EOM are normal. Pupils are equal, round, and reactive to light. Right eye exhibits no discharge. Left eye exhibits no discharge. No scleral icterus.  Neck: Normal range of motion. Neck supple. No JVD present. No tracheal deviation present. No thyromegaly present.  Cardiovascular: Normal rate, regular rhythm and intact distal pulses.  Exam reveals no gallop and no friction rub.   No murmur heard. Pulmonary/Chest: Effort normal and breath sounds normal. No respiratory distress. He has no wheezes. He has no rales. He exhibits no tenderness.  Abdominal: Soft. Bowel sounds are normal. He exhibits no distension and no mass. There is no tenderness. There is no rebound and no guarding.  Musculoskeletal: Normal range of motion. He exhibits no edema or tenderness.  Lymphadenopathy:    He has no cervical adenopathy.  Neurological: He is alert and oriented to person, place, and time. He has normal reflexes. No cranial nerve deficit. Coordination normal.  Skin: Skin is warm and dry. No rash noted. He is not diaphoretic. No erythema. No pallor.  Psychiatric: He has a normal mood and affect. His behavior is normal. Judgment and thought content normal.  Nursing  note and vitals reviewed.   Vitals:   10/24/16 1549  BP: 120/68  Pulse: (!) 103  SpO2: 98%  Weight: 232 lb (105.2 kg)  Height: 6\' 2"  (1.88 m)   Estimated body mass index is 29.79 kg/m as calculated from the following:   Height as of this encounter: 6\' 2"  (1.88 m).   Weight as of this encounter: 232 lb (105.2 kg).        Assessment & Plan:       ICD-9-CM ICD-10-CM   1. Preoperative respiratory examination V72.82 Z01.811   2. History of pneumonia V12.61 Z87.01      Preoperative respiratory examination Low risk for pulmonary complication with sedation/anesthesia with ECT Rx    History of pneumonia Probably viral or aspiration in feb 2018 CT chest march 2018 with some scattered mild opacities  Plan Fu cxr with PCP Zachary Ringer, MD in 8-12 weeks  Followup As needed with pulmonary    Dr. Brand Males, M.D., [facility].C.P Pulmonary and Critical Care Medicine Staff Physician [facility] Pager: [phone], If no answer or between  15:00h - 7:00h: call [phone]  10/26/2016 9:34 AM

## 2016-11-01 DIAGNOSIS — F332 Major depressive disorder, recurrent severe without psychotic features: Secondary | ICD-10-CM | POA: Diagnosis not present

## 2016-11-08 DIAGNOSIS — F332 Major depressive disorder, recurrent severe without psychotic features: Secondary | ICD-10-CM | POA: Diagnosis not present

## 2016-11-09 DIAGNOSIS — F319 Bipolar disorder, unspecified: Secondary | ICD-10-CM | POA: Diagnosis not present

## 2016-11-09 DIAGNOSIS — F329 Major depressive disorder, single episode, unspecified: Secondary | ICD-10-CM | POA: Diagnosis not present

## 2016-11-11 DIAGNOSIS — F329 Major depressive disorder, single episode, unspecified: Secondary | ICD-10-CM | POA: Diagnosis not present

## 2016-11-14 DIAGNOSIS — F332 Major depressive disorder, recurrent severe without psychotic features: Secondary | ICD-10-CM | POA: Diagnosis not present

## 2016-11-14 DIAGNOSIS — F319 Bipolar disorder, unspecified: Secondary | ICD-10-CM | POA: Diagnosis not present

## 2016-11-16 DIAGNOSIS — F332 Major depressive disorder, recurrent severe without psychotic features: Secondary | ICD-10-CM | POA: Diagnosis not present

## 2016-11-16 DIAGNOSIS — F319 Bipolar disorder, unspecified: Secondary | ICD-10-CM | POA: Diagnosis not present

## 2016-11-17 DIAGNOSIS — F332 Major depressive disorder, recurrent severe without psychotic features: Secondary | ICD-10-CM | POA: Diagnosis not present

## 2016-11-18 DIAGNOSIS — F319 Bipolar disorder, unspecified: Secondary | ICD-10-CM | POA: Diagnosis not present

## 2016-11-21 DIAGNOSIS — L03119 Cellulitis of unspecified part of limb: Secondary | ICD-10-CM | POA: Diagnosis not present

## 2016-11-21 DIAGNOSIS — F319 Bipolar disorder, unspecified: Secondary | ICD-10-CM | POA: Diagnosis not present

## 2016-11-21 DIAGNOSIS — F331 Major depressive disorder, recurrent, moderate: Secondary | ICD-10-CM | POA: Diagnosis not present

## 2016-11-21 DIAGNOSIS — Z6828 Body mass index (BMI) 28.0-28.9, adult: Secondary | ICD-10-CM | POA: Diagnosis not present

## 2016-11-22 DIAGNOSIS — F332 Major depressive disorder, recurrent severe without psychotic features: Secondary | ICD-10-CM | POA: Diagnosis not present

## 2016-11-23 DIAGNOSIS — F319 Bipolar disorder, unspecified: Secondary | ICD-10-CM | POA: Diagnosis not present

## 2016-11-25 DIAGNOSIS — F319 Bipolar disorder, unspecified: Secondary | ICD-10-CM | POA: Diagnosis not present

## 2016-11-28 DIAGNOSIS — F319 Bipolar disorder, unspecified: Secondary | ICD-10-CM | POA: Diagnosis not present

## 2016-11-28 DIAGNOSIS — F331 Major depressive disorder, recurrent, moderate: Secondary | ICD-10-CM | POA: Diagnosis not present

## 2016-11-29 DIAGNOSIS — F332 Major depressive disorder, recurrent severe without psychotic features: Secondary | ICD-10-CM | POA: Diagnosis not present

## 2016-11-30 DIAGNOSIS — F332 Major depressive disorder, recurrent severe without psychotic features: Secondary | ICD-10-CM | POA: Diagnosis not present

## 2016-12-15 DIAGNOSIS — F332 Major depressive disorder, recurrent severe without psychotic features: Secondary | ICD-10-CM | POA: Diagnosis not present

## 2016-12-15 DIAGNOSIS — F3341 Major depressive disorder, recurrent, in partial remission: Secondary | ICD-10-CM | POA: Diagnosis not present

## 2016-12-21 DIAGNOSIS — F329 Major depressive disorder, single episode, unspecified: Secondary | ICD-10-CM | POA: Diagnosis not present

## 2016-12-21 DIAGNOSIS — F3130 Bipolar disorder, current episode depressed, mild or moderate severity, unspecified: Secondary | ICD-10-CM | POA: Diagnosis not present

## 2017-01-03 DIAGNOSIS — F332 Major depressive disorder, recurrent severe without psychotic features: Secondary | ICD-10-CM | POA: Diagnosis not present

## 2017-01-04 DIAGNOSIS — F329 Major depressive disorder, single episode, unspecified: Secondary | ICD-10-CM | POA: Diagnosis not present

## 2017-01-05 DIAGNOSIS — F332 Major depressive disorder, recurrent severe without psychotic features: Secondary | ICD-10-CM | POA: Diagnosis not present

## 2017-01-10 DIAGNOSIS — E784 Other hyperlipidemia: Secondary | ICD-10-CM | POA: Diagnosis not present

## 2017-01-10 DIAGNOSIS — Z125 Encounter for screening for malignant neoplasm of prostate: Secondary | ICD-10-CM | POA: Diagnosis not present

## 2017-01-17 DIAGNOSIS — G4730 Sleep apnea, unspecified: Secondary | ICD-10-CM | POA: Diagnosis not present

## 2017-01-17 DIAGNOSIS — Z0000 Encounter for general adult medical examination without abnormal findings: Secondary | ICD-10-CM | POA: Diagnosis not present

## 2017-01-17 DIAGNOSIS — J690 Pneumonitis due to inhalation of food and vomit: Secondary | ICD-10-CM | POA: Diagnosis not present

## 2017-01-17 DIAGNOSIS — K219 Gastro-esophageal reflux disease without esophagitis: Secondary | ICD-10-CM | POA: Diagnosis not present

## 2017-01-17 DIAGNOSIS — G2581 Restless legs syndrome: Secondary | ICD-10-CM | POA: Diagnosis not present

## 2017-01-17 DIAGNOSIS — Z1389 Encounter for screening for other disorder: Secondary | ICD-10-CM | POA: Diagnosis not present

## 2017-01-17 DIAGNOSIS — F319 Bipolar disorder, unspecified: Secondary | ICD-10-CM | POA: Diagnosis not present

## 2017-01-17 DIAGNOSIS — N200 Calculus of kidney: Secondary | ICD-10-CM | POA: Diagnosis not present

## 2017-01-18 DIAGNOSIS — F332 Major depressive disorder, recurrent severe without psychotic features: Secondary | ICD-10-CM | POA: Diagnosis not present

## 2017-01-30 DIAGNOSIS — F332 Major depressive disorder, recurrent severe without psychotic features: Secondary | ICD-10-CM | POA: Diagnosis not present

## 2017-01-31 DIAGNOSIS — F332 Major depressive disorder, recurrent severe without psychotic features: Secondary | ICD-10-CM | POA: Diagnosis not present

## 2017-02-03 DIAGNOSIS — F319 Bipolar disorder, unspecified: Secondary | ICD-10-CM | POA: Diagnosis not present

## 2017-02-03 DIAGNOSIS — F3132 Bipolar disorder, current episode depressed, moderate: Secondary | ICD-10-CM | POA: Diagnosis not present

## 2017-02-03 DIAGNOSIS — F332 Major depressive disorder, recurrent severe without psychotic features: Secondary | ICD-10-CM | POA: Diagnosis not present

## 2017-02-07 DIAGNOSIS — F314 Bipolar disorder, current episode depressed, severe, without psychotic features: Secondary | ICD-10-CM | POA: Diagnosis not present

## 2017-02-07 DIAGNOSIS — F332 Major depressive disorder, recurrent severe without psychotic features: Secondary | ICD-10-CM | POA: Diagnosis not present

## 2017-02-17 DIAGNOSIS — F3130 Bipolar disorder, current episode depressed, mild or moderate severity, unspecified: Secondary | ICD-10-CM | POA: Diagnosis not present

## 2017-02-17 DIAGNOSIS — F329 Major depressive disorder, single episode, unspecified: Secondary | ICD-10-CM | POA: Diagnosis not present

## 2017-02-21 DIAGNOSIS — F332 Major depressive disorder, recurrent severe without psychotic features: Secondary | ICD-10-CM | POA: Diagnosis not present

## 2017-02-22 DIAGNOSIS — F332 Major depressive disorder, recurrent severe without psychotic features: Secondary | ICD-10-CM | POA: Diagnosis not present

## 2017-02-28 DIAGNOSIS — F332 Major depressive disorder, recurrent severe without psychotic features: Secondary | ICD-10-CM | POA: Diagnosis not present

## 2017-03-04 DIAGNOSIS — F3181 Bipolar II disorder: Secondary | ICD-10-CM | POA: Diagnosis not present

## 2017-03-08 DIAGNOSIS — F332 Major depressive disorder, recurrent severe without psychotic features: Secondary | ICD-10-CM | POA: Diagnosis not present

## 2017-03-10 DIAGNOSIS — F319 Bipolar disorder, unspecified: Secondary | ICD-10-CM | POA: Diagnosis not present

## 2017-03-10 DIAGNOSIS — F331 Major depressive disorder, recurrent, moderate: Secondary | ICD-10-CM | POA: Diagnosis not present

## 2017-03-21 DIAGNOSIS — F332 Major depressive disorder, recurrent severe without psychotic features: Secondary | ICD-10-CM | POA: Diagnosis not present

## 2017-03-22 DIAGNOSIS — F319 Bipolar disorder, unspecified: Secondary | ICD-10-CM | POA: Diagnosis not present

## 2017-03-24 DIAGNOSIS — F332 Major depressive disorder, recurrent severe without psychotic features: Secondary | ICD-10-CM | POA: Diagnosis not present

## 2017-03-28 DIAGNOSIS — F332 Major depressive disorder, recurrent severe without psychotic features: Secondary | ICD-10-CM | POA: Diagnosis not present

## 2017-04-04 DIAGNOSIS — F332 Major depressive disorder, recurrent severe without psychotic features: Secondary | ICD-10-CM | POA: Diagnosis not present

## 2017-04-13 DIAGNOSIS — F332 Major depressive disorder, recurrent severe without psychotic features: Secondary | ICD-10-CM | POA: Diagnosis not present

## 2017-04-18 DIAGNOSIS — F332 Major depressive disorder, recurrent severe without psychotic features: Secondary | ICD-10-CM | POA: Diagnosis not present

## 2017-04-19 DIAGNOSIS — F3181 Bipolar II disorder: Secondary | ICD-10-CM | POA: Diagnosis not present

## 2017-04-19 DIAGNOSIS — F319 Bipolar disorder, unspecified: Secondary | ICD-10-CM | POA: Diagnosis not present

## 2017-04-19 DIAGNOSIS — F3162 Bipolar disorder, current episode mixed, moderate: Secondary | ICD-10-CM | POA: Diagnosis not present
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
## 2017-04-25 DIAGNOSIS — F332 Major depressive disorder, recurrent severe without psychotic features: Secondary | ICD-10-CM | POA: Diagnosis not present

## 2017-04-27 DIAGNOSIS — F332 Major depressive disorder, recurrent severe without psychotic features: Secondary | ICD-10-CM | POA: Diagnosis not present

## 2017-05-02 DIAGNOSIS — F332 Major depressive disorder, recurrent severe without psychotic features: Secondary | ICD-10-CM | POA: Diagnosis not present

## 2017-05-10 DIAGNOSIS — F332 Major depressive disorder, recurrent severe without psychotic features: Secondary | ICD-10-CM | POA: Diagnosis not present

## 2017-05-10 DIAGNOSIS — F319 Bipolar disorder, unspecified: Secondary | ICD-10-CM | POA: Diagnosis not present

## 2017-05-11 DIAGNOSIS — F332 Major depressive disorder, recurrent severe without psychotic features: Secondary | ICD-10-CM | POA: Diagnosis not present

## 2017-05-24 DIAGNOSIS — F332 Major depressive disorder, recurrent severe without psychotic features: Secondary | ICD-10-CM | POA: Diagnosis not present

## 2017-05-29 DIAGNOSIS — N200 Calculus of kidney: Secondary | ICD-10-CM | POA: Diagnosis not present

## 2017-05-29 DIAGNOSIS — R8290 Unspecified abnormal findings in urine: Secondary | ICD-10-CM | POA: Diagnosis not present

## 2017-05-29 DIAGNOSIS — R509 Fever, unspecified: Secondary | ICD-10-CM | POA: Diagnosis not present

## 2017-05-29 DIAGNOSIS — R350 Frequency of micturition: Secondary | ICD-10-CM | POA: Diagnosis not present

## 2017-05-29 DIAGNOSIS — N390 Urinary tract infection, site not specified: Secondary | ICD-10-CM | POA: Diagnosis not present

## 2017-05-29 DIAGNOSIS — F332 Major depressive disorder, recurrent severe without psychotic features: Secondary | ICD-10-CM | POA: Diagnosis not present

## 2017-06-06 DIAGNOSIS — F332 Major depressive disorder, recurrent severe without psychotic features: Secondary | ICD-10-CM | POA: Diagnosis not present

## 2017-06-07 DIAGNOSIS — F332 Major depressive disorder, recurrent severe without psychotic features: Secondary | ICD-10-CM | POA: Diagnosis not present

## 2017-06-14 DIAGNOSIS — F332 Major depressive disorder, recurrent severe without psychotic features: Secondary | ICD-10-CM | POA: Diagnosis not present

## 2017-06-28 DIAGNOSIS — F332 Major depressive disorder, recurrent severe without psychotic features: Secondary | ICD-10-CM | POA: Diagnosis not present

## 2017-07-06 DIAGNOSIS — F332 Major depressive disorder, recurrent severe without psychotic features: Secondary | ICD-10-CM | POA: Diagnosis not present

## 2017-07-11 HISTORY — PX: COLONOSCOPY: SHX174

## 2017-07-19 DIAGNOSIS — F332 Major depressive disorder, recurrent severe without psychotic features: Secondary | ICD-10-CM | POA: Diagnosis not present

## 2017-07-26 DIAGNOSIS — F332 Major depressive disorder, recurrent severe without psychotic features: Secondary | ICD-10-CM | POA: Diagnosis not present

## 2017-08-09 DIAGNOSIS — F332 Major depressive disorder, recurrent severe without psychotic features: Secondary | ICD-10-CM | POA: Diagnosis not present

## 2017-08-22 DIAGNOSIS — F332 Major depressive disorder, recurrent severe without psychotic features: Secondary | ICD-10-CM | POA: Diagnosis not present

## 2017-09-06 DIAGNOSIS — F332 Major depressive disorder, recurrent severe without psychotic features: Secondary | ICD-10-CM | POA: Diagnosis not present

## 2017-09-07 DIAGNOSIS — F332 Major depressive disorder, recurrent severe without psychotic features: Secondary | ICD-10-CM | POA: Diagnosis not present

## 2017-09-13 DIAGNOSIS — F332 Major depressive disorder, recurrent severe without psychotic features: Secondary | ICD-10-CM | POA: Diagnosis not present

## 2017-09-27 DIAGNOSIS — F332 Major depressive disorder, recurrent severe without psychotic features: Secondary | ICD-10-CM | POA: Diagnosis not present

## 2017-10-03 DIAGNOSIS — F332 Major depressive disorder, recurrent severe without psychotic features: Secondary | ICD-10-CM | POA: Diagnosis not present

## 2017-10-05 DIAGNOSIS — F333 Major depressive disorder, recurrent, severe with psychotic symptoms: Secondary | ICD-10-CM | POA: Diagnosis not present

## 2017-10-05 DIAGNOSIS — F312 Bipolar disorder, current episode manic severe with psychotic features: Secondary | ICD-10-CM | POA: Diagnosis not present

## 2017-10-05 DIAGNOSIS — F332 Major depressive disorder, recurrent severe without psychotic features: Secondary | ICD-10-CM | POA: Diagnosis not present

## 2017-10-05 DIAGNOSIS — F322 Major depressive disorder, single episode, severe without psychotic features: Secondary | ICD-10-CM | POA: Diagnosis not present

## 2017-10-05 DIAGNOSIS — F900 Attention-deficit hyperactivity disorder, predominantly inattentive type: Secondary | ICD-10-CM | POA: Diagnosis not present

## 2017-10-10 DIAGNOSIS — F332 Major depressive disorder, recurrent severe without psychotic features: Secondary | ICD-10-CM | POA: Diagnosis not present

## 2017-10-26 DIAGNOSIS — F331 Major depressive disorder, recurrent, moderate: Secondary | ICD-10-CM | POA: Diagnosis not present

## 2017-11-02 DIAGNOSIS — F331 Major depressive disorder, recurrent, moderate: Secondary | ICD-10-CM | POA: Diagnosis not present

## 2017-11-09 DIAGNOSIS — F331 Major depressive disorder, recurrent, moderate: Secondary | ICD-10-CM | POA: Diagnosis not present

## 2017-11-16 DIAGNOSIS — F332 Major depressive disorder, recurrent severe without psychotic features: Secondary | ICD-10-CM | POA: Diagnosis not present

## 2017-11-16 DIAGNOSIS — F331 Major depressive disorder, recurrent, moderate: Secondary | ICD-10-CM | POA: Diagnosis not present

## 2017-11-23 DIAGNOSIS — F331 Major depressive disorder, recurrent, moderate: Secondary | ICD-10-CM | POA: Diagnosis not present

## 2017-11-30 DIAGNOSIS — F331 Major depressive disorder, recurrent, moderate: Secondary | ICD-10-CM | POA: Diagnosis not present

## 2017-12-07 DIAGNOSIS — F331 Major depressive disorder, recurrent, moderate: Secondary | ICD-10-CM | POA: Diagnosis not present

## 2017-12-08 DIAGNOSIS — F332 Major depressive disorder, recurrent severe without psychotic features: Secondary | ICD-10-CM | POA: Diagnosis not present

## 2017-12-13 DIAGNOSIS — F3160 Bipolar disorder, current episode mixed, unspecified: Secondary | ICD-10-CM | POA: Diagnosis not present

## 2017-12-13 DIAGNOSIS — Z79899 Other long term (current) drug therapy: Secondary | ICD-10-CM | POA: Diagnosis not present

## 2017-12-21 DIAGNOSIS — E038 Other specified hypothyroidism: Secondary | ICD-10-CM | POA: Diagnosis not present

## 2017-12-21 DIAGNOSIS — K219 Gastro-esophageal reflux disease without esophagitis: Secondary | ICD-10-CM | POA: Diagnosis not present

## 2017-12-21 DIAGNOSIS — F332 Major depressive disorder, recurrent severe without psychotic features: Secondary | ICD-10-CM | POA: Diagnosis not present

## 2017-12-21 DIAGNOSIS — G4730 Sleep apnea, unspecified: Secondary | ICD-10-CM | POA: Diagnosis not present

## 2017-12-21 DIAGNOSIS — F331 Major depressive disorder, recurrent, moderate: Secondary | ICD-10-CM | POA: Diagnosis not present

## 2018-01-04 DIAGNOSIS — F331 Major depressive disorder, recurrent, moderate: Secondary | ICD-10-CM | POA: Diagnosis not present

## 2018-01-05 DIAGNOSIS — F332 Major depressive disorder, recurrent severe without psychotic features: Secondary | ICD-10-CM | POA: Diagnosis not present

## 2018-01-19 DIAGNOSIS — E039 Hypothyroidism, unspecified: Secondary | ICD-10-CM | POA: Diagnosis not present

## 2018-01-25 DIAGNOSIS — F331 Major depressive disorder, recurrent, moderate: Secondary | ICD-10-CM | POA: Diagnosis not present

## 2018-01-29 DIAGNOSIS — F3160 Bipolar disorder, current episode mixed, unspecified: Secondary | ICD-10-CM | POA: Diagnosis not present

## 2018-02-01 DIAGNOSIS — F331 Major depressive disorder, recurrent, moderate: Secondary | ICD-10-CM | POA: Diagnosis not present

## 2018-02-02 DIAGNOSIS — F332 Major depressive disorder, recurrent severe without psychotic features: Secondary | ICD-10-CM | POA: Diagnosis not present

## 2018-02-08 DIAGNOSIS — F331 Major depressive disorder, recurrent, moderate: Secondary | ICD-10-CM | POA: Diagnosis not present

## 2018-02-14 DIAGNOSIS — E039 Hypothyroidism, unspecified: Secondary | ICD-10-CM | POA: Diagnosis not present

## 2018-02-15 DIAGNOSIS — F331 Major depressive disorder, recurrent, moderate: Secondary | ICD-10-CM | POA: Diagnosis not present

## 2018-02-22 DIAGNOSIS — F331 Major depressive disorder, recurrent, moderate: Secondary | ICD-10-CM | POA: Diagnosis not present

## 2018-03-01 DIAGNOSIS — F331 Major depressive disorder, recurrent, moderate: Secondary | ICD-10-CM | POA: Diagnosis not present

## 2018-03-08 DIAGNOSIS — F331 Major depressive disorder, recurrent, moderate: Secondary | ICD-10-CM | POA: Diagnosis not present

## 2018-03-09 DIAGNOSIS — F332 Major depressive disorder, recurrent severe without psychotic features: Secondary | ICD-10-CM | POA: Diagnosis not present

## 2018-03-15 DIAGNOSIS — F331 Major depressive disorder, recurrent, moderate: Secondary | ICD-10-CM | POA: Diagnosis not present

## 2018-03-22 DIAGNOSIS — F331 Major depressive disorder, recurrent, moderate: Secondary | ICD-10-CM | POA: Diagnosis not present

## 2018-03-27 DIAGNOSIS — R82998 Other abnormal findings in urine: Secondary | ICD-10-CM | POA: Diagnosis not present

## 2018-03-27 DIAGNOSIS — E038 Other specified hypothyroidism: Secondary | ICD-10-CM | POA: Diagnosis not present

## 2018-03-27 DIAGNOSIS — Z125 Encounter for screening for malignant neoplasm of prostate: Secondary | ICD-10-CM | POA: Diagnosis not present

## 2018-03-27 DIAGNOSIS — Z0000 Encounter for general adult medical examination without abnormal findings: Secondary | ICD-10-CM | POA: Diagnosis not present

## 2018-03-29 DIAGNOSIS — F331 Major depressive disorder, recurrent, moderate: Secondary | ICD-10-CM | POA: Diagnosis not present

## 2018-03-30 DIAGNOSIS — F332 Major depressive disorder, recurrent severe without psychotic features: Secondary | ICD-10-CM | POA: Diagnosis not present

## 2018-03-30 DIAGNOSIS — R7989 Other specified abnormal findings of blood chemistry: Secondary | ICD-10-CM | POA: Diagnosis not present

## 2018-04-02 ENCOUNTER — Other Ambulatory Visit: Payer: Self-pay | Admitting: Psychiatry

## 2018-04-02 DIAGNOSIS — E7849 Other hyperlipidemia: Secondary | ICD-10-CM | POA: Diagnosis not present

## 2018-04-02 DIAGNOSIS — Z79899 Other long term (current) drug therapy: Secondary | ICD-10-CM | POA: Diagnosis not present

## 2018-04-02 DIAGNOSIS — Z0000 Encounter for general adult medical examination without abnormal findings: Secondary | ICD-10-CM | POA: Diagnosis not present

## 2018-04-02 DIAGNOSIS — E038 Other specified hypothyroidism: Secondary | ICD-10-CM | POA: Diagnosis not present

## 2018-04-02 DIAGNOSIS — K219 Gastro-esophageal reflux disease without esophagitis: Secondary | ICD-10-CM | POA: Diagnosis not present

## 2018-04-02 DIAGNOSIS — Z23 Encounter for immunization: Secondary | ICD-10-CM | POA: Diagnosis not present

## 2018-04-02 DIAGNOSIS — F332 Major depressive disorder, recurrent severe without psychotic features: Secondary | ICD-10-CM | POA: Diagnosis not present

## 2018-04-02 DIAGNOSIS — F3160 Bipolar disorder, current episode mixed, unspecified: Secondary | ICD-10-CM | POA: Diagnosis not present

## 2018-04-02 DIAGNOSIS — E039 Hypothyroidism, unspecified: Secondary | ICD-10-CM | POA: Diagnosis not present

## 2018-04-03 LAB — TSH: TSH: 2.23 m[IU]/L (ref 0.40–4.50)

## 2018-04-03 LAB — VALPROIC ACID LEVEL: VALPROIC ACID LVL: 54.6 mg/L (ref 50.0–100.0)

## 2018-04-03 LAB — AMMONIA: Ammonia: 47 umol/L (ref <=72)

## 2018-04-03 LAB — LITHIUM LEVEL: LITHIUM LVL: 0.7 mmol/L (ref 0.6–1.2)

## 2018-04-19 DIAGNOSIS — F331 Major depressive disorder, recurrent, moderate: Secondary | ICD-10-CM | POA: Diagnosis not present

## 2018-04-20 ENCOUNTER — Ambulatory Visit: Payer: BLUE CROSS/BLUE SHIELD | Admitting: Psychiatry

## 2018-04-24 ENCOUNTER — Other Ambulatory Visit: Payer: Self-pay | Admitting: Psychiatry

## 2018-04-26 DIAGNOSIS — F331 Major depressive disorder, recurrent, moderate: Secondary | ICD-10-CM | POA: Diagnosis not present

## 2018-04-27 ENCOUNTER — Ambulatory Visit (INDEPENDENT_AMBULATORY_CARE_PROVIDER_SITE_OTHER): Payer: BLUE CROSS/BLUE SHIELD | Admitting: Psychiatry

## 2018-04-27 DIAGNOSIS — F411 Generalized anxiety disorder: Secondary | ICD-10-CM | POA: Diagnosis not present

## 2018-04-27 DIAGNOSIS — F319 Bipolar disorder, unspecified: Secondary | ICD-10-CM

## 2018-04-27 DIAGNOSIS — G4733 Obstructive sleep apnea (adult) (pediatric): Secondary | ICD-10-CM

## 2018-04-27 DIAGNOSIS — R7989 Other specified abnormal findings of blood chemistry: Secondary | ICD-10-CM

## 2018-04-27 DIAGNOSIS — F902 Attention-deficit hyperactivity disorder, combined type: Secondary | ICD-10-CM | POA: Diagnosis not present

## 2018-04-27 MED ORDER — LISDEXAMFETAMINE DIMESYLATE 70 MG PO CAPS: 70.0000 mg | ORAL_CAPSULE | Freq: Every day | ORAL | 0 refills | Status: DC

## 2018-04-27 NOTE — Progress Notes (Signed)
[facility]  Patient ID: Zachary Dixon,  MRN: [id number]  PCP: Prince Solian, MD  Date of Evaluation: 04/27/2018 Time spent:40 minutes   HISTORY/CURRENT STATUS: HPI  CC:  FU severe TR bipolar depression. Wife thinks last week or so somewhat better.  He's trying to improve behavioural habits and DBT helping.   No problems so far off Wellbutrin.  Less mood swinging Pt reports that mood is Anxious and Depressed and describes anxiety as Moderate. Anxiety symptoms include: Excessive Worry,. Pt reports no sleep issues. AVG 7 1/2 hours. Less erratic than usual.  Pt reports that appetite is good. Pt reports that energy is improved and improved. Concentration is good. Suicidal thoughts:  denied by patient.   Individual Medical History/ Review of Systems: Changes? :Yes PE reviewed from Dr. Joyce Copa .  OSA better use CPAP but not 100%.  It's an old machine 50 years old.  Allergies: Patient has no known allergies.  Current Medications:  Current Outpatient Medications:  .  divalproex (DEPAKOTE ER) 500 MG 24 hr tablet, TAKE 2 TABLETS BY MOUTH EVERY DAY AT BEDTIME, Disp: 60 tablet, Rfl: 1 .  levothyroxine (SYNTHROID, LEVOTHROID) 50 MCG tablet, Take 75 mcg by mouth daily., Disp: , Rfl:  .  lithium carbonate 300 MG capsule, Take 900 capsules by mouth at bedtime. Take 600mg  by mouth every morning and take 1200mg  by mouth at night, Disp: , Rfl: 1 .  omeprazole (PRILOSEC) 40 MG capsule, Take 40 mg by mouth daily., Disp: , Rfl:  .  traZODone (DESYREL) 50 MG tablet, Take 50-150 mg by mouth at bedtime., Disp: , Rfl:  .  Vitamin D, Ergocalciferol, (DRISDOL) 50000 units CAPS capsule, Take 50,000 Units by mouth 2 (two) times a week., Disp: , Rfl:  .  metFORMIN (GLUCOPHAGE) 500 MG tablet, Take 1,000 mg by mouth 2 (two) times daily., Disp: , Rfl: 1 .  pramipexole (MIRAPEX) 1 MG tablet, Take 2-3 mg by mouth daily as needed (restless legs). , Disp: , Rfl:  .  [name] 60 MG capsule, Take 60 mg by  mouth every morning. , Disp: , Rfl: 0 Medication Side Effects: Headache  Family Medical/ Social History: Changes? Yes Still working and a little better.  MENTAL HEALTH EXAM:  There were no vitals taken for this visit.There is no height or weight on file to calculate BMI.  General Appearance: Casual and looks tired chronically  Eye Contact:  Good  Speech:  Normal Rate  Volume:  Normal  Mood:  Anxious and Depressed  Affect:  Appropriate  Thought Process:  Goal Directed  Orientation:  Full (Time, Place, and Person)  Thought Content: Logical   Suicidal Thoughts:  No  Homicidal Thoughts:  No  Memory:  Recent  Judgement:  Fair  Insight:  Fair  Psychomotor Activity:  Restlessness mild  Concentration:  Concentration: Fair  Recall:  Good  Fund of Knowledge: Good  Language: Good  Akathisia:  No  AIMS (if indicated): not done  Assets:  Communication Skills Desire for Improvement Housing Intimacy Physical Health Talents/Skills Transportation Vocational/Educational  ADL's:  Intact  Cognition: WNL  Prognosis:  Fair   Lab Results  Component Value Date   TSH 2.23 04/02/2018   Lab Results  Component Value Date   LITHIUM 0.7 04/02/2018   NA 141 08/26/2016   BUN 19 08/26/2016   CREATININE 1.06 08/26/2016   TSH 2.23 04/02/2018   WBC 8.6 08/26/2016   Lab Results  Component Value Date   VALPROATE 54.6 04/02/2018  Ammonia nl 53 Vit D 28.3 on 03/30/18  DIAGNOSES:    ICD-10-CM   1. Bipolar I disorder with depression ([place]) F31.9   2. Attention deficit hyperactivity disorder (ADHD), combined type F90.2   3. Generalized anxiety disorder F41.1   4. Obstructive sleep apnea G47.33   5. Low vitamin D level R79.89     RECOMMENDATIONS:  Greater than 50% of face to face time with patient was spent on counseling and coordination of care. 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  This is been poorly controlled the entire  time that I have seen him.  Overall he seems a little better in the last week.  He see no difficulty from stopping the Wellbutrin.  The DBT appears to be helping some.  Perhaps the Depakote and [name] are helping some.  We discussed the possibility of going up in the Depakote, but he had a blood level of 127 on 1500 mg a day back in May.  So we will defer that even though the current level is a little low.  He will call if he has mood swings and then we will increase it. Increase [name] to maximize response to 70mg  daily.  Disc risk of mood swings. Continue DBT therapy.  Contact PCP about new CPAP machine!!!  Suspect poorly controlled sleep apnea.  Extensive discussion of the harm to the brain from poorly controlled OSA.  This is very very important!  I emphasize this as much as possible.  Follow-up 4 weeks Purnell Shoemaker, MD

## 2018-04-27 NOTE — Patient Instructions (Addendum)
Call if you start having mood swings or agitation or insomnia. Increase [name] to 70 mg each am asap CONTACT DOCTOR'S OFFICE ABOUT NEW CPAP MACHINE.

## 2018-05-03 DIAGNOSIS — F331 Major depressive disorder, recurrent, moderate: Secondary | ICD-10-CM | POA: Diagnosis not present

## 2018-05-06 DIAGNOSIS — F411 Generalized anxiety disorder: Secondary | ICD-10-CM | POA: Insufficient documentation

## 2018-05-06 DIAGNOSIS — F909 Attention-deficit hyperactivity disorder, unspecified type: Secondary | ICD-10-CM | POA: Insufficient documentation

## 2018-05-10 DIAGNOSIS — F331 Major depressive disorder, recurrent, moderate: Secondary | ICD-10-CM | POA: Diagnosis not present

## 2018-05-15 ENCOUNTER — Other Ambulatory Visit: Payer: Self-pay

## 2018-05-15 DIAGNOSIS — F331 Major depressive disorder, recurrent, moderate: Secondary | ICD-10-CM | POA: Diagnosis not present

## 2018-05-15 MED ORDER — METFORMIN HCL 500 MG PO TABS: 1000.0000 mg | ORAL_TABLET | Freq: Two times a day (BID) | ORAL | 2 refills | Status: DC

## 2018-05-17 ENCOUNTER — Encounter: Payer: Self-pay | Admitting: Psychiatry

## 2018-05-17 ENCOUNTER — Ambulatory Visit (INDEPENDENT_AMBULATORY_CARE_PROVIDER_SITE_OTHER): Payer: BLUE CROSS/BLUE SHIELD | Admitting: Psychiatry

## 2018-05-17 DIAGNOSIS — F3163 Bipolar disorder, current episode mixed, severe, without psychotic features: Secondary | ICD-10-CM | POA: Diagnosis not present

## 2018-05-17 DIAGNOSIS — F902 Attention-deficit hyperactivity disorder, combined type: Secondary | ICD-10-CM

## 2018-05-17 DIAGNOSIS — F411 Generalized anxiety disorder: Secondary | ICD-10-CM

## 2018-05-17 DIAGNOSIS — G4733 Obstructive sleep apnea (adult) (pediatric): Secondary | ICD-10-CM

## 2018-05-17 NOTE — Patient Instructions (Addendum)
Start Vraylar 1.5 mg each morning for 3 weeks. If there is no improvement increase the Vraylar to 3 mg each morning.  If there is improvement maintain 1.5 mg average per day by taking the 3 mg every other day.  Call if any problems with the medication  Contact PCP again about CPAP!!!  Very important.

## 2018-05-17 NOTE — Progress Notes (Signed)
Zachary Dixon [id number] 02/28/1966 52 y.o.  Subjective:   Patient ID:  Zachary Dixon is a 52 y.o. (DOB [birth year]) male.  Chief Complaint:  Chief Complaint  Patient presents with  . Depression  . Anxiety  . Sleeping Problem  . ADHD    HPI Zachary Dixon presents to the office today for follow-up of severe mixed bipolar and anxiety.  Continued severe sx and unstable.  No missed work days. Pt reports that mood is Anxious, Depressed and Irritable and describes anxiety as Moderate. Anxiety symptoms include: Excessive Worry,. Pt reports has interrupted sleep, has restless sleep and 6-7 hours. Irregular pattern.  Pt reports that appetite is good. Pt reports that energy is good and loss of interest or pleasure in usual activities, poor motivation and withdrawn from usual activities. Concentration is difficulty with focus and attention. Suicidal thoughts:  denied by patient. But death thoughts and dread.  Had a brief period where he was better and then slid back down.     W on the phone said he had one week where he was normal and then slid back.  She sees persistent daily depression without enjoyment and sleeps a lot.  Easily overwhelmed and irritable.  During the good week he was getting better sleep than usual.  Past psychiatric med medication trials include failure of ECT and TMS, Latuda 120 mg, Trintellix, aripiprazole, olanzapine 20 mg, Symbyax Seroquel, Rexulti, pramipexole, lamotrigine, duloxetine, Depakote, steak, sertraline, bupropion 450 mg caused anxiety, Lexapro, Viibryd, [name], Concerta 72 mg, Deplin  Review of Systems:  Review of Systems  Neurological: Negative for tremors and weakness.  Psychiatric/Behavioral: Positive for behavioral problems, decreased concentration, dysphoric mood and sleep disturbance. Negative for agitation, confusion, hallucinations, self-injury and suicidal ideas. The patient is nervous/anxious. The patient is not hyperactive.     Medications: I  have reviewed the patient's current medications.  Current Outpatient Medications  Medication Sig Dispense Refill  . divalproex (DEPAKOTE ER) 500 MG 24 hr tablet TAKE 2 TABLETS BY MOUTH EVERY DAY AT BEDTIME 60 tablet 1  . levothyroxine (SYNTHROID, LEVOTHROID) 25 MCG tablet Take 50 mcg by mouth every morning.     . lisdexamfetamine ([name]) 70 MG capsule Take 1 capsule (70 mg total) by mouth daily. (Patient taking differently: Take 70 mg by mouth every morning. ) 30 capsule 0  . lithium carbonate 300 MG capsule Take 900 capsules by mouth at bedtime.   1  . metFORMIN (GLUCOPHAGE) 500 MG tablet Take 2 tablets (1,000 mg total) by mouth 2 (two) times daily. 120 tablet 2  . omeprazole (PRILOSEC) 40 MG capsule Take 40 mg by mouth daily.    . pramipexole (MIRAPEX) 1 MG tablet Take 2-3 mg by mouth daily as needed (restless legs).     . traZODone (DESYREL) 50 MG tablet Take 50-150 mg by mouth at bedtime.    . Vitamin D, Ergocalciferol, (DRISDOL) 50000 units CAPS capsule Take 50,000 Units by mouth 2 (two) times a week.     No current facility-administered medications for this visit.     Medication Side Effects: None  Allergies: No Known Allergies  Past Medical History:  Diagnosis Date  . Allergy   . Anxiety   . Depression   . Hyperlipidemia   . Sleep apnea     Family History  Problem Relation Age of Onset  . Asthma Father   . Emphysema Father   . Heart disease Paternal Grandfather   . Heart disease Paternal Grandmother   . Breast cancer Maternal Grandmother   .  Colon cancer Maternal Grandfather     Social History   Socioeconomic History  . Marital status: Married    Spouse name: Not on file  . Number of children: 1  . Years of education: Not on file  . Highest education level: Not on file  Occupational History  . Occupation: Doctor, hospital  . Financial resource strain: Not on file  . Food insecurity:    Worry: Not on file    Inability: Not on file  .  Transportation needs:    Medical: Not on file    Non-medical: Not on file  Tobacco Use  . Smoking status: Former Smoker    Packs/day: 1.00    Years: 7.00    Pack years: 7.00    Types: Cigarettes    Last attempt to quit: 07/11/1993    Years since quitting: 24.8  . Smokeless tobacco: Never Used  Substance and Sexual Activity  . Alcohol use: Yes    Comment: 1-2 drinks daily  . Drug use: No  . Sexual activity: Not on file  Lifestyle  . Physical activity:    Days per week: Not on file    Minutes per session: Not on file  . Stress: Not on file  Relationships  . Social connections:    Talks on phone: Not on file    Gets together: Not on file    Attends religious service: Not on file    Active member of club or organization: Not on file    Attends meetings of clubs or organizations: Not on file    Relationship status: Not on file  . Intimate partner violence:    Fear of current or ex partner: Not on file    Emotionally abused: Not on file    Physically abused: Not on file    Forced sexual activity: Not on file  Other Topics Concern  . Not on file  Social History Narrative  . Not on file    Past Medical History, Surgical history, Social history, and Family history were reviewed and updated as appropriate.   Please see review of systems for further details on the patient's review from today.   Objective:   Physical Exam:  There were no vitals taken for this visit.  Physical Exam  Neurological: He displays no tremor. Gait normal.  Psychiatric: Judgment normal. His mood appears anxious. His affect is angry. His speech is slurred. His speech is not rapid and/or pressured and not tangential. He is withdrawn. He is not hyperactive and not actively hallucinating. Thought content is not paranoid. Cognition and memory are normal. He exhibits a depressed mood. He expresses no homicidal and no suicidal ideation. He is noncommunicative.  Insight and judgment fair. He is attentive.     Lab Review:     Component Value Date/Time   NA 141 08/26/2016 1959   K 4.0 08/26/2016 1959   CL 112 (H) 08/26/2016 1959   CO2 24 08/26/2016 1959   GLUCOSE 106 (H) 08/26/2016 1959   BUN 19 08/26/2016 1959   CREATININE 1.06 08/26/2016 1959   CALCIUM 8.8 (L) 08/26/2016 1959   PROT 6.7 08/26/2016 1959   ALBUMIN 3.8 08/26/2016 1959   AST 18 08/26/2016 1959   ALT 21 08/26/2016 1959   ALKPHOS 60 08/26/2016 1959   BILITOT 0.6 08/26/2016 1959   GFRNONAA >60 08/26/2016 1959   GFRAA >60 08/26/2016 1959       Component Value Date/Time   WBC 8.6 08/26/2016 1959  RBC 4.35 08/26/2016 1959   HGB 13.7 08/26/2016 1959   HCT 39.8 08/26/2016 1959   PLT 221 08/26/2016 1959   MCV 91.5 08/26/2016 1959   MCH 31.5 08/26/2016 1959   MCHC 34.4 08/26/2016 1959   RDW 12.6 08/26/2016 1959   LYMPHSABS 2.4 08/26/2016 1959   MONOABS 0.4 08/26/2016 1959   EOSABS 0.2 08/26/2016 1959   BASOSABS 0.0 08/26/2016 1959    Lithium Lvl  Date Value Ref Range Status  04/02/2018 0.7 0.6 - 1.2 mmol/L Final     Lab Results  Component Value Date   VALPROATE 54.6 04/02/2018     .res Assessment: Plan:    Bipolar 1 disorder, mixed, severe ([place])  Attention deficit hyperactivity disorder (ADHD), combined type  Generalized anxiety disorder  Obstructive sleep apnea   Greater than 50% of face to face time with patient was spent on counseling and coordination of care. 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  His symptoms have been severe and very resistant to any benefit.  He is noticed no change off the bupropion.  He cannot tolerate an increase in the Depakote based on prior ataxic experiences at 1500 mg daily.  Higher doses of lithium have not helped anymore.  He has failed multiple medications and ECT and [place]. Reviewed labs with him and his wife.  The TSH is returned to normal.  Lithium levels  in the low normal range.  The Depakote's in the low normal range.  His ammonia level is not high.  So that is not the cause of him having side effects from higher doses of Depakote.  Next lab draw we should check vitamin D levels again because that has been low. Has been taking [name] 60 daily but plans to go up to 70 mg as planned in the last visit.  Vraylar 1.5 mg daily is the next best option.  Discussed side effects in detail. Discussed potential metabolic side effects associated with atypical antipsychotics, as well as potential risk for movement side effects. Advised pt to contact office if movement side effects occur.  If he has no benefit at 1.5 mg for 3 weeks then we will increase to 3 mg daily as long as he is not having side effects.  Still needs to get more aggressive about the treatment of CPAP for his OSA!!!  This was a 40-minute appointment  Follow-up 6 weeks  Lynder Parents MD, DFAPA  No future appointments.  No orders of the defined types were placed in this encounter.  After Visit Summary for patient specific instructions. Start Vraylar 1.5 mg each morning for 3 weeks. If there is no improvement increase the Vraylar to 3 mg each morning.  If there is improvement maintain 1.5 mg average per day by taking the 3 mg every other day.  Call if any problems with the medication  Contact PCP again about CPAP!!!  Very important.      -------------------------------

## 2018-05-22 DIAGNOSIS — F331 Major depressive disorder, recurrent, moderate: Secondary | ICD-10-CM | POA: Diagnosis not present

## 2018-05-31 DIAGNOSIS — F331 Major depressive disorder, recurrent, moderate: Secondary | ICD-10-CM | POA: Diagnosis not present

## 2018-06-12 DIAGNOSIS — F331 Major depressive disorder, recurrent, moderate: Secondary | ICD-10-CM | POA: Diagnosis not present

## 2018-06-13 ENCOUNTER — Other Ambulatory Visit: Payer: Self-pay | Admitting: Psychiatry

## 2018-06-19 DIAGNOSIS — F331 Major depressive disorder, recurrent, moderate: Secondary | ICD-10-CM | POA: Diagnosis not present

## 2018-06-21 ENCOUNTER — Other Ambulatory Visit: Payer: Self-pay | Admitting: Psychiatry

## 2018-06-25 ENCOUNTER — Telehealth: Payer: Self-pay | Admitting: Psychiatry

## 2018-06-25 ENCOUNTER — Other Ambulatory Visit: Payer: Self-pay | Admitting: Psychiatry

## 2018-06-25 DIAGNOSIS — F902 Attention-deficit hyperactivity disorder, combined type: Secondary | ICD-10-CM

## 2018-06-25 MED ORDER — LISDEXAMFETAMINE DIMESYLATE 70 MG PO CAPS: 70.0000 mg | ORAL_CAPSULE | Freq: Every day | ORAL | 0 refills | Status: DC

## 2018-06-25 NOTE — Telephone Encounter (Signed)
Pt. Called and would like a refill on his vyvanse 70 mg. [facility] escribe [facility] on [address].

## 2018-06-25 NOTE — Progress Notes (Signed)
2 vyvanse 70 refills

## 2018-06-28 ENCOUNTER — Encounter: Payer: Self-pay | Admitting: Psychiatry

## 2018-06-28 ENCOUNTER — Ambulatory Visit (INDEPENDENT_AMBULATORY_CARE_PROVIDER_SITE_OTHER): Payer: BLUE CROSS/BLUE SHIELD | Admitting: Psychiatry

## 2018-06-28 DIAGNOSIS — R7989 Other specified abnormal findings of blood chemistry: Secondary | ICD-10-CM | POA: Diagnosis not present

## 2018-06-28 DIAGNOSIS — F900 Attention-deficit hyperactivity disorder, predominantly inattentive type: Secondary | ICD-10-CM

## 2018-06-28 DIAGNOSIS — F3131 Bipolar disorder, current episode depressed, mild: Secondary | ICD-10-CM

## 2018-06-28 DIAGNOSIS — G2581 Restless legs syndrome: Secondary | ICD-10-CM

## 2018-06-28 DIAGNOSIS — F411 Generalized anxiety disorder: Secondary | ICD-10-CM

## 2018-06-28 NOTE — Progress Notes (Signed)
Zachary Dixon [id number] 1965-12-22 52 y.o.  Subjective:   Patient ID:  Zachary Dixon is a 52 y.o. (DOB 23-Jun-1966) male.  Chief Complaint:  Chief Complaint  Patient presents with  . Follow-up    severe depression and anxiety  . ADHD    HPI urgent appt DT severe sx seen after hours Zachary Dixon presents to the office today for follow-up of Severe TR bipolar depression and rapid cycling with marked impairment in function at work and home.    Started Vraylar 1.5 on 11/8.  Also increased [name] to 70 mg.  This made a noticeable boost.  Vraylar first week or so felt a little jumpy and hyper but resolved.  Vraylar helped mood.  No recent depression.  Still situational anxiety and irritable at times.  Better recognizes this.  I think it's better.  Definitely an improvement.    Zachary Dixon had sinus surgery in [place] this week.    Past psychiatric med medication trials include failure of ECT and TMS, Latuda 120 mg, Trintellix, aripiprazole, olanzapine 20 mg, Symbyax Seroquel, Rexulti, pramipexole, lamotrigine, duloxetine, Depakote, steak, sertraline, bupropion 450 mg caused anxiety, Lexapro, Viibryd, [name], Concerta 72 mg, Deplin  Review of Systems:  Review of Systems  Gastrointestinal: Positive for nausea.  Neurological: Positive for tremors. Negative for weakness.  Psychiatric/Behavioral: Negative for agitation, behavioral problems, confusion, decreased concentration, dysphoric mood, hallucinations, self-injury, sleep disturbance and suicidal ideas. The patient is nervous/anxious. The patient is not hyperactive.     Medications: I have reviewed the patient's current medications.  Current Outpatient Medications  Medication Sig Dispense Refill  . cariprazine (VRAYLAR) capsule Take 1.5 mg by mouth daily.    . divalproex (DEPAKOTE ER) 500 MG 24 hr tablet TAKE 2 TABLETS BY MOUTH EVERY DAY AT BEDTIME 60 tablet 1  . levothyroxine (SYNTHROID, LEVOTHROID) 25 MCG tablet Take 50 mcg by  mouth every morning.     . lisdexamfetamine ([name]) 70 MG capsule Take 1 capsule (70 mg total) by mouth daily. 30 capsule 0  . lithium carbonate (LITHOBID) 300 MG CR tablet TAKE 3 TABLETS BY MOUTH AT BEDTIME. 90 tablet 0  . omeprazole (PRILOSEC) 40 MG capsule Take 40 mg by mouth daily.    . pramipexole (MIRAPEX) 1 MG tablet Take 2-3 mg by mouth daily as needed (restless legs).     . traZODone (DESYREL) 50 MG tablet Take 50-150 mg by mouth at bedtime.    . Vitamin D, Ergocalciferol, (DRISDOL) 50000 units CAPS capsule Take 50,000 Units by mouth 2 (two) times a week.    Derrill Memo ON 07/23/2018] lisdexamfetamine ([name]) 70 MG capsule Take 1 capsule (70 mg total) by mouth daily. 30 capsule 0  . metFORMIN (GLUCOPHAGE) 500 MG tablet Take 2 tablets (1,000 mg total) by mouth 2 (two) times daily. (Patient not taking: Reported on 06/28/2018) 120 tablet 2   No current facility-administered medications for this visit.     Medication Side Effects: None  Tremor not gone but less  Allergies: No Known Allergies  Past Medical History:  Diagnosis Date  . Allergy   . Anxiety   . Depression   . Hyperlipidemia   . Sleep apnea     Family History  Problem Relation Age of Onset  . Asthma Father   . Emphysema Father   . Heart disease Paternal Grandfather   . Heart disease Paternal Grandmother   . Breast cancer Maternal Grandmother   . Colon cancer Maternal Grandfather     Social History  Socioeconomic History  . Marital status: Married    Spouse name: Not on file  . Number of children: 1  . Years of education: Not on file  . Highest education level: Not on file  Occupational History  . Occupation: Doctor, hospital  . Financial resource strain: Not on file  . Food insecurity:    Worry: Not on file    Inability: Not on file  . Transportation needs:    Medical: Not on file    Non-medical: Not on file  Tobacco Use  . Smoking status: Former Smoker    Packs/day: 1.00    Years: 7.00     Pack years: 7.00    Types: Cigarettes    Last attempt to quit: 07/11/1993    Years since quitting: 24.9  . Smokeless tobacco: Never Used  Substance and Sexual Activity  . Alcohol use: Yes    Comment: 1-2 drinks daily  . Drug use: No  . Sexual activity: Not on file  Lifestyle  . Physical activity:    Days per week: Not on file    Minutes per session: Not on file  . Stress: Not on file  Relationships  . Social connections:    Talks on phone: Not on file    Gets together: Not on file    Attends religious service: Not on file    Active member of club or organization: Not on file    Attends meetings of clubs or organizations: Not on file    Relationship status: Not on file  . Intimate partner violence:    Fear of current or ex partner: Not on file    Emotionally abused: Not on file    Physically abused: Not on file    Forced sexual activity: Not on file  Other Topics Concern  . Not on file  Social History Narrative  . Not on file    Past Medical History, Surgical history, Social history, and Family history were reviewed and updated as appropriate.   Son Zachary Dixon.  Please see review of systems for further details on the patient's review from today.   Objective:   Physical Exam:  There were no vitals taken for this visit.  Physical Exam Constitutional:      General: He is not in acute distress.    Appearance: Normal appearance. He is well-developed.  Musculoskeletal:        General: No deformity.  Neurological:     Mental Status: He is alert and oriented to person, place, and time.     Motor: No tremor.     Coordination: Coordination normal.     Gait: Gait normal.  Psychiatric:        Attention and Perception: Attention and perception normal.        Mood and Affect: Mood is anxious. Mood is not depressed. Affect is not labile, blunt, angry or inappropriate.        Speech: Speech normal.        Behavior: Behavior normal.        Thought Content: Thought content  normal. Thought content does not include homicidal or suicidal ideation. Thought content does not include homicidal or suicidal plan.        Cognition and Memory: Cognition normal.     Comments: Insight and judgment fair. No auditory or visual hallucinations. No delusions.      Lab Review:     Component Value Date/Time   NA 141 08/26/2016 1959   K 4.0  08/26/2016 1959   CL 112 (H) 08/26/2016 1959   CO2 24 08/26/2016 1959   GLUCOSE 106 (H) 08/26/2016 1959   BUN 19 08/26/2016 1959   CREATININE 1.06 08/26/2016 1959   CALCIUM 8.8 (L) 08/26/2016 1959   PROT 6.7 08/26/2016 1959   ALBUMIN 3.8 08/26/2016 1959   AST 18 08/26/2016 1959   ALT 21 08/26/2016 1959   ALKPHOS 60 08/26/2016 1959   BILITOT 0.6 08/26/2016 1959   GFRNONAA >60 08/26/2016 1959   GFRAA >60 08/26/2016 1959       Component Value Date/Time   WBC 8.6 08/26/2016 1959   RBC 4.35 08/26/2016 1959   HGB 13.7 08/26/2016 1959   HCT 39.8 08/26/2016 1959   PLT 221 08/26/2016 1959   MCV 91.5 08/26/2016 1959   MCH 31.5 08/26/2016 1959   MCHC 34.4 08/26/2016 1959   RDW 12.6 08/26/2016 1959   LYMPHSABS 2.4 08/26/2016 1959   MONOABS 0.4 08/26/2016 1959   EOSABS 0.2 08/26/2016 1959   BASOSABS 0.0 08/26/2016 1959    Lithium Lvl  Date Value Ref Range Status  04/02/2018 0.7 0.6 - 1.2 mmol/L Final     Lab Results  Component Value Date   VALPROATE 54.6 04/02/2018     .res Assessment: Plan:    Bipolar I disorder, mild, current or most recent episode depressed, with rapid cycling ([place])  Attention deficit hyperactivity disorder (ADHD), predominantly inattentive type  Generalized anxiety disorder  Low vitamin D level  Restless legs syndrome   Greater than 50% of face to face time with patient was spent on counseling and coordination of care. 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  His symptoms have been  severe and very resistant to any benefit.  He is noticed no change off the bupropion.  He cannot tolerate an increase in the Depakote based on prior ataxic experiences at 1500 mg daily.  Higher doses of lithium have not helped anymore.  He has failed multiple medications and ECT and [place].  Has so far gotten benefit from the Vraylar 1.5 mg.  Disc that this is possibly low for a mood stabilizing dose and if he starts swinging then call and we'll increase it.  No evidence for mania. Discussed potential metabolic side effects associated with atypical antipsychotics, as well as potential risk for movement side effects. Advised pt to contact office if movement side effects occur.   ADD focus seems better with the higher dosage of [name] and he's tolerating it.  Discussed potential benefits, risks, and side effects of stimulants with patient to include increased heart rate, palpitations, insomnia, increased anxiety, increased irritability, or decreased appetite.  Instructed patient to contact office if experiencing any significant tolerability issues.  Continue vitamin D  Pramipexole prn for RLS   This appt was 30 mins.  FU 1 month.  Lynder Parents, MD, DFAPA   Please see After Visit Summary for patient specific instructions.  No future appointments.  No orders of the defined types were placed in this encounter.     -------------------------------

## 2018-07-12 DIAGNOSIS — J329 Chronic sinusitis, unspecified: Secondary | ICD-10-CM | POA: Diagnosis not present

## 2018-07-19 DIAGNOSIS — F331 Major depressive disorder, recurrent, moderate: Secondary | ICD-10-CM | POA: Diagnosis not present

## 2018-07-23 ENCOUNTER — Telehealth: Payer: Self-pay | Admitting: Psychiatry

## 2018-07-23 MED ORDER — CARIPRAZINE HCL 3 MG PO CAPS: 3.0000 mg | ORAL_CAPSULE | Freq: Every day | ORAL | 1 refills | Status: DC

## 2018-07-23 NOTE — Telephone Encounter (Signed)
rx's submitted to pharmacy

## 2018-07-23 NOTE — Telephone Encounter (Signed)
Needs refill of [name]70mg , vrylar 3mg  thanks

## 2018-07-24 ENCOUNTER — Other Ambulatory Visit: Payer: Self-pay | Admitting: Psychiatry

## 2018-07-26 DIAGNOSIS — F331 Major depressive disorder, recurrent, moderate: Secondary | ICD-10-CM | POA: Diagnosis not present

## 2018-08-02 DIAGNOSIS — F331 Major depressive disorder, recurrent, moderate: Secondary | ICD-10-CM | POA: Diagnosis not present

## 2018-08-09 ENCOUNTER — Ambulatory Visit (INDEPENDENT_AMBULATORY_CARE_PROVIDER_SITE_OTHER): Payer: BLUE CROSS/BLUE SHIELD | Admitting: Psychiatry

## 2018-08-09 ENCOUNTER — Encounter: Payer: Self-pay | Admitting: Psychiatry

## 2018-08-09 VITALS — BP 146/89 | HR 80

## 2018-08-09 DIAGNOSIS — G4733 Obstructive sleep apnea (adult) (pediatric): Secondary | ICD-10-CM | POA: Diagnosis not present

## 2018-08-09 DIAGNOSIS — F900 Attention-deficit hyperactivity disorder, predominantly inattentive type: Secondary | ICD-10-CM

## 2018-08-09 DIAGNOSIS — F902 Attention-deficit hyperactivity disorder, combined type: Secondary | ICD-10-CM | POA: Diagnosis not present

## 2018-08-09 DIAGNOSIS — G2581 Restless legs syndrome: Secondary | ICD-10-CM

## 2018-08-09 DIAGNOSIS — R7989 Other specified abnormal findings of blood chemistry: Secondary | ICD-10-CM

## 2018-08-09 DIAGNOSIS — F331 Major depressive disorder, recurrent, moderate: Secondary | ICD-10-CM | POA: Diagnosis not present

## 2018-08-09 DIAGNOSIS — Z79899 Other long term (current) drug therapy: Secondary | ICD-10-CM

## 2018-08-09 DIAGNOSIS — F3163 Bipolar disorder, current episode mixed, severe, without psychotic features: Secondary | ICD-10-CM | POA: Diagnosis not present

## 2018-08-09 DIAGNOSIS — F411 Generalized anxiety disorder: Secondary | ICD-10-CM

## 2018-08-09 MED ORDER — CARIPRAZINE HCL 3 MG PO CAPS: 3.0000 mg | ORAL_CAPSULE | Freq: Every day | ORAL | 1 refills | Status: DC

## 2018-08-09 MED ORDER — LISDEXAMFETAMINE DIMESYLATE 70 MG PO CAPS: 70.0000 mg | ORAL_CAPSULE | Freq: Every day | ORAL | 0 refills | Status: DC

## 2018-08-09 NOTE — Patient Instructions (Signed)
Due to increased Vraylar to 3 mg daily for mood  Get lithium level and blood tests for vitamin D.  Try to get the lithium test approximately 12 hours from taking the last dosage

## 2018-08-09 NOTE — Progress Notes (Signed)
Zachary Dixon [id number] 06/04/1966 53 y.o.  Subjective:   Patient ID:  Zachary Dixon is a 53 y.o. (DOB Apr 21, 1966) male.  Chief Complaint:  Chief Complaint  Patient presents with  . 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    His call wife, [name] called stating that he had been very flat and "doing really bad" with regard to mood.  He is sleeping excessively and is irritable and angry.  She is concerned he may not give accurate information because he does not want to return to ECT.  Sleep is a mess.  CPAP not working.  Very tired all the time.  Sleeping a lot. Hesitant to talk much about mood bc sleep problems affecting mood.  No Suicidal intent, fleeting death thoughts.  Sore throat in the morning.  He remains very depressed.  Appt with sleep doc in 2 weeks.  Started Vraylar 1.5 on 11/8.  Also increased [name] to 70 mg.  This made a noticeable boost.  Vraylar first week or so felt a little jumpy and hyper but resolved.  Vraylar helped mood initially but is gotten worse since then..   Still situational anxiety and irritable at times.  Better recognizes this.   Compliant with meds.  Doing DBT.  But not following through with all of it.  [name] had sinus surgery in [place] recovered.  Past psychiatric med medication trials include failure of ECT and TMS, Latuda 120 mg, Trintellix, aripiprazole, olanzapine 20 mg, Symbyax Seroquel, Rexulti, pramipexole, lamotrigine, duloxetine, Depakote 1500 ataxia, Pristiq, sertraline, bupropion 450 mg caused anxiety, Lexapro, Viibryd, [name], Concerta 72 mg, Deplin  Review of Systems:  Review of Systems  Constitutional: Positive for fatigue.  HENT: Positive for sore throat.   Gastrointestinal: Positive for nausea.  Neurological: Positive for tremors.  Negative for weakness.  Psychiatric/Behavioral: Positive for decreased concentration and dysphoric mood. Negative for agitation, behavioral problems, confusion, hallucinations, self-injury, sleep disturbance and suicidal ideas. The patient is nervous/anxious. The patient is not hyperactive.     Medications: I have reviewed the patient's current medications.  Current Outpatient Medications  Medication Sig Dispense Refill  . cariprazine (VRAYLAR) capsule Take 1 capsule (3 mg total) by mouth daily. 30 capsule 1  . divalproex (DEPAKOTE ER) 500 MG 24 hr tablet TAKE 2 TABLETS BY MOUTH EVERY DAY AT BEDTIME 60 tablet 1  . levothyroxine (SYNTHROID, LEVOTHROID) 25 MCG tablet Take 50 mcg by mouth every morning.     Derrill Memo ON 09/06/2018] lisdexamfetamine ([name]) 70 MG capsule Take 1 capsule (70 mg total) by mouth daily. 30 capsule 0  . lisdexamfetamine ([name]) 70 MG capsule Take 1 capsule (70 mg total) by mouth daily. 30 capsule 0  . lithium carbonate (LITHOBID) 300 MG CR tablet TAKE 3 TABLETS BY MOUTH EVERY DAY AT BEDTIME 90 tablet 1  . metFORMIN (GLUCOPHAGE) 500 MG tablet Take 2 tablets (1,000 mg total) by mouth 2 (two) times daily. 120 tablet 2  . Omega-3 1000 MG CAPS Take by mouth.    Marland Kitchen omeprazole (PRILOSEC) 40 MG capsule Take 40 mg by mouth daily.    . pramipexole (MIRAPEX) 1 MG tablet Take 2-3 mg by mouth daily as needed (restless legs).     . traZODone (DESYREL) 50 MG tablet Take 50-150 mg by mouth at bedtime.    Marland Kitchen  vitamin B-12 (CYANOCOBALAMIN) 1000 MCG tablet Take by mouth.    . Vitamin D, Ergocalciferol, (DRISDOL) 50000 units CAPS capsule Take 50,000 Units by mouth 2 (two) times a week.     No current facility-administered medications for this visit.     Medication Side Effects: None  Tremor not gone but less  Allergies: No Known Allergies  Past Medical History:  Diagnosis Date  . Allergy   . Anxiety   . Depression   . Hyperlipidemia   . Sleep apnea     Family History   Problem Relation Age of Onset  . Asthma Father   . Emphysema Father   . Heart disease Paternal Grandfather   . Heart disease Paternal Grandmother   . Breast cancer Maternal Grandmother   . Colon cancer Maternal Grandfather     Social History   Socioeconomic History  . Marital status: Married    Spouse name: Not on file  . Number of children: 1  . Years of education: Not on file  . Highest education level: Not on file  Occupational History  . Occupation: Doctor, hospital  . Financial resource strain: Not on file  . Food insecurity:    Worry: Not on file    Inability: Not on file  . Transportation needs:    Medical: Not on file    Non-medical: Not on file  Tobacco Use  . Smoking status: Former Smoker    Packs/day: 1.00    Years: 7.00    Pack years: 7.00    Types: Cigarettes    Last attempt to quit: 07/11/1993    Years since quitting: 25.0  . Smokeless tobacco: Never Used  Substance and Sexual Activity  . Alcohol use: Yes    Comment: 1-2 drinks daily  . Drug use: No  . Sexual activity: Not on file  Lifestyle  . Physical activity:    Days per week: Not on file    Minutes per session: Not on file  . Stress: Not on file  Relationships  . Social connections:    Talks on phone: Not on file    Gets together: Not on file    Attends religious service: Not on file    Active member of club or organization: Not on file    Attends meetings of clubs or organizations: Not on file    Relationship status: Not on file  . Intimate partner violence:    Fear of current or ex partner: Not on file    Emotionally abused: Not on file    Physically abused: Not on file    Forced sexual activity: Not on file  Other Topics Concern  . Not on file  Social History Narrative  . Not on file    Past Medical History, Surgical history, Social history, and Family history were reviewed and updated as appropriate.   Son sam.  Please see review of systems for further details on the  patient's review from today.   Objective:   Physical Exam:  BP (!) 146/89 (BP Location: Left Arm)   Pulse 80   Physical Exam Constitutional:      General: He is not in acute distress.    Appearance: Normal appearance. He is well-developed.  Musculoskeletal:        General: No deformity.  Neurological:     Mental Status: He is alert and oriented to person, place, and time.     Motor: No tremor.     Coordination: Coordination normal.  Gait: Gait normal.  Psychiatric:        Attention and Perception: Attention and perception normal.        Mood and Affect: Mood is anxious and depressed. Affect is not labile, blunt, angry or inappropriate.        Speech: Speech normal.        Behavior: Behavior normal.        Thought Content: Thought content normal. Thought content does not include homicidal or suicidal ideation. Thought content does not include homicidal or suicidal plan.        Cognition and Memory: Cognition normal.     Comments: Insight and judgment fair. No auditory or visual hallucinations. No delusions.  Irritable mood.     Lab Review:     Component Value Date/Time   NA 141 08/26/2016 1959   K 4.0 08/26/2016 1959   CL 112 (H) 08/26/2016 1959   CO2 24 08/26/2016 1959   GLUCOSE 106 (H) 08/26/2016 1959   BUN 19 08/26/2016 1959   CREATININE 1.06 08/26/2016 1959   CALCIUM 8.8 (L) 08/26/2016 1959   PROT 6.7 08/26/2016 1959   ALBUMIN 3.8 08/26/2016 1959   AST 18 08/26/2016 1959   ALT 21 08/26/2016 1959   ALKPHOS 60 08/26/2016 1959   BILITOT 0.6 08/26/2016 1959   GFRNONAA >60 08/26/2016 1959   GFRAA >60 08/26/2016 1959       Component Value Date/Time   WBC 8.6 08/26/2016 1959   RBC 4.35 08/26/2016 1959   HGB 13.7 08/26/2016 1959   HCT 39.8 08/26/2016 1959   PLT 221 08/26/2016 1959   MCV 91.5 08/26/2016 1959   MCH 31.5 08/26/2016 1959   MCHC 34.4 08/26/2016 1959   RDW 12.6 08/26/2016 1959   LYMPHSABS 2.4 08/26/2016 1959   MONOABS 0.4 08/26/2016 1959    EOSABS 0.2 08/26/2016 1959   BASOSABS 0.0 08/26/2016 1959    Lithium Lvl  Date Value Ref Range Status  04/02/2018 0.7 0.6 - 1.2 mmol/L Final     Lab Results  Component Value Date   VALPROATE 54.6 04/02/2018     .res Assessment: Plan:    Bipolar 1 disorder, mixed, severe ([place]) - Plan: cariprazine (VRAYLAR) capsule, Lithium level  Attention deficit hyperactivity disorder (ADHD), combined type - Plan: lisdexamfetamine ([name]) 70 MG capsule, lisdexamfetamine ([name]) 70 MG capsule  Low vitamin D level - Plan: Vitamin D 1,25 dihydroxy  Obstructive sleep apnea  Attention deficit hyperactivity disorder (ADHD), predominantly inattentive type  Generalized anxiety disorder  Restless legs syndrome  Lithium use - Plan: Lithium level, Basic metabolic panel   Greater than 50% of face to face time with patient was spent on counseling and coordination of care. 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  His symptoms have been severe and very resistant to any benefit.    Currently he has untreated sleep apnea of uncertain severity but he is seeing a sleep specialist in a couple of weeks.  That is obviously having a negative effect on his mood and daytime functioning.  He is actually hopeful about addressing the sleep apnea.  Marland Kitchen  He cannot tolerate an increase in the Depakote based on prior ataxic experiences at 1500 mg daily.  Higher doses of lithium have not helped anymore.  He has failed multiple medications and ECT and [place]. We will check lithium level and BMP.  TSH was normal in the spring.  Depakote levels  have been in the low normal range.  Has so far gotten benefit from the Vraylar 1.5 mg.  we'll increase it to 3mg  daily.  No evidence for mania but possibly mixed symptoms. Discussed potential metabolic side effects associated with atypical antipsychotics, as well as potential  risk for movement side effects. Advised pt to contact office if movement side effects occur.   ADD focus seems better with the higher dosage of [name] and he's tolerating it.  Discussed potential benefits, risks, and side effects of stimulants with patient to include increased heart rate, palpitations, insomnia, increased anxiety, increased irritability, or decreased appetite.  Instructed patient to contact office if experiencing any significant tolerability issues.  Continue vitamin D check level.  Pramipexole prn for RLS   This appt was 30 mins.  FU 1 month.  Lynder Parents, MD, DFAPA   Please see After Visit Summary for patient specific instructions.  No future appointments.  Orders Placed This Encounter  Procedures  . Vitamin D 1,25 dihydroxy  . Lithium level  . Basic metabolic panel      -------------------------------

## 2018-08-16 DIAGNOSIS — F331 Major depressive disorder, recurrent, moderate: Secondary | ICD-10-CM | POA: Diagnosis not present

## 2018-08-19 ENCOUNTER — Other Ambulatory Visit: Payer: Self-pay | Admitting: Psychiatry

## 2018-08-20 DIAGNOSIS — G4733 Obstructive sleep apnea (adult) (pediatric): Secondary | ICD-10-CM | POA: Diagnosis not present

## 2018-08-23 DIAGNOSIS — F331 Major depressive disorder, recurrent, moderate: Secondary | ICD-10-CM | POA: Diagnosis not present

## 2018-08-25 ENCOUNTER — Other Ambulatory Visit: Payer: Self-pay | Admitting: Psychiatry

## 2018-08-31 DIAGNOSIS — G4733 Obstructive sleep apnea (adult) (pediatric): Secondary | ICD-10-CM | POA: Diagnosis not present

## 2018-09-03 ENCOUNTER — Other Ambulatory Visit: Payer: Self-pay

## 2018-09-03 MED ORDER — TRAZODONE HCL 50 MG PO TABS: 50.0000 mg | ORAL_TABLET | Freq: Every day | ORAL | 1 refills | Status: DC

## 2018-09-03 NOTE — Progress Notes (Signed)
Refill request from [facility] [address] for trazodone 50mg  1-3 tablets at bedtime   last office visit 08/09/2018 Next office visit 09/21/2018

## 2018-09-06 DIAGNOSIS — K219 Gastro-esophageal reflux disease without esophagitis: Secondary | ICD-10-CM | POA: Diagnosis not present

## 2018-09-06 DIAGNOSIS — J392 Other diseases of pharynx: Secondary | ICD-10-CM | POA: Diagnosis not present

## 2018-09-06 DIAGNOSIS — F331 Major depressive disorder, recurrent, moderate: Secondary | ICD-10-CM | POA: Diagnosis not present

## 2018-09-06 DIAGNOSIS — F3175 Bipolar disorder, in partial remission, most recent episode depressed: Secondary | ICD-10-CM | POA: Diagnosis not present

## 2018-09-06 DIAGNOSIS — E039 Hypothyroidism, unspecified: Secondary | ICD-10-CM | POA: Diagnosis not present

## 2018-09-06 DIAGNOSIS — Z79899 Other long term (current) drug therapy: Secondary | ICD-10-CM | POA: Diagnosis not present

## 2018-09-07 ENCOUNTER — Telehealth: Payer: Self-pay | Admitting: Psychiatry

## 2018-09-07 NOTE — Telephone Encounter (Signed)
TC  Taking lithium all at HS 900mg .  Lately more tremor.  No other med changes.  No NSAIDs lately.  No problems with balance nor confusion.    No lithium tonight. Restart lithium 600 mg tomorrow night.  Anytime you see an increase in tremor then check lithium level right away.  Lithium level 2.1 on September 06, 2018 at Dr. Joyce Copa office. TSH normal at 3.3 Free T4 normal at 1.1 BMP normal with creatinine 1.0 and calcium 9.2 CBC normal  Lynder Parents, MD, DFAPA

## 2018-09-13 ENCOUNTER — Telehealth: Payer: Self-pay | Admitting: Psychiatry

## 2018-09-13 DIAGNOSIS — F331 Major depressive disorder, recurrent, moderate: Secondary | ICD-10-CM | POA: Diagnosis not present

## 2018-09-13 NOTE — Telephone Encounter (Signed)
Patient called and has a question about his vraylar. Please give him a call at [phone]

## 2018-09-13 NOTE — Telephone Encounter (Signed)
Saw PCP, has been experiencing a "gag reflex" he feels for about 3-4 months, since starting Vraylar. PCP mentioned possibly some antidepressants can cause this. He says it's random and sporadic. Often times it's when he's wearing his reading glasses???

## 2018-09-14 NOTE — Telephone Encounter (Signed)
I cannot say that it is impossible that Zachary Dixon is causing his correct gag reflex.  However is clearly not a common side effect either.  This is the primary medication that he is taking for his mood at present and he has failed multiple other medications.  It seems very unwise to discontinue Vraylar until has had an adequate time to help his mood and until his next appointment given the multiple other med failures and this is a very unique potentially promising medication.  My suggestion is he continue the Vraylar until his next appointment with me.  If the gag reflex problem becomes intolerable then move his appointment up.  Lynder Parents, MD, DFAPA

## 2018-09-17 ENCOUNTER — Other Ambulatory Visit: Payer: Self-pay | Admitting: Psychiatry

## 2018-09-17 NOTE — Telephone Encounter (Signed)
Unable to reach pt or leave a voicemail will try back later

## 2018-09-19 NOTE — Telephone Encounter (Signed)
Pt agreed and will follow up on 10/11/2018. Instructed to call back with any problems before appt.

## 2018-09-20 DIAGNOSIS — F331 Major depressive disorder, recurrent, moderate: Secondary | ICD-10-CM | POA: Diagnosis not present

## 2018-09-21 ENCOUNTER — Ambulatory Visit: Payer: BLUE CROSS/BLUE SHIELD | Admitting: Psychiatry

## 2018-09-27 DIAGNOSIS — F331 Major depressive disorder, recurrent, moderate: Secondary | ICD-10-CM | POA: Diagnosis not present

## 2018-10-04 DIAGNOSIS — F331 Major depressive disorder, recurrent, moderate: Secondary | ICD-10-CM | POA: Diagnosis not present

## 2018-10-11 ENCOUNTER — Encounter: Payer: Self-pay | Admitting: Psychiatry

## 2018-10-11 ENCOUNTER — Ambulatory Visit (INDEPENDENT_AMBULATORY_CARE_PROVIDER_SITE_OTHER): Payer: BLUE CROSS/BLUE SHIELD | Admitting: Psychiatry

## 2018-10-11 ENCOUNTER — Other Ambulatory Visit: Payer: Self-pay

## 2018-10-11 DIAGNOSIS — F902 Attention-deficit hyperactivity disorder, combined type: Secondary | ICD-10-CM

## 2018-10-11 DIAGNOSIS — F331 Major depressive disorder, recurrent, moderate: Secondary | ICD-10-CM | POA: Diagnosis not present

## 2018-10-11 DIAGNOSIS — Z79899 Other long term (current) drug therapy: Secondary | ICD-10-CM

## 2018-10-11 DIAGNOSIS — G4733 Obstructive sleep apnea (adult) (pediatric): Secondary | ICD-10-CM

## 2018-10-11 DIAGNOSIS — G2581 Restless legs syndrome: Secondary | ICD-10-CM | POA: Diagnosis not present

## 2018-10-11 DIAGNOSIS — R7989 Other specified abnormal findings of blood chemistry: Secondary | ICD-10-CM

## 2018-10-11 DIAGNOSIS — F3163 Bipolar disorder, current episode mixed, severe, without psychotic features: Secondary | ICD-10-CM | POA: Diagnosis not present

## 2018-10-11 MED ORDER — LIOTHYRONINE SODIUM 25 MCG PO TABS: ORAL_TABLET | ORAL | 1 refills | Status: DC

## 2018-10-11 NOTE — Progress Notes (Signed)
Zachary Dixon [id number] January 11, 1966 53 y.o.  Subjective:   Patient ID:  Zachary Dixon is a 52 y.o. (DOB 24-Oct-1965) male.  Chief Complaint:  Chief Complaint  Patient presents with  . Depression  . Anxiety  . Sleeping Problem    HPI Wife joined. 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    Patient was last seen August 09, 2018.  Vraylar was increased from 1.5 to 3 mg at that time.  He had been on 1.5 for about a couple of months.  Still feels up and down but not deep depression but flat still, maybe a little worse.  Pt reports that mood is Anxious, Depressed and so flat per wife and describes anxiety as Moderate. Anxiety symptoms include: Excessive Worry,. Pt reports sleeps excessively. Pt reports that appetite is good. Pt reports that energy is lethargic and poor and loss of interest or pleasure in usual activities, poor motivation and withdrawn from usual activities. Concentration is down slightly. Suicidal thoughts:  denied by patient  Work function ok.  .   His call wife, Zachary Dixon called stating that he had been very flat like last time and "doing really bad" with regard to mood.  He is sleeping excessively.  She is concerned he may not give accurate information because he does not want to return to ECT.  Sleep is a mess.  CPAP not working.  Very tired all the time.  Sleeping a lot. Hesitant to talk much about mood bc sleep problems affecting mood.  No Suicidal intent, fleeting death thoughts.  Sore throat in the morning.  He remains very depressed.  Started Vraylar 1.5 on 11/8.  Also increased [name] to 70 mg.  This made a noticeable boost.  Vraylar first week or so felt a little jumpy and hyper but resolved.  Vraylar helped mood initially but is gotten worse since then..   Still situational anxiety and irritable at times.  Better recognizes this.   Compliant with meds.  Doing  DBT.  But not following through with all of it.  Past psychiatric med medication trials include failure of ECT and TMS, Latuda 120 mg, Trintellix, aripiprazole, olanzapine 20 mg, Symbyax Seroquel, Rexulti, pramipexole, lamotrigine, duloxetine, Depakote 1500 ataxia, Pristiq, sertraline, bupropion 450 mg caused anxiety, Lexapro, Viibryd, [name], Concerta 72 mg, Deplin, lithium, Vraylar 3  Review of Systems:  Review of Systems  Constitutional: Negative for fatigue.  HENT: Negative for sore throat.   Gastrointestinal: Positive for nausea.       Gagging comes and goes  Neurological: Positive for tremors. Negative for weakness.  Psychiatric/Behavioral: Positive for decreased concentration and dysphoric mood. Negative for agitation, behavioral problems, confusion, hallucinations, self-injury, sleep disturbance and suicidal ideas. The patient is nervous/anxious. The patient is not hyperactive.     Medications: I have reviewed the patient's current medications.  Current Outpatient Medications  Medication Sig Dispense Refill  . cariprazine (VRAYLAR) capsule Take 1 capsule (3 mg total) by mouth daily. 30 capsule 1  . divalproex (DEPAKOTE ER) 500 MG 24 hr tablet TAKE 2 TABLETS BY MOUTH EVERY DAY AT BEDTIME 60 tablet 1  . levothyroxine (SYNTHROID, LEVOTHROID) 25 MCG tablet Take 50 mcg by mouth every morning.     . lisdexamfetamine ([name]) 70 MG capsule Take 1 capsule (70 mg total) by mouth daily. 30 capsule 0  . lisdexamfetamine ([name]) 70 MG capsule Take 1 capsule (70 mg total) by mouth  daily. 30 capsule 0  . lithium carbonate (LITHOBID) 300 MG CR tablet TAKE 3 TABLETS BY MOUTH AT BEDTIME 90 tablet 0  . metFORMIN (GLUCOPHAGE) 500 MG tablet Take 2 tablets (1,000 mg total) by mouth 2 (two) times daily. 120 tablet 2  . Omega-3 1000 MG CAPS Take by mouth.    Marland Kitchen omeprazole (PRILOSEC) 40 MG capsule Take 40 mg by mouth daily.    . traZODone (DESYREL) 50 MG tablet Take 1-3 tablets (50-150 mg total) by  mouth at bedtime. 90 tablet 1  . vitamin B-12 (CYANOCOBALAMIN) 1000 MCG tablet Take by mouth.    . Vitamin D, Ergocalciferol, (DRISDOL) 1.25 MG (50000 UT) CAPS capsule TAKE ONE CAPSULE EVERY SUNDAY AND THURSDAY FOR 4 WEEKS, THEN 1 CAPSULE WEEKLY 5 capsule 1  . liothyronine (CYTOMEL) 25 MCG tablet 1 tablet daily for 10 days in the morning then 2 tablets each morning 60 tablet 1  . pramipexole (MIRAPEX) 1 MG tablet Take 2-3 mg by mouth daily as needed (restless legs).      No current facility-administered medications for this visit.     Medication Side Effects: None  Tremor not gone but less  Allergies: No Known Allergies  Past Medical History:  Diagnosis Date  . Allergy   . Anxiety   . Depression   . Hyperlipidemia   . Sleep apnea     Family History  Problem Relation Age of Onset  . Asthma Father   . Emphysema Father   . Heart disease Paternal Grandfather   . Heart disease Paternal Grandmother   . Breast cancer Maternal Grandmother   . Colon cancer Maternal Grandfather     Social History   Socioeconomic History  . Marital status: Married    Spouse name: Not on file  . Number of children: 1  . Years of education: Not on file  . Highest education level: Not on file  Occupational History  . Occupation: Doctor, hospital  . Financial resource strain: Not on file  . Food insecurity:    Worry: Not on file    Inability: Not on file  . Transportation needs:    Medical: Not on file    Non-medical: Not on file  Tobacco Use  . Smoking status: Former Smoker    Packs/day: 1.00    Years: 7.00    Pack years: 7.00    Types: Cigarettes    Last attempt to quit: 07/11/1993    Years since quitting: 25.2  . Smokeless tobacco: Never Used  Substance and Sexual Activity  . Alcohol use: Yes    Comment: 1-2 drinks daily  . Drug use: No  . Sexual activity: Not on file  Lifestyle  . Physical activity:    Days per week: Not on file    Minutes per session: Not on file  .  Stress: Not on file  Relationships  . Social connections:    Talks on phone: Not on file    Gets together: Not on file    Attends religious service: Not on file    Active member of club or organization: Not on file    Attends meetings of clubs or organizations: Not on file    Relationship status: Not on file  . Intimate partner violence:    Fear of current or ex partner: Not on file    Emotionally abused: Not on file    Physically abused: Not on file    Forced sexual activity: Not on file  Other  Topics Concern  . Not on file  Social History Narrative  . Not on file    Past Medical History, Surgical history, Social history, and Family history were reviewed and updated as appropriate.   Son sam.  Please see review of systems for further details on the patient's review from today.   Objective:   Physical Exam:  There were no vitals taken for this visit.  Physical Exam Neurological:     Mental Status: He is alert and oriented to person, place, and time.     Cranial Nerves: No dysarthria.  Psychiatric:        Attention and Perception: He is inattentive. He does not perceive auditory hallucinations.        Mood and Affect: Mood is anxious and depressed. Affect is blunt.        Speech: Speech normal.        Behavior: Behavior is not agitated or aggressive. Behavior is cooperative.        Thought Content: Thought content normal. Thought content is not paranoid or delusional. Thought content does not include homicidal or suicidal ideation. Thought content does not include homicidal or suicidal plan.        Cognition and Memory: Cognition and memory normal.        Judgment: Judgment normal.     Comments: His compliance appears to have improved somewhat over time and he appears less labile but very flat according to both he and his wife.     Lab Review:     Component Value Date/Time   NA 141 08/26/2016 1959   K 4.0 08/26/2016 1959   CL 112 (H) 08/26/2016 1959   CO2 24  08/26/2016 1959   GLUCOSE 106 (H) 08/26/2016 1959   BUN 19 08/26/2016 1959   CREATININE 1.06 08/26/2016 1959   CALCIUM 8.8 (L) 08/26/2016 1959   PROT 6.7 08/26/2016 1959   ALBUMIN 3.8 08/26/2016 1959   AST 18 08/26/2016 1959   ALT 21 08/26/2016 1959   ALKPHOS 60 08/26/2016 1959   BILITOT 0.6 08/26/2016 1959   GFRNONAA >60 08/26/2016 1959   GFRAA >60 08/26/2016 1959       Component Value Date/Time   WBC 8.6 08/26/2016 1959   RBC 4.35 08/26/2016 1959   HGB 13.7 08/26/2016 1959   HCT 39.8 08/26/2016 1959   PLT 221 08/26/2016 1959   MCV 91.5 08/26/2016 1959   MCH 31.5 08/26/2016 1959   MCHC 34.4 08/26/2016 1959   RDW 12.6 08/26/2016 1959   LYMPHSABS 2.4 08/26/2016 1959   MONOABS 0.4 08/26/2016 1959   EOSABS 0.2 08/26/2016 1959   BASOSABS 0.0 08/26/2016 1959    Lithium Lvl  Date Value Ref Range Status  04/02/2018 0.7 0.6 - 1.2 mmol/L Final     Lab Results  Component Value Date   VALPROATE 54.6 04/02/2018    TSH at 3 .res Assessment: Plan:    Bipolar 1 disorder, mixed, severe (HCC)  Attention deficit hyperactivity disorder (ADHD), combined type  Restless legs syndrome  Lithium use  Obstructive sleep apnea  Low vitamin D level   Greater than 50% of face to face time with patient was spent on counseling and coordination of care. 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  His symptoms have been severe and very resistant to any benefit.    Option Cytomel. Yes for potentiation.  25 mcg for 10 days, then 50  mcg daily.  His recent TSH was over 3.  This is a well-known way to potentiate treatment of depression.   Currently he has untreated sleep apnea of uncertain severity but reportedly was told it was not severe.  However his wife notes that his snoring rattles the walls.  I emphasized trial of CPAP for his mental health.  They will pursue this with the sleep  specialist.  That is obviously having a negative effect on his mood and daytime functioning.  He is actually hopeful about addressing the sleep apnea.  Trial of CPAP for mental health.  .  He cannot tolerate an increase in the Depakote based on prior ataxic experiences at 1500 mg daily.  Higher doses of lithium have not helped anymore.  He has failed multiple medications and ECT and [place]. Their primary concern today is the flatness.  He is not having extreme cycling.  His wife wonders if 1 of the medications might be contributing to the flatness.  Would like a longer trial of the Vraylar and the flatness preexisted Vraylar. Therefore decrease lithium to 600 mg nightly for 2 weeks. If the flatness does not resolve and as long as symptoms are not worse, then reduce the Depakote to 500 mg nightly to determine if it is causing flatness.   They are aware that mood cycling could get worse and if it does let us know..  I would like a longer trial of the Vraylar at 3 mg daily.  There is a question of whether is causing his occasional intermittent weird gagging symptom but he agrees to continue it at this dosage for now.  It appears that it may be helping the depression somewhat in reducing overall cycling. Discussed potential metabolic side effects associated with atypical antipsychotics, as well as potential risk for movement side effects. Advised pt to contact office if movement side effects occur.   ADD focus seems better with the higher dosage of [name] and he's tolerating it.  Discussed potential benefits, risks, and side effects of stimulants with patient to include increased heart rate, palpitations, insomnia, increased anxiety, increased irritability, or decreased appetite.  Instructed patient to contact office if experiencing any significant tolerability issues.  Continue vitamin D check level later.  Continue B12.  Pramipexole prn for RLS   This appt was 30 mins.  FU 1 month.  Lynder Parents,  MD, DFAPA   Please see After Visit Summary for patient specific instructions.  No future appointments.  No orders of the defined types were placed in this encounter.     -------------------------------

## 2018-10-15 ENCOUNTER — Other Ambulatory Visit: Payer: Self-pay | Admitting: Psychiatry

## 2018-10-18 DIAGNOSIS — F331 Major depressive disorder, recurrent, moderate: Secondary | ICD-10-CM | POA: Diagnosis not present

## 2018-10-23 ENCOUNTER — Telehealth: Payer: Self-pay | Admitting: Psychiatry

## 2018-10-23 ENCOUNTER — Other Ambulatory Visit: Payer: Self-pay

## 2018-10-23 DIAGNOSIS — F902 Attention-deficit hyperactivity disorder, combined type: Secondary | ICD-10-CM

## 2018-10-23 MED ORDER — LISDEXAMFETAMINE DIMESYLATE 70 MG PO CAPS: 70.0000 mg | ORAL_CAPSULE | Freq: Every day | ORAL | 0 refills | Status: DC

## 2018-10-23 NOTE — Telephone Encounter (Signed)
Pended for approval.

## 2018-10-23 NOTE — Telephone Encounter (Signed)
Zachary Dixon called to request refill of his vyvanse.  Next appt not yet scheduled. Seen 10/11/18. Send to [facility] [address]

## 2018-10-25 ENCOUNTER — Other Ambulatory Visit: Payer: Self-pay | Admitting: Psychiatry

## 2018-10-25 DIAGNOSIS — F331 Major depressive disorder, recurrent, moderate: Secondary | ICD-10-CM | POA: Diagnosis not present

## 2018-11-01 DIAGNOSIS — F331 Major depressive disorder, recurrent, moderate: Secondary | ICD-10-CM | POA: Diagnosis not present

## 2018-11-05 ENCOUNTER — Other Ambulatory Visit: Payer: Self-pay | Admitting: Psychiatry

## 2018-11-08 DIAGNOSIS — F331 Major depressive disorder, recurrent, moderate: Secondary | ICD-10-CM | POA: Diagnosis not present

## 2018-11-13 DIAGNOSIS — E038 Other specified hypothyroidism: Secondary | ICD-10-CM | POA: Diagnosis not present

## 2018-11-15 DIAGNOSIS — F331 Major depressive disorder, recurrent, moderate: Secondary | ICD-10-CM | POA: Diagnosis not present

## 2018-11-16 ENCOUNTER — Other Ambulatory Visit: Payer: Self-pay | Admitting: Psychiatry

## 2018-11-16 DIAGNOSIS — F3163 Bipolar disorder, current episode mixed, severe, without psychotic features: Secondary | ICD-10-CM

## 2018-11-22 ENCOUNTER — Telehealth: Payer: Self-pay | Admitting: Psychiatry

## 2018-11-22 NOTE — Telephone Encounter (Signed)
RF of [name] needed to [facility] [address].

## 2018-11-23 ENCOUNTER — Other Ambulatory Visit: Payer: Self-pay

## 2018-11-23 DIAGNOSIS — F902 Attention-deficit hyperactivity disorder, combined type: Secondary | ICD-10-CM

## 2018-11-23 MED ORDER — LISDEXAMFETAMINE DIMESYLATE 70 MG PO CAPS: 70.0000 mg | ORAL_CAPSULE | Freq: Every day | ORAL | 0 refills | Status: DC

## 2018-11-23 NOTE — Telephone Encounter (Signed)
Pended for approval.

## 2018-11-29 DIAGNOSIS — F331 Major depressive disorder, recurrent, moderate: Secondary | ICD-10-CM | POA: Diagnosis not present

## 2018-12-02 ENCOUNTER — Other Ambulatory Visit: Payer: Self-pay | Admitting: Psychiatry

## 2018-12-05 DIAGNOSIS — F331 Major depressive disorder, recurrent, moderate: Secondary | ICD-10-CM | POA: Diagnosis not present

## 2018-12-06 ENCOUNTER — Ambulatory Visit (INDEPENDENT_AMBULATORY_CARE_PROVIDER_SITE_OTHER): Payer: BLUE CROSS/BLUE SHIELD | Admitting: Psychiatry

## 2018-12-06 ENCOUNTER — Other Ambulatory Visit: Payer: Self-pay

## 2018-12-06 ENCOUNTER — Encounter: Payer: Self-pay | Admitting: Psychiatry

## 2018-12-06 DIAGNOSIS — F411 Generalized anxiety disorder: Secondary | ICD-10-CM | POA: Diagnosis not present

## 2018-12-06 DIAGNOSIS — F902 Attention-deficit hyperactivity disorder, combined type: Secondary | ICD-10-CM | POA: Diagnosis not present

## 2018-12-06 DIAGNOSIS — Z79899 Other long term (current) drug therapy: Secondary | ICD-10-CM

## 2018-12-06 DIAGNOSIS — G2581 Restless legs syndrome: Secondary | ICD-10-CM | POA: Diagnosis not present

## 2018-12-06 DIAGNOSIS — G4733 Obstructive sleep apnea (adult) (pediatric): Secondary | ICD-10-CM

## 2018-12-06 DIAGNOSIS — F319 Bipolar disorder, unspecified: Secondary | ICD-10-CM

## 2018-12-06 DIAGNOSIS — R7989 Other specified abnormal findings of blood chemistry: Secondary | ICD-10-CM

## 2018-12-06 MED ORDER — AMPHET-DEXTROAMPHET 3-BEAD ER 50 MG PO CP24: 1.0000 | ORAL_CAPSULE | ORAL | 0 refills | Status: DC

## 2018-12-06 NOTE — Progress Notes (Signed)
Zachary Dixon [id number] 1965/10/02 53 y.o.  Virtual Visit via Telephone Note  I connected with pt by telephone and verified that I am speaking with the correct person using two identifiers.   I discussed the limitations, risks, security and privacy concerns of performing an evaluation and management service by telephone and the availability of in person appointments. I also discussed with the patient that there may be a patient responsible charge related to this service. The patient expressed understanding and agreed to proceed.  I discussed the assessment and treatment plan with the patient. The patient was provided an opportunity to ask questions and all were answered. The patient agreed with the plan and demonstrated an understanding of the instructions.   The patient was advised to call back or seek an in-person evaluation if the symptoms worsen or if the condition fails to improve as anticipated.  I provided 30 minutes of non-face-to-face time during this encounter. The call started at 930 and ended at 10:00. The patient was located at home and the provider was located office.  Subjective:   Patient ID:  Zachary Dixon is a 53 y.o. (DOB 09-Aug-1965) male.  Chief Complaint:  Chief Complaint  Patient presents with  . Follow-up    Medication Management   . Anxiety    Medication Management   . Depression    Medication Management   . ADHD    Medication Management     Anxiety  Symptoms include decreased concentration, nausea and nervous/anxious behavior. Patient reports no confusion or suicidal ideas.    Depression         Associated symptoms include decreased concentration.  Associated symptoms include no fatigue and no suicidal ideas.  Past medical history includes anxiety.    Wife joined. 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    Last visit was October 11, 2018.   Cytomel potentiation was started for depression 25 mcg daily to increase to 50 mcg daily.  In addition there were concerns about flatness and he was wondering if it is related to side effects versus depression.  We reduced the lithium temporarily to see if that was the cause of the flatness.  If it did not improve he was to increase the lithium back up and then decrease the Depakote to 500 mg daily to see if that was the cause.  Reductions in lithium and depakote did not help the flatness.  No noticeable effect from the Cytomel except it did help the thyroid panel.  No changes in mood and no mood swings. Falls asleep early in chair is a big problem.  Trazodone helps sleep.  Getting into bed every night and not sleeping on sofa now.  In August 09, 2018.  Vraylar was increased from 1.5 to 3 mg at that time.  He had been on 1.5 for about a couple of months.  Still feels up and down but not deep depression but flat still, maybe a little worse.  Pt reports that mood is Anxious, Depressed and so flat per wife and describes anxiety as Moderate. Anxiety symptoms include: Excessive Worry,. Pt reports sleeps excessively. Pt reports that appetite is good. Pt reports that energy is lethargic and poor and loss of interest or pleasure in usual activities, poor motivation and withdrawn from usual activities. Concentration is better during the day until 3 pm. Suicidal thoughts:  denied by patient  Work function ok.  Marland Kitchen  Reading more lately.  His call wife, Zachary Dixon called stating that he had been very flat like last time and "doing really bad" with regard to mood.  He is sleeping excessively.  She is concerned he may not give accurate information because he does not want to return to ECT.  Uncertain about the current status of CPAP.  Started Vraylar 1.5 on 11/8.  Also increased [name] to 70 mg. No SE. This made a noticeable boost.  Vraylar first week or so felt a little jumpy and hyper but resolved.  Vraylar helped mood  initially but is gotten worse since then..   Still situational anxiety and irritable at times.  Better recognizes this.   Compliant with meds.  Doing DBT.  But not following through with all of it.  Past psychiatric med medication trials include failure of ECT and TMS, Latuda 120 mg, Trintellix, aripiprazole, olanzapine 20 mg, Symbyax Seroquel, Rexulti, pramipexole, lamotrigine, duloxetine, Depakote 1500 ataxia, Pristiq, sertraline, bupropion 450 mg caused anxiety, Lexapro, Viibryd, [name], Concerta 72 mg, Deplin, lithium, Vraylar 3  Review of Systems:  Review of Systems  Constitutional: Negative for fatigue.  HENT: Negative for sore throat.   Gastrointestinal: Positive for nausea.       Gagging comes and goes  Neurological: Positive for tremors. Negative for weakness.  Psychiatric/Behavioral: Positive for decreased concentration, depression and dysphoric mood. Negative for agitation, behavioral problems, confusion, hallucinations, self-injury, sleep disturbance and suicidal ideas. The patient is nervous/anxious. The patient is not hyperactive.     Medications: I have reviewed the patient's current medications.  Current Outpatient Medications  Medication Sig Dispense Refill  . divalproex (DEPAKOTE ER) 500 MG 24 hr tablet TAKE 2 TABLETS BY MOUTH AT BEDTIME (Patient taking differently: Take 500 mg by mouth daily. ) 60 tablet 5  . levothyroxine (SYNTHROID, LEVOTHROID) 25 MCG tablet Take 50 mcg by mouth every morning.     Marland Kitchen liothyronine (CYTOMEL) 25 MCG tablet TAKE 2 TABLETS EACH MORNING 60 tablet 0  . lisdexamfetamine ([name]) 70 MG capsule Take 1 capsule (70 mg total) by mouth daily. 30 capsule 0  . lisdexamfetamine ([name]) 70 MG capsule Take 1 capsule (70 mg total) by mouth daily. 30 capsule 0  . lithium carbonate (LITHOBID) 300 MG CR tablet TAKE 3 TABLETS BY MOUTH EVERY DAY AT BEDTIME (Patient taking differently: Take 600 mg by mouth at bedtime. ) 90 tablet 2  . metFORMIN (GLUCOPHAGE)  500 MG tablet Take 2 tablets (1,000 mg total) by mouth 2 (two) times daily. 120 tablet 2  . Omega-3 1000 MG CAPS Take by mouth.    Marland Kitchen omeprazole (PRILOSEC) 40 MG capsule Take 40 mg by mouth daily.    . pramipexole (MIRAPEX) 1 MG tablet Take 2-3 mg by mouth daily as needed (restless legs).     . traZODone (DESYREL) 50 MG tablet TAKE 1-3 TABLETS BY MOUTH AT BEDTIME. 90 tablet 1  . vitamin B-12 (CYANOCOBALAMIN) 1000 MCG tablet Take by mouth.    . Vitamin D, Ergocalciferol, (DRISDOL) 1.25 MG (50000 UT) CAPS capsule Take 1 capsule (50,000 Units total) by mouth every 7 (seven) days. 4 capsule 3  . VRAYLAR capsule TAKE 1 CAPSULE BY MOUTH EVERY DAY (Patient taking differently: Take 3 mg by mouth daily. ) 30 capsule 1  . Amphet-Dextroamphet 3-Bead ER (MYDAYIS) 50 MG CP24 Take 1 capsule by mouth every morning. 30 capsule 0   No current facility-administered medications for this visit.     Medication Side Effects: None  Tremor not gone  but less  Allergies: No Known Allergies  Past Medical History:  Diagnosis Date  . Allergy   . Anxiety   . Depression   . Hyperlipidemia   . Sleep apnea     Family History  Problem Relation Age of Onset  . Asthma Father   . Emphysema Father   . Heart disease Paternal Grandfather   . Heart disease Paternal Grandmother   . Breast cancer Maternal Grandmother   . Colon cancer Maternal Grandfather     Social History   Socioeconomic History  . Marital status: Married    Spouse name: Not on file  . Number of children: 1  . Years of education: Not on file  . Highest education level: Not on file  Occupational History  . Occupation: Doctor, hospital  . Financial resource strain: Not on file  . Food insecurity:    Worry: Not on file    Inability: Not on file  . Transportation needs:    Medical: Not on file    Non-medical: Not on file  Tobacco Use  . Smoking status: Former Smoker    Packs/day: 1.00    Years: 7.00    Pack years: 7.00    Types:  Cigarettes    Last attempt to quit: 07/11/1993    Years since quitting: 25.4  . Smokeless tobacco: Never Used  Substance and Sexual Activity  . Alcohol use: Yes    Comment: 1-2 drinks daily  . Drug use: No  . Sexual activity: Not on file  Lifestyle  . Physical activity:    Days per week: Not on file    Minutes per session: Not on file  . Stress: Not on file  Relationships  . Social connections:    Talks on phone: Not on file    Gets together: Not on file    Attends religious service: Not on file    Active member of club or organization: Not on file    Attends meetings of clubs or organizations: Not on file    Relationship status: Not on file  . Intimate partner violence:    Fear of current or ex partner: Not on file    Emotionally abused: Not on file    Physically abused: Not on file    Forced sexual activity: Not on file  Other Topics Concern  . Not on file  Social History Narrative  . Not on file    Past Medical History, Surgical history, Social history, and Family history were reviewed and updated as appropriate.   Son sam.  Please see review of systems for further details on the patient's review from today.   Objective:   Physical Exam:  There were no vitals taken for this visit.  Physical Exam Neurological:     Mental Status: He is alert and oriented to person, place, and time.     Cranial Nerves: No dysarthria.  Psychiatric:        Attention and Perception: He is inattentive. He does not perceive auditory hallucinations.        Mood and Affect: Mood is anxious and depressed. Affect is blunt.        Speech: Speech normal.        Behavior: Behavior is not agitated or aggressive. Behavior is cooperative.        Thought Content: Thought content normal. Thought content is not paranoid or delusional. Thought content does not include homicidal or suicidal ideation. Thought content does not include homicidal or  suicidal plan.        Cognition and Memory: Cognition  and memory normal.        Judgment: Judgment normal.     Comments: His compliance appears to have improved somewhat over time and he appears less labile but very flat according to both he and his wife. Unchanged since last appointment     Lab Review:     Component Value Date/Time   NA 141 08/26/2016 1959   K 4.0 08/26/2016 1959   CL 112 (H) 08/26/2016 1959   CO2 24 08/26/2016 1959   GLUCOSE 106 (H) 08/26/2016 1959   BUN 19 08/26/2016 1959   CREATININE 1.06 08/26/2016 1959   CALCIUM 8.8 (L) 08/26/2016 1959   PROT 6.7 08/26/2016 1959   ALBUMIN 3.8 08/26/2016 1959   AST 18 08/26/2016 1959   ALT 21 08/26/2016 1959   ALKPHOS 60 08/26/2016 1959   BILITOT 0.6 08/26/2016 1959   GFRNONAA >60 08/26/2016 1959   GFRAA >60 08/26/2016 1959       Component Value Date/Time   WBC 8.6 08/26/2016 1959   RBC 4.35 08/26/2016 1959   HGB 13.7 08/26/2016 1959   HCT 39.8 08/26/2016 1959   PLT 221 08/26/2016 1959   MCV 91.5 08/26/2016 1959   MCH 31.5 08/26/2016 1959   MCHC 34.4 08/26/2016 1959   RDW 12.6 08/26/2016 1959   LYMPHSABS 2.4 08/26/2016 1959   MONOABS 0.4 08/26/2016 1959   EOSABS 0.2 08/26/2016 1959   BASOSABS 0.0 08/26/2016 1959    Lithium Lvl  Date Value Ref Range Status  04/02/2018 0.7 0.6 - 1.2 mmol/L Final     Lab Results  Component Value Date   VALPROATE 54.6 04/02/2018    TSH at 3 .res Assessment: Plan:    Bipolar I disorder with depression ([place])  Attention deficit hyperactivity disorder (ADHD), combined type - Plan: Amphet-Dextroamphet 3-Bead ER (MYDAYIS) 50 MG CP24  Generalized anxiety disorder  Restless legs syndrome  Lithium use  Low vitamin D level  Obstructive sleep apnea   Greater than 50% of face to face time with patient was spent on counseling and coordination of care. 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  His symptoms have been  severe and very resistant to any benefit.    He cannot tolerate an increase in the Depakote based on prior ataxic experiences at 1500 mg daily.  Higher doses of lithium have not helped anymore.  He has failed multiple medications and ECT and [place]. Their primary concern today is the flatness.  He is not having extreme cycling.    He has not noticed any beneficial effect of the Cytomel other than his thyroid numbers per his primary care doctor are improved.  He did not notice any changes in mood and specifically flatness, nor mood swings with the reduction in lithium and Depakote.  Given that he sees some of his symptoms worsen late afternoon and his concentration goes away.  There is a concern that the [name] is wearing off early.  A switch to a longer acting agent such as Mydayis may improve his symptoms into the evening.  This is lower risk and faster acting then would other alternatives such as major changes in his mood stabilizing medications.  Discussed potential benefits, risks, and side effects of stimulants with patient to include increased heart rate, palpitations, insomnia, increased anxiety, increased irritability, or decreased appetite.  Instructed patient  to contact office if experiencing any significant tolerability issues.  Discussed the differences and Mydayis versus [name] in detail including duration and timing of dosages. DC [name] Start Mydayis 50 mg every morning  At the next visit we need to follow-up the status of his treatment of sleep apnea as this could be a major contributor to his difficulty.  Would like to minimize medication changes given the complexity of his case and the treatment resistant status.  Since he is not having more mood swings since the reduction in lithium to to 600 mg daily and the reduction in Depakote 500 mg daily we will not change those medications. They are aware that mood cycling could get worse and if it does let us know.Arman Filter at 3 mg daily  will remain unchanged.  We could consider a higher dose if he does not respond positively to [place]..  There is a question of whether is causing his occasional intermittent weird gagging symptom but he agrees to continue it at this dosage for now.  It appears that it may be helping the depression somewhat in reducing overall cycling. Discussed potential metabolic side effects associated with atypical antipsychotics, as well as potential risk for movement side effects. Advised pt to contact office if movement side effects occur.   Continue vitamin D check level later.  Continue B12.  Pramipexole prn for RLS   This appt was 30 mins.  Get labs from PCP.  Discussed potential change to a new therapist.  FU 1 month.  Lynder Parents, MD, DFAPA   Please see After Visit Summary for patient specific instructions.  No future appointments.  No orders of the defined types were placed in this encounter.     -------------------------------

## 2018-12-20 DIAGNOSIS — F331 Major depressive disorder, recurrent, moderate: Secondary | ICD-10-CM | POA: Diagnosis not present

## 2018-12-23 ENCOUNTER — Other Ambulatory Visit: Payer: Self-pay | Admitting: Psychiatry

## 2018-12-26 DIAGNOSIS — Z1159 Encounter for screening for other viral diseases: Secondary | ICD-10-CM | POA: Diagnosis not present

## 2018-12-28 DIAGNOSIS — I10 Essential (primary) hypertension: Secondary | ICD-10-CM | POA: Diagnosis not present

## 2018-12-28 DIAGNOSIS — E785 Hyperlipidemia, unspecified: Secondary | ICD-10-CM | POA: Diagnosis not present

## 2018-12-28 DIAGNOSIS — F988 Other specified behavioral and emotional disorders with onset usually occurring in childhood and adolescence: Secondary | ICD-10-CM | POA: Diagnosis not present

## 2018-12-28 DIAGNOSIS — K219 Gastro-esophageal reflux disease without esophagitis: Secondary | ICD-10-CM | POA: Diagnosis not present

## 2018-12-28 DIAGNOSIS — E039 Hypothyroidism, unspecified: Secondary | ICD-10-CM | POA: Diagnosis not present

## 2018-12-28 DIAGNOSIS — F329 Major depressive disorder, single episode, unspecified: Secondary | ICD-10-CM | POA: Diagnosis not present

## 2018-12-28 DIAGNOSIS — Z79899 Other long term (current) drug therapy: Secondary | ICD-10-CM | POA: Diagnosis not present

## 2018-12-28 DIAGNOSIS — Z7984 Long term (current) use of oral hypoglycemic drugs: Secondary | ICD-10-CM | POA: Diagnosis not present

## 2018-12-28 DIAGNOSIS — F909 Attention-deficit hyperactivity disorder, unspecified type: Secondary | ICD-10-CM | POA: Diagnosis not present

## 2018-12-28 DIAGNOSIS — N200 Calculus of kidney: Secondary | ICD-10-CM | POA: Diagnosis not present

## 2018-12-28 DIAGNOSIS — G4733 Obstructive sleep apnea (adult) (pediatric): Secondary | ICD-10-CM | POA: Diagnosis not present

## 2018-12-28 DIAGNOSIS — F411 Generalized anxiety disorder: Secondary | ICD-10-CM | POA: Diagnosis not present

## 2019-01-03 DIAGNOSIS — F331 Major depressive disorder, recurrent, moderate: Secondary | ICD-10-CM | POA: Diagnosis not present

## 2019-01-07 DIAGNOSIS — Z9989 Dependence on other enabling machines and devices: Secondary | ICD-10-CM | POA: Diagnosis not present

## 2019-01-07 DIAGNOSIS — G4730 Sleep apnea, unspecified: Secondary | ICD-10-CM | POA: Diagnosis not present

## 2019-01-07 DIAGNOSIS — Z87891 Personal history of nicotine dependence: Secondary | ICD-10-CM | POA: Diagnosis not present

## 2019-01-07 DIAGNOSIS — R0981 Nasal congestion: Secondary | ICD-10-CM | POA: Diagnosis not present

## 2019-01-11 ENCOUNTER — Other Ambulatory Visit: Payer: Self-pay | Admitting: Psychiatry

## 2019-01-15 ENCOUNTER — Other Ambulatory Visit: Payer: Self-pay | Admitting: Psychiatry

## 2019-01-15 DIAGNOSIS — F3163 Bipolar disorder, current episode mixed, severe, without psychotic features: Secondary | ICD-10-CM

## 2019-01-17 DIAGNOSIS — F331 Major depressive disorder, recurrent, moderate: Secondary | ICD-10-CM | POA: Diagnosis not present

## 2019-01-18 ENCOUNTER — Other Ambulatory Visit: Payer: Self-pay | Admitting: Psychiatry

## 2019-01-18 ENCOUNTER — Telehealth: Payer: Self-pay | Admitting: Psychiatry

## 2019-01-18 MED ORDER — LISDEXAMFETAMINE DIMESYLATE 70 MG PO CAPS: 70.0000 mg | ORAL_CAPSULE | Freq: Every day | ORAL | 0 refills | Status: DC

## 2019-01-18 NOTE — Telephone Encounter (Signed)
Patient would like to switch back from my days to [name] because he feels it is more effective.  that is okay.

## 2019-01-18 NOTE — Telephone Encounter (Signed)
Pt was put on new med mydayas and ran out and he took a vyvanse instead. Pt thinks the [name] is more effective and would like to switch back. Send to [facility] on [address].

## 2019-01-18 NOTE — Progress Notes (Signed)
Patient wanted to switch back from Mydayis to [name] 70 mg.  Prescription was sent in

## 2019-01-24 DIAGNOSIS — G4733 Obstructive sleep apnea (adult) (pediatric): Secondary | ICD-10-CM | POA: Diagnosis not present

## 2019-01-24 DIAGNOSIS — F331 Major depressive disorder, recurrent, moderate: Secondary | ICD-10-CM | POA: Diagnosis not present

## 2019-01-29 ENCOUNTER — Ambulatory Visit: Payer: Self-pay | Admitting: Allergy and Immunology

## 2019-01-30 DIAGNOSIS — R231 Pallor: Secondary | ICD-10-CM | POA: Diagnosis not present

## 2019-01-30 DIAGNOSIS — R42 Dizziness and giddiness: Secondary | ICD-10-CM | POA: Diagnosis not present

## 2019-01-30 DIAGNOSIS — F3175 Bipolar disorder, in partial remission, most recent episode depressed: Secondary | ICD-10-CM | POA: Diagnosis not present

## 2019-01-31 DIAGNOSIS — F331 Major depressive disorder, recurrent, moderate: Secondary | ICD-10-CM | POA: Diagnosis not present

## 2019-02-07 DIAGNOSIS — F331 Major depressive disorder, recurrent, moderate: Secondary | ICD-10-CM | POA: Diagnosis not present

## 2019-02-12 ENCOUNTER — Other Ambulatory Visit: Payer: Self-pay | Admitting: Psychiatry

## 2019-02-14 DIAGNOSIS — F331 Major depressive disorder, recurrent, moderate: Secondary | ICD-10-CM | POA: Diagnosis not present

## 2019-02-19 ENCOUNTER — Other Ambulatory Visit: Payer: Self-pay

## 2019-02-19 ENCOUNTER — Telehealth: Payer: Self-pay | Admitting: Psychiatry

## 2019-02-19 DIAGNOSIS — F902 Attention-deficit hyperactivity disorder, combined type: Secondary | ICD-10-CM

## 2019-02-19 MED ORDER — LISDEXAMFETAMINE DIMESYLATE 70 MG PO CAPS: 70.0000 mg | ORAL_CAPSULE | Freq: Every day | ORAL | 0 refills | Status: DC

## 2019-02-19 NOTE — Telephone Encounter (Signed)
Pended for approval.

## 2019-02-19 NOTE — Telephone Encounter (Signed)
Pt called need refill for [name] [facility] [address]

## 2019-02-21 DIAGNOSIS — F331 Major depressive disorder, recurrent, moderate: Secondary | ICD-10-CM | POA: Diagnosis not present

## 2019-02-24 DIAGNOSIS — G4733 Obstructive sleep apnea (adult) (pediatric): Secondary | ICD-10-CM | POA: Diagnosis not present

## 2019-02-25 ENCOUNTER — Encounter: Payer: Self-pay | Admitting: Gastroenterology

## 2019-02-26 DIAGNOSIS — K219 Gastro-esophageal reflux disease without esophagitis: Secondary | ICD-10-CM | POA: Diagnosis not present

## 2019-02-26 DIAGNOSIS — I10 Essential (primary) hypertension: Secondary | ICD-10-CM | POA: Diagnosis not present

## 2019-02-26 DIAGNOSIS — G4730 Sleep apnea, unspecified: Secondary | ICD-10-CM | POA: Diagnosis not present

## 2019-02-26 DIAGNOSIS — Z9989 Dependence on other enabling machines and devices: Secondary | ICD-10-CM | POA: Diagnosis not present

## 2019-02-28 DIAGNOSIS — F331 Major depressive disorder, recurrent, moderate: Secondary | ICD-10-CM | POA: Diagnosis not present

## 2019-03-07 ENCOUNTER — Ambulatory Visit (INDEPENDENT_AMBULATORY_CARE_PROVIDER_SITE_OTHER): Payer: BC Managed Care – PPO | Admitting: Psychiatry

## 2019-03-07 ENCOUNTER — Encounter: Payer: Self-pay | Admitting: Psychiatry

## 2019-03-07 ENCOUNTER — Other Ambulatory Visit: Payer: Self-pay

## 2019-03-07 VITALS — BP 127/73 | HR 98

## 2019-03-07 DIAGNOSIS — F902 Attention-deficit hyperactivity disorder, combined type: Secondary | ICD-10-CM

## 2019-03-07 DIAGNOSIS — F319 Bipolar disorder, unspecified: Secondary | ICD-10-CM

## 2019-03-07 DIAGNOSIS — F5105 Insomnia due to other mental disorder: Secondary | ICD-10-CM

## 2019-03-07 DIAGNOSIS — F331 Major depressive disorder, recurrent, moderate: Secondary | ICD-10-CM | POA: Diagnosis not present

## 2019-03-07 MED ORDER — LISDEXAMFETAMINE DIMESYLATE 70 MG PO CAPS: 70.0000 mg | ORAL_CAPSULE | Freq: Every day | ORAL | 0 refills | Status: DC

## 2019-03-07 MED ORDER — TRAZODONE HCL 50 MG PO TABS: ORAL_TABLET | ORAL | 1 refills | Status: DC

## 2019-03-07 MED ORDER — L-METHYLFOLATE 15 MG PO TABS: 15.0000 mg | ORAL_TABLET | Freq: Every day | ORAL | 11 refills | Status: DC

## 2019-03-07 NOTE — Patient Instructions (Addendum)
Reduce liothyronine to 1 1/2 tablets in the morning  Reduce Vraylar to 3 mg every other day to improve flatness   B-Complex 150  Disc the off-label use of N-Acetylcysteine at 1000-1200 mg daily to help with mild cognitive problems and depression.  It can be combined with a B-complex vitamin as the B-12 and folate have been shown to sometimes enhance the effect.  L-methylfolate 15 mg daily for depression

## 2019-03-07 NOTE — Progress Notes (Signed)
Zachary Dixon [id number] [birth year] 53 y.o.    Subjective:   Patient ID:  Zachary Dixon is a 53 y.o. (DOB December 18, 1965) male.  Chief Complaint:  Chief Complaint  Patient presents with  . Follow-up    Medication Management  . Depression    Medication Management  . Anxiety    Medication Management  . ADHD    Medication Management  . Medication Refill    Trazodone    Anxiety Symptoms include decreased concentration, nausea and nervous/anxious behavior. Patient reports no confusion or suicidal ideas.    Depression        Associated symptoms include decreased concentration.  Associated symptoms include no fatigue and no suicidal ideas.  Past medical history includes anxiety.   Medication Refill Associated symptoms include nausea. Pertinent negatives include no fatigue, sore throat or weakness.   Wife joined. 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    at visit October 11, 2018.  Cytomel potentiation was started for depression 25 mcg daily to increase to 50 mcg daily.  In addition there were concerns about flatness and he was wondering if it is related to side effects versus depression.  We reduced the lithium temporarily to see if that was the cause of the flatness. Reductions in lithium and depakote did not help the flatness.  No noticeable effect from the Cytomel except it did help the thyroid panel.  Last visit Dec 06, 2018.  [name] was switched to Mydayis to try to improve his function full before waking day.  Felt it was less effective than [name].  Didn't feel it kick in and no longer duration at maximum 50 mg so returned to [name]. It's better.   Today I feel good but is unusual.  Usually struggling a lot on the weekends depressed and sleeping late.  Pulls it together to work weekdays.  Wife agrees.  No longer the irritability and erratic mood swings he had in the past.   Very flat and unresponsive.  She wonders if the flatness is being caused to some degree by the mood stabilizers. If idle a lot of negative thoughts about current state of life but will drift off to sleep to escape.  Still feels up and down but not deep depression but flat still, maybe a little worse.  Pt reports that mood is Anxious, Depressed and so flat per wife and describes anxiety as Moderate. Anxiety symptoms include: Excessive Worry,. Pt reports sleeps excessively. Pt reports that appetite is good. Pt reports that energy is lethargic and poor and loss of interest or pleasure in usual activities, poor motivation and withdrawn from usual activities. Concentration varies.  Suicidal thoughts:  denied by patient  Work function ok.  .Reading more lately.  Uncertain about the current status of CPAP.  Compliant with meds.  Doing DBT.  But not following through with all of it.  Past psychiatric med medication trials include failure of ECT and TMS, Latuda 120 mg, Trintellix, aripiprazole, olanzapine 20 mg, Symbyax Seroquel, Rexulti, pramipexole, lamotrigine, duloxetine, Depakote 1500 ataxia, Pristiq, sertraline, bupropion 450 mg caused anxiety, Lexapro, Viibryd, [name], Concerta 72 mg, Deplin, lithium, Vraylar 3  Review of Systems:  Review of Systems  Constitutional: Negative for fatigue.  HENT: Negative for sore throat.   Gastrointestinal: Positive for nausea.       Gagging comes and goes  Neurological: Positive for tremors. Negative for weakness.  Psychiatric/Behavioral: Positive for  decreased concentration, depression and dysphoric mood. Negative for agitation, behavioral problems, confusion, hallucinations, self-injury, sleep disturbance and suicidal ideas. The patient is nervous/anxious. The patient is not hyperactive.     Medications: I have reviewed the patient's current medications.  Current Outpatient Medications  Medication Sig Dispense Refill  . divalproex (DEPAKOTE ER) 500 MG 24  hr tablet TAKE 2 TABLETS BY MOUTH AT BEDTIME (Patient taking differently: Take 500 mg by mouth daily. ) 60 tablet 5  . liothyronine (CYTOMEL) 25 MCG tablet TAKE 2 TABLETS BY MOUTH EACH MORNING 60 tablet 1  . lisdexamfetamine ([name]) 70 MG capsule Take 1 capsule (70 mg total) by mouth daily. 30 capsule 0  . lisdexamfetamine ([name]) 70 MG capsule Take 1 capsule (70 mg total) by mouth daily. 30 capsule 0  . lisdexamfetamine ([name]) 70 MG capsule Take 1 capsule (70 mg total) by mouth daily. 30 capsule 0  . lithium carbonate (LITHOBID) 300 MG CR tablet TAKE 3 TABLETS BY MOUTH EVERY DAY AT BEDTIME (Patient taking differently: Take 600 mg by mouth at bedtime. ) 90 tablet 2  . metFORMIN (GLUCOPHAGE) 500 MG tablet TAKE 2 TABLETS BY MOUTH TWICE A DAY 120 tablet 2  . Omega-3 1000 MG CAPS Take by mouth.    Marland Kitchen omeprazole (PRILOSEC) 40 MG capsule Take 40 mg by mouth daily.    . pramipexole (MIRAPEX) 1 MG tablet Take 2-3 mg by mouth daily as needed (restless legs).     . traZODone (DESYREL) 50 MG tablet TAKE 1-3 TABLETS BY MOUTH AT BEDTIME. 90 tablet 1  . vitamin B-12 (CYANOCOBALAMIN) 1000 MCG tablet Take by mouth.    . Vitamin D, Ergocalciferol, (DRISDOL) 1.25 MG (50000 UT) CAPS capsule TAKE 1 CAPSULE (50,000 UNITS TOTAL) BY MOUTH EVERY 7 (SEVEN) DAYS. 4 capsule 3  . VRAYLAR capsule TAKE 1 CAPSULE BY MOUTH EVERY DAY 30 capsule 1   No current facility-administered medications for this visit.     Medication Side Effects: None  Tremor not gone but less  Allergies: No Known Allergies  Past Medical History:  Diagnosis Date  . Allergy   . Anxiety   . Depression   . Hyperlipidemia   . Sleep apnea     Family History  Problem Relation Age of Onset  . Asthma Father   . Emphysema Father   . Heart disease Paternal Grandfather   . Heart disease Paternal Grandmother   . Breast cancer Maternal Grandmother   . Colon cancer Maternal Grandfather     Social History   Socioeconomic History  . Marital  status: Married    Spouse name: Not on file  . Number of children: 1  . Years of education: Not on file  . Highest education level: Not on file  Occupational History  . Occupation: Doctor, hospital  . Financial resource strain: Not on file  . Food insecurity    Worry: Not on file    Inability: Not on file  . Transportation needs    Medical: Not on file    Non-medical: Not on file  Tobacco Use  . Smoking status: Former Smoker    Packs/day: 1.00    Years: 7.00    Pack years: 7.00    Types: Cigarettes    Quit date: 07/11/1993    Years since quitting: 25.6  . Smokeless tobacco: Never Used  Substance and Sexual Activity  . Alcohol use: Yes    Comment: 1-2 drinks daily  . Drug use: No  . Sexual activity:  Not on file  Lifestyle  . Physical activity    Days per week: Not on file    Minutes per session: Not on file  . Stress: Not on file  Relationships  . Social Herbalist on phone: Not on file    Gets together: Not on file    Attends religious service: Not on file    Active member of club or organization: Not on file    Attends meetings of clubs or organizations: Not on file    Relationship status: Not on file  . Intimate partner violence    Fear of current or ex partner: Not on file    Emotionally abused: Not on file    Physically abused: Not on file    Forced sexual activity: Not on file  Other Topics Concern  . Not on file  Social History Narrative  . Not on file    Past Medical History, Surgical history, Social history, and Family history were reviewed and updated as appropriate.   Son sam.  Please see review of systems for further details on the patient's review from today.   Objective:   Physical Exam:  BP 127/73   Pulse 98   Physical Exam Neurological:     Mental Status: He is alert and oriented to person, place, and time.     Cranial Nerves: No dysarthria.  Psychiatric:        Attention and Perception: He is inattentive. He does not  perceive auditory hallucinations.        Mood and Affect: Mood is anxious and depressed. Affect is blunt.        Speech: Speech normal.        Behavior: Behavior is not agitated or aggressive. Behavior is cooperative.        Thought Content: Thought content normal. Thought content is not paranoid or delusional. Thought content does not include homicidal or suicidal ideation. Thought content does not include homicidal or suicidal plan.        Cognition and Memory: Cognition and memory normal.        Judgment: Judgment normal.     Comments: His compliance appears to have improved somewhat over time and he appears less labile but very flat according to both he and his wife. Unchanged since last appointment     Lab Review:     Component Value Date/Time   NA 141 08/26/2016 1959   K 4.0 08/26/2016 1959   CL 112 (H) 08/26/2016 1959   CO2 24 08/26/2016 1959   GLUCOSE 106 (H) 08/26/2016 1959   BUN 19 08/26/2016 1959   CREATININE 1.06 08/26/2016 1959   CALCIUM 8.8 (L) 08/26/2016 1959   PROT 6.7 08/26/2016 1959   ALBUMIN 3.8 08/26/2016 1959   AST 18 08/26/2016 1959   ALT 21 08/26/2016 1959   ALKPHOS 60 08/26/2016 1959   BILITOT 0.6 08/26/2016 1959   GFRNONAA >60 08/26/2016 1959   GFRAA >60 08/26/2016 1959       Component Value Date/Time   WBC 8.6 08/26/2016 1959   RBC 4.35 08/26/2016 1959   HGB 13.7 08/26/2016 1959   HCT 39.8 08/26/2016 1959   PLT 221 08/26/2016 1959   MCV 91.5 08/26/2016 1959   MCH 31.5 08/26/2016 1959   MCHC 34.4 08/26/2016 1959   RDW 12.6 08/26/2016 1959   LYMPHSABS 2.4 08/26/2016 1959   MONOABS 0.4 08/26/2016 1959   EOSABS 0.2 08/26/2016 1959   BASOSABS 0.0 08/26/2016 1959  Lithium Lvl  Date Value Ref Range Status  04/02/2018 0.7 0.6 - 1.2 mmol/L Final     Lab Results  Component Value Date   VALPROATE 54.6 04/02/2018    TSH at 3 .res Assessment: Plan:    No diagnosis found.   Greater than 50% of face to face time with patient was spent on  counseling and coordination of care. 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  His symptoms have been severe and very resistant to any benefit.    He cannot tolerate an increase in the Depakote based on prior ataxic experiences at 1500 mg daily.  Higher doses of lithium have not helped anymore.  He has failed multiple medications and ECT and [place]. Their primary concern today is the flatness.  He is not having extreme cycling like he did in the past.    He has not noticed any beneficial effect of the Cytomel but wife thinks maybe it is helped.  He is TSH however was very low at 0.01.  We will reduce the Cytomel from 50 mcg to 37-1/2 mcg every morning and then evaluate further at the next appointment.  He did not notice any changes in mood and specifically flatness, nor mood swings with the reduction in lithium and Depakote.  Unfortunately the [name] is more effective than the Mydayis so is going back to the [name].  It was hoped the Mydayis would extend benefit into the evening but he did not get a sufficiently potent effect.  Need to continue to evaluate the possibility that obstructive sleep apnea might be contributing to some of his symptoms.  Would like to minimize medication changes given the complexity of his case and the treatment resistant status.  Since he is not having more mood swings since the reduction in lithium to to 600 mg daily and the reduction in Depakote 500 mg daily we will not change those medications. They are aware that mood cycling could get worse and if it does let us know..  Reduce Vraylar to 1.5 mg daily to see if flatness will improve..  We discussed the possibility that he could start having more mood swings and if he does to let us know.  Discussed potential metabolic side effects associated with atypical antipsychotics, as well as potential risk for movement side  effects. Advised pt to contact office if movement side effects occur.   Continue vitamin D check level later.  Continue B12. Extensive discussions about possible supplements that could be helpful.  He will implement the following. B-Complex 150 Disc the off-label use of N-Acetylcysteine at 1000-1200 mg daily to help with mild cognitive problems and depression.  It can be combined with a B-complex vitamin as the B-12 and folate have been shown to sometimes enhance the effect. L-methylfolate 15 mg daily for depression.  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   Free T4 0.7 T3 158.9 TSH 0. 01 Reduce Cytomel to  1/1/2 of 25 mcg tablets. Extensive discussion of various thyroid lab values and use of thyroid meds in depression.    Pramipexole prn for RLS   This appt was 30 mins.  Get labs from PCP.  Discussed potential change to a new therapist.  FU 1 month.  Lynder Parents, MD, DFAPA   Please see After Visit Summary for patient specific instructions.  Future Appointments  Date Time Provider [place]  03/21/2019  8:30 AM [facility] PREVISIT [facility]  04/04/2019  8:30 AM Armbruster, Carlota Raspberry, MD [facility]    No orders of the defined types were placed in this encounter.     -------------------------------

## 2019-03-13 ENCOUNTER — Other Ambulatory Visit: Payer: Self-pay | Admitting: Psychiatry

## 2019-03-13 DIAGNOSIS — F3163 Bipolar disorder, current episode mixed, severe, without psychotic features: Secondary | ICD-10-CM

## 2019-03-14 DIAGNOSIS — F331 Major depressive disorder, recurrent, moderate: Secondary | ICD-10-CM | POA: Diagnosis not present

## 2019-03-21 ENCOUNTER — Other Ambulatory Visit: Payer: Self-pay

## 2019-03-21 ENCOUNTER — Ambulatory Visit (AMBULATORY_SURGERY_CENTER): Payer: Self-pay | Admitting: *Deleted

## 2019-03-21 VITALS — Temp 97.7°F | Ht 74.0 in | Wt 208.8 lb

## 2019-03-21 DIAGNOSIS — F331 Major depressive disorder, recurrent, moderate: Secondary | ICD-10-CM | POA: Diagnosis not present

## 2019-03-21 DIAGNOSIS — Z1211 Encounter for screening for malignant neoplasm of colon: Secondary | ICD-10-CM

## 2019-03-21 MED ORDER — SUPREP BOWEL PREP KIT 17.5-3.13-1.6 GM/177ML PO SOLN: 1.0000 | Freq: Once | ORAL | 0 refills | Status: AC

## 2019-03-21 NOTE — Progress Notes (Signed)
Patient denies any allergies to egg or soy products. Patient denies complications with anesthesia/sedation.  Patient denies oxygen use at home and denies diet medications. Emmi instructions for colonoscopy explained and given to patient.  Suprep coupon given.

## 2019-03-22 ENCOUNTER — Encounter: Payer: Self-pay | Admitting: Gastroenterology

## 2019-03-27 DIAGNOSIS — G4733 Obstructive sleep apnea (adult) (pediatric): Secondary | ICD-10-CM | POA: Diagnosis not present

## 2019-03-28 DIAGNOSIS — F331 Major depressive disorder, recurrent, moderate: Secondary | ICD-10-CM | POA: Diagnosis not present

## 2019-04-03 ENCOUNTER — Telehealth: Payer: Self-pay

## 2019-04-03 NOTE — Telephone Encounter (Signed)
Covid-19 screening questions   Do you now or have you had a fever in the last 14 days?  Do you have any respiratory symptoms of shortness of breath or cough now or in the last 14 days?  Do you have any family members or close contacts with diagnosed or suspected Covid-19 in the past 14 days?  Have you been tested for Covid-19 and found to be positive?       

## 2019-04-03 NOTE — Telephone Encounter (Signed)
Pt returned call and answered “No” to all questions.  °  °Pt made aware of that care partner may come to the lobby during the procedure but will need to provide their own mask. ° ° °

## 2019-04-04 ENCOUNTER — Other Ambulatory Visit: Payer: Self-pay

## 2019-04-04 ENCOUNTER — Ambulatory Visit (AMBULATORY_SURGERY_CENTER): Payer: BC Managed Care – PPO | Admitting: Gastroenterology

## 2019-04-04 ENCOUNTER — Encounter: Payer: Self-pay | Admitting: Gastroenterology

## 2019-04-04 VITALS — BP 126/74 | HR 71 | Temp 97.8°F | Resp 21 | Ht 74.0 in | Wt 208.0 lb

## 2019-04-04 DIAGNOSIS — D122 Benign neoplasm of ascending colon: Secondary | ICD-10-CM

## 2019-04-04 DIAGNOSIS — Z1211 Encounter for screening for malignant neoplasm of colon: Secondary | ICD-10-CM | POA: Diagnosis not present

## 2019-04-04 DIAGNOSIS — D125 Benign neoplasm of sigmoid colon: Secondary | ICD-10-CM

## 2019-04-04 DIAGNOSIS — D123 Benign neoplasm of transverse colon: Secondary | ICD-10-CM

## 2019-04-04 DIAGNOSIS — F331 Major depressive disorder, recurrent, moderate: Secondary | ICD-10-CM | POA: Diagnosis not present

## 2019-04-04 NOTE — Progress Notes (Signed)
Called to room to assist during endoscopic procedure.  Patient ID and intended procedure confirmed with present staff. Received instructions for my participation in the procedure from the performing physician.  

## 2019-04-04 NOTE — Progress Notes (Signed)
Report given to PACU, vss 

## 2019-04-04 NOTE — Progress Notes (Signed)
Temp check by JB/vital check by [place].  Patient states no changes in medical or surgical history since pre-visit screening on 03/21/19.

## 2019-04-04 NOTE — Op Note (Signed)
[facility] Patient Name: Zachary Dixon Procedure Date: 04/04/2019 8:17 AM MRN: [id number] Endoscopist: Remo Lipps P. Havery [name] , MD Age: 53 Referring MD:  Date of Birth: Feb 18, 1966 Gender: Male Account #: [id number] Procedure:                Colonoscopy Indications:              Screening for colorectal malignant neoplasm, This                            is the patient's first colonoscopy Medicines:                Monitored Anesthesia Care Procedure:                Pre-Anesthesia Assessment:                           - Prior to the procedure, a History and Physical                            was performed, and patient medications and                            allergies were reviewed. The patient's tolerance of                            previous anesthesia was also reviewed. The risks                            and benefits of the procedure and the sedation                            options and risks were discussed with the patient.                            All questions were answered, and informed consent                            was obtained. Prior Anticoagulants: The patient has                            taken no previous anticoagulant or antiplatelet                            agents. ASA Grade Assessment: II - A patient with                            mild systemic disease. After reviewing the risks                            and benefits, the patient was deemed in                            satisfactory condition to undergo the procedure.  After obtaining informed consent, the colonoscope                            was passed under direct vision. Throughout the                            procedure, the patient's blood pressure, pulse, and                            oxygen saturations were monitored continuously. The                            Colonoscope was introduced through the anus and                            advanced to the the  cecum, identified by                            appendiceal orifice and ileocecal valve. The                            colonoscopy was performed without difficulty. The                            patient tolerated the procedure well. The quality                            of the bowel preparation was good. The ileocecal                            valve, appendiceal orifice, and rectum were                            photographed. Scope In: 8:28:38 AM Scope Out: 8:53:32 AM Scope Withdrawal Time: 0 hours 20 minutes 25 seconds  Total Procedure Duration: 0 hours 24 minutes 54 seconds  Findings:                 The perianal and digital rectal examinations were                            normal.                           A 4 mm polyp was found in the ascending colon. The                            polyp was flat. The polyp was removed with a cold                            snare. Resection and retrieval were complete.                           Four flat and sessile polyps were found in the  transverse colon. The polyps were 3 to 4 mm in                            size. These polyps were removed with a cold snare.                            Resection and retrieval were complete.                           A 4 mm polyp was found in the sigmoid colon. The                            polyp was sessile. The polyp was removed with a                            cold snare. Resection and retrieval were complete.                           Internal hemorrhoids were found during                            retroflexion. The hemorrhoids were small.                           The colon was tortuous.                           The exam was otherwise without abnormality. Complications:            No immediate complications. Estimated blood loss:                            Minimal. Estimated Blood Loss:     Estimated blood loss was minimal. Impression:               - One 4 mm polyp in  the ascending colon, removed                            with a cold snare. Resected and retrieved.                           - Four 3 to 4 mm polyps in the transverse colon,                            removed with a cold snare. Resected and retrieved.                           - One 4 mm polyp in the sigmoid colon, removed with                            a cold snare. Resected and retrieved.                           - Internal hemorrhoids.                           -  Tortuous colon.                           - The examination was otherwise normal. Recommendation:           - Patient has a contact number available for                            emergencies. The signs and symptoms of potential                            delayed complications were discussed with the                            patient. Return to normal activities tomorrow.                            Written discharge instructions were provided to the                            patient.                           - Resume previous diet.                           - Continue present medications.                           - Await pathology results. Remo Lipps P. Bergen Melle, MD 04/04/2019 8:59:16 AM This report has been signed electronically.

## 2019-04-04 NOTE — Patient Instructions (Signed)
Read all of the handouts given to you by your recovery room nurse.  Your recall will be determined by your pathology results.  Thank-you for choosing [place] for your healthcare needs today.  YOU HAD AN ENDOSCOPIC PROCEDURE TODAY AT [facility]:   Refer to the procedure report that was given to you for any specific questions about what was found during the examination.  If the procedure report does not answer your questions, please call your gastroenterologist to clarify.  If you requested that your care partner not be given the details of your procedure findings, then the procedure report has been included in a sealed envelope for you to review at your convenience later.  YOU SHOULD EXPECT: Some feelings of bloating in the abdomen. Passage of more gas than usual.  Walking can help get rid of the air that was put into your GI tract during the procedure and reduce the bloating. If you had a lower endoscopy (such as a colonoscopy or flexible sigmoidoscopy) you may notice spotting of blood in your stool or on the toilet paper. If you underwent a bowel prep for your procedure, you may not have a normal bowel movement for a few days.  Please Note:  You might notice some irritation and congestion in your nose or some drainage.  This is from the oxygen used during your procedure.  There is no need for concern and it should clear up in a day or so.  SYMPTOMS TO REPORT IMMEDIATELY:   Following lower endoscopy (colonoscopy or flexible sigmoidoscopy):  Excessive amounts of blood in the stool  Significant tenderness or worsening of abdominal pains  Swelling of the abdomen that is new, acute  Fever of 100F or higher   For urgent or emergent issues, a gastroenterologist can be reached at any hour by calling [phone].   DIET:  We do recommend a small meal at first, but then you may proceed to your regular diet.  Drink plenty of fluids but you should avoid alcoholic beverages for 24  hours.  ACTIVITY:  You should plan to take it easy for the rest of today and you should NOT DRIVE or use heavy machinery until tomorrow (because of the sedation medicines used during the test).    FOLLOW UP: Our staff will call the number listed on your records 48-72 hours following your procedure to check on you and address any questions or concerns that you may have regarding the information given to you following your procedure. If we do not reach you, we will leave a message.  We will attempt to reach you two times.  During this call, we will ask if you have developed any symptoms of COVID 19. If you develop any symptoms (ie: fever, flu-like symptoms, shortness of breath, cough etc.) before then, please call [phone].  If you test positive for Covid 19 in the 2 weeks post procedure, please call and report this information to [place].    If any biopsies were taken you will be contacted by phone or by letter within the next 1-3 weeks.  Please call us at [phone] if you have not heard about the biopsies in 3 weeks.    SIGNATURES/CONFIDENTIALITY: You and/or your care partner have signed paperwork which will be entered into your electronic medical record.  These signatures attest to the fact that that the information above on your After Visit Summary has been reviewed and is understood.  Full responsibility of the confidentiality of this discharge  information lies with you and/or your care-partner. 

## 2019-04-08 ENCOUNTER — Telehealth: Payer: Self-pay

## 2019-04-08 NOTE — Telephone Encounter (Signed)
Attempted to reach pt. With follow-up call following endoscopic procedure 04/04/2019.  LM on pt. Voice mail.  Will try to reach pt. Again later today.

## 2019-04-10 ENCOUNTER — Encounter: Payer: Self-pay | Admitting: Gastroenterology

## 2019-04-18 ENCOUNTER — Ambulatory Visit (INDEPENDENT_AMBULATORY_CARE_PROVIDER_SITE_OTHER): Payer: BC Managed Care – PPO | Admitting: Psychiatry

## 2019-04-18 ENCOUNTER — Encounter: Payer: Self-pay | Admitting: Psychiatry

## 2019-04-18 ENCOUNTER — Other Ambulatory Visit: Payer: Self-pay

## 2019-04-18 DIAGNOSIS — F5105 Insomnia due to other mental disorder: Secondary | ICD-10-CM

## 2019-04-18 DIAGNOSIS — R7989 Other specified abnormal findings of blood chemistry: Secondary | ICD-10-CM

## 2019-04-18 DIAGNOSIS — F411 Generalized anxiety disorder: Secondary | ICD-10-CM

## 2019-04-18 DIAGNOSIS — G2581 Restless legs syndrome: Secondary | ICD-10-CM

## 2019-04-18 DIAGNOSIS — F319 Bipolar disorder, unspecified: Secondary | ICD-10-CM | POA: Diagnosis not present

## 2019-04-18 DIAGNOSIS — G4733 Obstructive sleep apnea (adult) (pediatric): Secondary | ICD-10-CM

## 2019-04-18 DIAGNOSIS — F902 Attention-deficit hyperactivity disorder, combined type: Secondary | ICD-10-CM

## 2019-04-18 DIAGNOSIS — F331 Major depressive disorder, recurrent, moderate: Secondary | ICD-10-CM | POA: Diagnosis not present

## 2019-04-18 DIAGNOSIS — Z79899 Other long term (current) drug therapy: Secondary | ICD-10-CM

## 2019-04-18 MED ORDER — LISDEXAMFETAMINE DIMESYLATE 70 MG PO CAPS: 70.0000 mg | ORAL_CAPSULE | Freq: Every day | ORAL | 0 refills | Status: DC

## 2019-04-18 NOTE — Progress Notes (Signed)
Zachary Dixon [id number] 08/12/1965 53 y.o.    Subjective:   Patient ID:  Zachary Dixon is a 53 y.o. (DOB [birth year]) male.  Chief Complaint:  Chief Complaint  Patient presents with  . Follow-up    med changes  . Depression  . Anxiety  . ADHD    Depression        Associated symptoms include no decreased concentration, no fatigue and no suicidal ideas.  Past medical history includes anxiety.   Anxiety Symptoms include nervous/anxious behavior. Patient reports no confusion, decreased concentration, nausea or suicidal ideas.    Medication Refill Pertinent negatives include no fatigue, nausea, sore throat or weakness.   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    at visit October 11, 2018.  Cytomel potentiation was started for depression 25 mcg daily to increase to 50 mcg daily.  In addition there were concerns about flatness and he was wondering if it is related to side effects versus depression.  We reduced the lithium temporarily to see if that was the cause of the flatness. Reductions in lithium and depakote did not help the flatness.  No noticeable effect from the Cytomel except it did help the thyroid panel.  At visit Dec 06, 2018.  [name] was switched to Mydayis to try to improve his function full before waking day. Felt it was less effective than [name].  Didn't feel it kick in and no longer duration at maximum 50 mg so returned to [name]. It's better.   Patient was last seen August 2020.  Because his TSH was suppressed we reduced Cytomel to 37.5 mcg daily.  Because of flatness we reduced the Vraylar to 1.5 mg daily to see if there could be improvement.  There's been improvement.  Wife agrees.  A lot less depression in fact hasn't felt it lately.  Anxiety related to enormous work project.  Heaviness lifted.  Reduced flatness a lot.    Out of bed more.  Not really a  problem any more. No worsening mood swings. Always have a problem with seasonal changes and yet has handled this well this time.    Uncertain about the current status of CPAP.  Compliant with meds.  Doing DBT.  But not following through with all of it.  Past psychiatric med medication trials include failure of ECT and TMS,  Latuda 120 mg,  aripiprazole, olanzapine 20 mg, Symbyax Seroquel, Rexulti, pramipexole, lamotrigine, , Depakote 1500 ataxia,  lithium, Vraylar 3 Pristiq, sertraline, bupropion 450 mg caused anxiety, Lexapro, Viibryd,  Deplin, Duloxetine, Trintellix, [name], Mydayis Concerta 72 mg,  Review of Systems:  Review of Systems  Constitutional: Negative for fatigue.  HENT: Negative for sore throat.   Gastrointestinal: Negative for nausea.       Gagging comes and goes  Neurological: Negative for tremors and weakness.  Psychiatric/Behavioral: Positive for depression. Negative for agitation, behavioral problems, confusion, decreased concentration, dysphoric mood, hallucinations, self-injury, sleep disturbance and suicidal ideas. The patient is nervous/anxious. The patient is not hyperactive.     Medications: I have reviewed the patient's current medications.  Current Outpatient Medications  Medication Sig Dispense Refill  . cariprazine (VRAYLAR) capsule Take 1 capsule (3 mg total) by mouth daily. (Patient taking differently: Take 1.5 mg by mouth daily. ) 30 capsule 2  . divalproex (DEPAKOTE ER) 500 MG 24 hr tablet TAKE 2 TABLETS BY MOUTH AT BEDTIME (Patient taking differently: Take 500  mg by mouth daily. ) 60 tablet 5  . L-Methylfolate 15 MG TABS Take 1 tablet (15 mg total) by mouth daily. 30 tablet 11  . liothyronine (CYTOMEL) 25 MCG tablet TAKE 2 TABLETS BY MOUTH EACH MORNING (Patient taking differently: Take 37.5 mcg by mouth daily. ) 60 tablet 1  . [START ON 05/02/2019] lisdexamfetamine ([name]) 70 MG capsule Take 1 capsule (70 mg total) by mouth daily. 30 capsule 0   . lisdexamfetamine ([name]) 70 MG capsule Take 1 capsule (70 mg total) by mouth daily. 30 capsule 0  . [START ON 05/16/2019] lisdexamfetamine ([name]) 70 MG capsule Take 1 capsule (70 mg total) by mouth daily. 30 capsule 0  . lithium carbonate (LITHOBID) 300 MG CR tablet TAKE 3 TABLETS BY MOUTH EVERY DAY AT BEDTIME (Patient taking differently: Take 600 mg by mouth at bedtime. ) 90 tablet 2  . metFORMIN (GLUCOPHAGE) 500 MG tablet TAKE 2 TABLETS BY MOUTH TWICE A DAY 120 tablet 2  . Omega-3 1000 MG CAPS Take by mouth.    Marland Kitchen omeprazole (PRILOSEC) 40 MG capsule Take 40 mg by mouth daily.    . traZODone (DESYREL) 50 MG tablet TAKE 1-3 TABLETS BY MOUTH AT BEDTIME. 90 tablet 1  . vitamin B-12 (CYANOCOBALAMIN) 1000 MCG tablet Take by mouth.    . Vitamin D, Ergocalciferol, (DRISDOL) 1.25 MG (50000 UT) CAPS capsule TAKE 1 CAPSULE (50,000 UNITS TOTAL) BY MOUTH EVERY 7 (SEVEN) DAYS. 4 capsule 3   No current facility-administered medications for this visit.     Medication Side Effects: None  Tremor not gone but less  Allergies: No Known Allergies  Past Medical History:  Diagnosis Date  . ADHD   . Allergy   . Anxiety   . Bipolar 2 disorder ([place])   . Depression   . Diabetes mellitus without complication ([place])   . GERD (gastroesophageal reflux disease)   . Hyperlipidemia    diet controlled  . Sleep apnea    uses CPAP nightly  . Thyroid disease     Family History  Problem Relation Age of Onset  . Asthma Father   . Emphysema Father   . Heart disease Paternal Grandfather   . Heart disease Paternal Grandmother   . Breast cancer Maternal Grandmother   . Colon cancer Maternal Grandfather   . Rectal cancer Neg Hx   . Stomach cancer Neg Hx   . Esophageal cancer Neg Hx     Social History   Socioeconomic History  . Marital status: Married    Spouse name: Not on file  . Number of children: 1  . Years of education: Not on file  . Highest education level: Not on file  Occupational History   . Occupation: Doctor, hospital  . Financial resource strain: Not on file  . Food insecurity    Worry: Not on file    Inability: Not on file  . Transportation needs    Medical: Not on file    Non-medical: Not on file  Tobacco Use  . Smoking status: Former Smoker    Packs/day: 1.00    Years: 7.00    Pack years: 7.00    Types: Cigarettes    Quit date: 07/11/1993    Years since quitting: 25.7  . Smokeless tobacco: Never Used  Substance and Sexual Activity  . Alcohol use: Yes    Alcohol/week: 4.0 standard drinks    Types: 4 Standard drinks or equivalent per week  . Drug use: No  . Sexual  activity: Not on file  Lifestyle  . Physical activity    Days per week: Not on file    Minutes per session: Not on file  . Stress: Not on file  Relationships  . Social Herbalist on phone: Not on file    Gets together: Not on file    Attends religious service: Not on file    Active member of club or organization: Not on file    Attends meetings of clubs or organizations: Not on file    Relationship status: Not on file  . Intimate partner violence    Fear of current or ex partner: Not on file    Emotionally abused: Not on file    Physically abused: Not on file    Forced sexual activity: Not on file  Other Topics Concern  . Not on file  Social History Narrative  . Not on file    Past Medical History, Surgical history, Social history, and Family history were reviewed and updated as appropriate.   Son sam.  Please see review of systems for further details on the patient's review from today.   Objective:   Physical Exam:  There were no vitals taken for this visit.  Physical Exam Constitutional:      General: He is not in acute distress.    Appearance: He is well-developed.  Musculoskeletal:        General: No deformity.  Neurological:     Mental Status: He is alert and oriented to person, place, and time.     Cranial Nerves: No dysarthria.     Coordination:  Coordination normal.  Psychiatric:        Attention and Perception: Perception normal. He is attentive. He does not perceive auditory or visual hallucinations.        Mood and Affect: Mood is anxious. Mood is not depressed. Affect is not labile, blunt, angry or inappropriate.        Speech: Speech normal.        Behavior: Behavior normal. Behavior is not agitated or aggressive. Behavior is cooperative.        Thought Content: Thought content normal. Thought content is not paranoid or delusional. Thought content does not include homicidal or suicidal ideation. Thought content does not include homicidal or suicidal plan.        Cognition and Memory: Cognition and memory normal.        Judgment: Judgment normal.     Comments: His mood lability is still under control.  He is much less depressed than the last visit.  He still has a little bit of flatness in his affect.  But it is better than before.     Lab Review:     Component Value Date/Time   NA 141 08/26/2016 1959   K 4.0 08/26/2016 1959   CL 112 (H) 08/26/2016 1959   CO2 24 08/26/2016 1959   GLUCOSE 106 (H) 08/26/2016 1959   BUN 19 08/26/2016 1959   CREATININE 1.06 08/26/2016 1959   CALCIUM 8.8 (L) 08/26/2016 1959   PROT 6.7 08/26/2016 1959   ALBUMIN 3.8 08/26/2016 1959   AST 18 08/26/2016 1959   ALT 21 08/26/2016 1959   ALKPHOS 60 08/26/2016 1959   BILITOT 0.6 08/26/2016 1959   GFRNONAA >60 08/26/2016 1959   GFRAA >60 08/26/2016 1959       Component Value Date/Time   WBC 8.6 08/26/2016 1959   RBC 4.35 08/26/2016 1959   HGB 13.7 08/26/2016  1959   HCT 39.8 08/26/2016 1959   PLT 221 08/26/2016 1959   MCV 91.5 08/26/2016 1959   MCH 31.5 08/26/2016 1959   MCHC 34.4 08/26/2016 1959   RDW 12.6 08/26/2016 1959   LYMPHSABS 2.4 08/26/2016 1959   MONOABS 0.4 08/26/2016 1959   EOSABS 0.2 08/26/2016 1959   BASOSABS 0.0 08/26/2016 1959    Lithium Lvl  Date Value Ref Range Status  04/02/2018 0.7 0.6 - 1.2 mmol/L Final      Lab Results  Component Value Date   VALPROATE 54.6 04/02/2018    TSH at 3 .res Assessment: Plan:    Bipolar I disorder with depression ([place])  Attention deficit hyperactivity disorder (ADHD), combined type - Plan: lisdexamfetamine ([name]) 70 MG capsule, lisdexamfetamine ([name]) 70 MG capsule  Insomnia due to mental condition  Generalized anxiety disorder  Restless legs syndrome  Lithium use  Low vitamin D level  Obstructive sleep apnea   Greater than 50% of face to face time with patient was spent on counseling and coordination of care. 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  His symptoms have been severe and very resistant to any benefit.    He cannot tolerate an increase in the Depakote based on prior ataxic experiences at 1500 mg daily.  Higher doses of lithium have not helped anymore.  He has failed multiple medications and ECT and [place]. Their primary concern at the last visit was flatness.  He was not having extreme cycling like he did in the past.  We previously had reduced lithium to 600 mg daily and reduce Depakote to 500 mg daily so those were not changed.  We therefore decided to reduce to Vraylar to 1.5 mg daily.  He is actually taking 3 mg every other day.  That has led to significant improvement in the level of flatness and reduction in depression.  He is the best he has been in quite some time.  He still has some significant anxiety due to the heavy workload and demands but he is handling it.  Still need to be aware of the possibility that obstructive sleep apnea might be contributing to some of his symptoms.  Continue vitamin D check level later.  Continue B12. Extensive discussions about possible supplements that could be helpful.  He will continue the following. B-Complex 150  N-Acetylcysteine at 1000-1200 mg daily to help with mild cognitive  problems and depression.  It can be combined with a B-complex vitamin as the B-12 and folate have been shown to sometimes enhance the effect. L-methylfolate 15 mg daily for depression.  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   Free T4 0.7 T3 158.9 TSH 0. 01 Therefore at the last visit we reduced Cytomel to 37.5 mcg every morning.  Cytomel is being used as a depression augmentation strategy.  He had no problems with this and will consider further reduction.  He will see Dr. Concha Pyo soon who is likely to order follow-up lab tests Extensive discussion of various thyroid lab values and use of thyroid meds in depression.    Pramipexole prn for RLS this is managed at this time.  Discussed his polypharmacy which is not ideal but has been necessary to control the mood cycling and now his depression has improved.  Plus he also has ADD that has to be managed.  He is satisfied with his ADD management  with [name].  Discussed potential change to a new therapist.  No med changes indicated today.  FU 2 months.  He is aware of the potential for seasonal depression to affect him as we progressed into the fall and winter.  Lynder Parents, MD, DFAPA   Please see After Visit Summary for patient specific instructions.  Future Appointments  Date Time Provider [place]  06/18/2019  9:30 AM Cottle, Billey Co., MD [facility] None    No orders of the defined types were placed in this encounter.     -------------------------------

## 2019-04-25 DIAGNOSIS — F331 Major depressive disorder, recurrent, moderate: Secondary | ICD-10-CM | POA: Diagnosis not present

## 2019-04-26 DIAGNOSIS — G4733 Obstructive sleep apnea (adult) (pediatric): Secondary | ICD-10-CM | POA: Diagnosis not present

## 2019-05-02 DIAGNOSIS — F331 Major depressive disorder, recurrent, moderate: Secondary | ICD-10-CM | POA: Diagnosis not present

## 2019-05-09 DIAGNOSIS — F331 Major depressive disorder, recurrent, moderate: Secondary | ICD-10-CM | POA: Diagnosis not present

## 2019-05-10 DIAGNOSIS — F319 Bipolar disorder, unspecified: Secondary | ICD-10-CM | POA: Diagnosis not present

## 2019-05-10 DIAGNOSIS — E039 Hypothyroidism, unspecified: Secondary | ICD-10-CM | POA: Diagnosis not present

## 2019-05-10 DIAGNOSIS — Z125 Encounter for screening for malignant neoplasm of prostate: Secondary | ICD-10-CM | POA: Diagnosis not present

## 2019-05-10 DIAGNOSIS — E559 Vitamin D deficiency, unspecified: Secondary | ICD-10-CM | POA: Diagnosis not present

## 2019-05-10 DIAGNOSIS — Z0000 Encounter for general adult medical examination without abnormal findings: Secondary | ICD-10-CM | POA: Diagnosis not present

## 2019-05-16 DIAGNOSIS — F331 Major depressive disorder, recurrent, moderate: Secondary | ICD-10-CM | POA: Diagnosis not present

## 2019-05-18 ENCOUNTER — Other Ambulatory Visit: Payer: Self-pay | Admitting: Psychiatry

## 2019-05-23 DIAGNOSIS — F900 Attention-deficit hyperactivity disorder, predominantly inattentive type: Secondary | ICD-10-CM | POA: Diagnosis not present

## 2019-05-23 DIAGNOSIS — F341 Dysthymic disorder: Secondary | ICD-10-CM | POA: Diagnosis not present

## 2019-05-27 DIAGNOSIS — G4733 Obstructive sleep apnea (adult) (pediatric): Secondary | ICD-10-CM | POA: Diagnosis not present

## 2019-05-30 DIAGNOSIS — F341 Dysthymic disorder: Secondary | ICD-10-CM | POA: Diagnosis not present

## 2019-05-30 DIAGNOSIS — F900 Attention-deficit hyperactivity disorder, predominantly inattentive type: Secondary | ICD-10-CM | POA: Diagnosis not present

## 2019-06-04 DIAGNOSIS — G4733 Obstructive sleep apnea (adult) (pediatric): Secondary | ICD-10-CM | POA: Diagnosis not present

## 2019-06-08 DIAGNOSIS — Z20828 Contact with and (suspected) exposure to other viral communicable diseases: Secondary | ICD-10-CM | POA: Diagnosis not present

## 2019-06-09 ENCOUNTER — Other Ambulatory Visit: Payer: Self-pay | Admitting: Psychiatry

## 2019-06-13 DIAGNOSIS — F900 Attention-deficit hyperactivity disorder, predominantly inattentive type: Secondary | ICD-10-CM | POA: Diagnosis not present

## 2019-06-13 DIAGNOSIS — F341 Dysthymic disorder: Secondary | ICD-10-CM | POA: Diagnosis not present

## 2019-06-18 ENCOUNTER — Encounter: Payer: Self-pay | Admitting: Psychiatry

## 2019-06-18 ENCOUNTER — Ambulatory Visit (INDEPENDENT_AMBULATORY_CARE_PROVIDER_SITE_OTHER): Payer: BC Managed Care – PPO | Admitting: Psychiatry

## 2019-06-18 ENCOUNTER — Other Ambulatory Visit: Payer: Self-pay

## 2019-06-18 DIAGNOSIS — F5105 Insomnia due to other mental disorder: Secondary | ICD-10-CM | POA: Diagnosis not present

## 2019-06-18 DIAGNOSIS — R7989 Other specified abnormal findings of blood chemistry: Secondary | ICD-10-CM

## 2019-06-18 DIAGNOSIS — F902 Attention-deficit hyperactivity disorder, combined type: Secondary | ICD-10-CM | POA: Diagnosis not present

## 2019-06-18 DIAGNOSIS — F319 Bipolar disorder, unspecified: Secondary | ICD-10-CM | POA: Diagnosis not present

## 2019-06-18 DIAGNOSIS — F411 Generalized anxiety disorder: Secondary | ICD-10-CM | POA: Diagnosis not present

## 2019-06-18 DIAGNOSIS — Z79899 Other long term (current) drug therapy: Secondary | ICD-10-CM

## 2019-06-18 DIAGNOSIS — G4733 Obstructive sleep apnea (adult) (pediatric): Secondary | ICD-10-CM

## 2019-06-18 DIAGNOSIS — G2581 Restless legs syndrome: Secondary | ICD-10-CM

## 2019-06-18 NOTE — Progress Notes (Signed)
Zachary Dixon [id number] [birth year] 53 y.o.  Virtual Visit via Telephone Note  I connected with pt by telephone and verified that I am speaking with the correct person using two identifiers.   I discussed the limitations, risks, security and privacy concerns of performing an evaluation and management service by telephone and the availability of in person appointments. I also discussed with the patient that there may be a patient responsible charge related to this service. The patient expressed understanding and agreed to proceed.  I discussed the assessment and treatment plan with the patient. The patient was provided an opportunity to ask questions and all were answered. The patient agreed with the plan and demonstrated an understanding of the instructions.   The patient was advised to call back or seek an in-person evaluation if the symptoms worsen or if the condition fails to improve as anticipated.  I provided 25 minutes of non-face-to-face time during this encounter. The call started at 920 and ended at 945. The patient was located at home and the provider was located office.   Subjective:   Patient ID:  Zachary Dixon is a 53 y.o. (DOB 1966/03/12) male.  Chief Complaint:  Chief Complaint  Patient presents with  . Depression    pscyh management  . Anxiety    Depression        Associated symptoms include fatigue.  Associated symptoms include no decreased concentration and no suicidal ideas.  Past medical history includes anxiety.   Anxiety Symptoms include nervous/anxious behavior. Patient reports no confusion, decreased concentration, nausea or suicidal ideas.    Medication Refill Associated symptoms include fatigue. Pertinent negatives include no nausea, sore throat or weakness.   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    at visit October 11, 2018.  Cytomel  potentiation was started for depression 25 mcg daily to increase to 50 mcg daily.  In addition there were concerns about flatness and he was wondering if it is related to side effects versus depression.  We reduced the lithium temporarily to see if that was the cause of the flatness. Reductions in lithium and depakote did not help the flatness.  No noticeable effect from the Cytomel except it did help the thyroid panel.  At visit Dec 06, 2018.  [name] was switched to Mydayis to try to improve his function full before waking day. Felt it was less effective than [name].  Didn't feel it kick in and no longer duration at maximum 50 mg so returned to [name]. It's better.   Patient was  seen August 2020.  Because his TSH was suppressed we reduced Cytomel to 37.5 mcg daily.  Because of flatness we reduced the Vraylar to 1.5 mg daily to see if there could be improvement.  Patient last seen October.  No med changes were made.  Pretty good overall.  Work is so stressful circumstantial and not sure if going to stay that way.  The new norm so far.  Work function is OK.  Sleep without a problem.  Usually y7-8 hours consistent and with CPAP.  Hard to keep up energy for work bc working 50-60 hours weekly with work. 2 big deadlines includinng 1 Dec 28.   There's been improvement.  Wife agrees.  A lot less depression in fact hasn't felt it lately.  Anxiety related to enormous work project.  Heaviness lifted.  Reduced flatness a lot.  Out of bed more.  Not really a problem any more. No worsening mood swings. Always have a problem with seasonal changes and yet has handled this well this time.    Uncertain about the current status of CPAP.  Compliant with meds.  No med concerns.  Doing DBT.  But not following through with all of it.  Past psychiatric med medication trials include failure of ECT and TMS,  Latuda 120 mg,  aripiprazole, olanzapine 20 mg, Symbyax Seroquel, Rexulti, pramipexole, lamotrigine, ,  Depakote 1500 ataxia,  lithium at high blood levels, Vraylar 3 Pristiq, sertraline, bupropion 450 mg caused anxiety, Lexapro, Viibryd,  Deplin, Duloxetine, Trintellix, [name], Mydayis Concerta 72 mg,  Review of Systems:  Review of Systems  Constitutional: Positive for fatigue.  HENT: Negative for sore throat.   Gastrointestinal: Negative for nausea.       Gagging comes and goes  Neurological: Negative for tremors and weakness.  Psychiatric/Behavioral: Positive for depression. Negative for agitation, behavioral problems, confusion, decreased concentration, dysphoric mood, hallucinations, self-injury, sleep disturbance and suicidal ideas. The patient is nervous/anxious. The patient is not hyperactive.     Medications: I have reviewed the patient's current medications.  Current Outpatient Medications  Medication Sig Dispense Refill  . cariprazine (VRAYLAR) capsule Take 1 capsule (3 mg total) by mouth daily. (Patient taking differently: Take 1.5 mg by mouth daily. ) 30 capsule 2  . divalproex (DEPAKOTE ER) 500 MG 24 hr tablet TAKE 2 TABLETS BY MOUTH AT BEDTIME (Patient taking differently: Take 500 mg by mouth daily. ) 60 tablet 5  . L-Methylfolate 15 MG TABS Take 1 tablet (15 mg total) by mouth daily. 30 tablet 11  . liothyronine (CYTOMEL) 25 MCG tablet Take 1.5 tablets (37.5 mcg total) by mouth daily. 45 tablet 2  . lisdexamfetamine ([name]) 70 MG capsule Take 1 capsule (70 mg total) by mouth daily. 30 capsule 0  . lisdexamfetamine ([name]) 70 MG capsule Take 1 capsule (70 mg total) by mouth daily. 30 capsule 0  . lisdexamfetamine ([name]) 70 MG capsule Take 1 capsule (70 mg total) by mouth daily. 30 capsule 0  . lithium carbonate (LITHOBID) 300 MG CR tablet TAKE 3 TABLETS BY MOUTH EVERY DAY AT BEDTIME (Patient taking differently: Take 600 mg by mouth at bedtime. ) 90 tablet 2  . metFORMIN (GLUCOPHAGE) 500 MG tablet TAKE 2 TABLETS BY MOUTH TWICE A DAY 120 tablet 2  . Omega-3 1000 MG CAPS  Take by mouth.    Marland Kitchen omeprazole (PRILOSEC) 40 MG capsule Take 40 mg by mouth daily.    . traZODone (DESYREL) 50 MG tablet TAKE 1-3 TABLETS BY MOUTH AT BEDTIME. 90 tablet 1  . vitamin B-12 (CYANOCOBALAMIN) 1000 MCG tablet Take by mouth.    . Vitamin D, Ergocalciferol, (DRISDOL) 1.25 MG (50000 UT) CAPS capsule TAKE 1 CAPSULE (50,000 UNITS TOTAL) BY MOUTH EVERY 7 (SEVEN) DAYS. 4 capsule 3   No current facility-administered medications for this visit.     Medication Side Effects: None  Tremor not gone but less  Allergies: No Known Allergies  Past Medical History:  Diagnosis Date  . ADHD   . Allergy   . Anxiety   . Bipolar 2 disorder ([place])   . Depression   . Diabetes mellitus without complication ([place])   . GERD (gastroesophageal reflux disease)   . Hyperlipidemia    diet controlled  . Sleep apnea    uses CPAP nightly  . Thyroid disease     Family History  Problem Relation  Age of Onset  . Asthma Father   . Emphysema Father   . Heart disease Paternal Grandfather   . Heart disease Paternal Grandmother   . Breast cancer Maternal Grandmother   . Colon cancer Maternal Grandfather   . Rectal cancer Neg Hx   . Stomach cancer Neg Hx   . Esophageal cancer Neg Hx     Social History   Socioeconomic History  . Marital status: Married    Spouse name: Not on file  . Number of children: 1  . Years of education: Not on file  . Highest education level: Not on file  Occupational History  . Occupation: Doctor, hospital  . Financial resource strain: Not on file  . Food insecurity    Worry: Not on file    Inability: Not on file  . Transportation needs    Medical: Not on file    Non-medical: Not on file  Tobacco Use  . Smoking status: Former Smoker    Packs/day: 1.00    Years: 7.00    Pack years: 7.00    Types: Cigarettes    Quit date: 07/11/1993    Years since quitting: 25.9  . Smokeless tobacco: Never Used  Substance and Sexual Activity  . Alcohol use: Yes     Alcohol/week: 4.0 standard drinks    Types: 4 Standard drinks or equivalent per week  . Drug use: No  . Sexual activity: Not on file  Lifestyle  . Physical activity    Days per week: Not on file    Minutes per session: Not on file  . Stress: Not on file  Relationships  . Social Herbalist on phone: Not on file    Gets together: Not on file    Attends religious service: Not on file    Active member of club or organization: Not on file    Attends meetings of clubs or organizations: Not on file    Relationship status: Not on file  . Intimate partner violence    Fear of current or ex partner: Not on file    Emotionally abused: Not on file    Physically abused: Not on file    Forced sexual activity: Not on file  Other Topics Concern  . Not on file  Social History Narrative  . Not on file    Past Medical History, Surgical history, Social history, and Family history were reviewed and updated as appropriate.   Son sam.  Please see review of systems for further details on the patient's review from today.   Objective:   Physical Exam:  There were no vitals taken for this visit.  Physical Exam Constitutional:      General: He is not in acute distress.    Appearance: He is well-developed.  Musculoskeletal:        General: No deformity.  Neurological:     Mental Status: He is alert and oriented to person, place, and time.     Cranial Nerves: No dysarthria.     Coordination: Coordination normal.  Psychiatric:        Attention and Perception: Perception normal. He is attentive. He does not perceive auditory or visual hallucinations.        Mood and Affect: Mood is anxious and depressed. Affect is not labile, blunt, angry or inappropriate.        Speech: Speech normal.        Behavior: Behavior normal. Behavior is not agitated or aggressive.  Behavior is cooperative.        Thought Content: Thought content normal. Thought content is not paranoid or delusional. Thought  content does not include homicidal or suicidal ideation. Thought content does not include homicidal or suicidal plan.        Cognition and Memory: Cognition and memory normal.        Judgment: Judgment normal.     Comments: His mood lability is still under control.  He is much less depressed than recent visits over the last couple of years but still some.Marland Kitchen  He still has a little bit of flatness in his affect.  But it is better than before.     Lab Review:     Component Value Date/Time   NA 141 08/26/2016 1959   K 4.0 08/26/2016 1959   CL 112 (H) 08/26/2016 1959   CO2 24 08/26/2016 1959   GLUCOSE 106 (H) 08/26/2016 1959   BUN 19 08/26/2016 1959   CREATININE 1.06 08/26/2016 1959   CALCIUM 8.8 (L) 08/26/2016 1959   PROT 6.7 08/26/2016 1959   ALBUMIN 3.8 08/26/2016 1959   AST 18 08/26/2016 1959   ALT 21 08/26/2016 1959   ALKPHOS 60 08/26/2016 1959   BILITOT 0.6 08/26/2016 1959   GFRNONAA >60 08/26/2016 1959   GFRAA >60 08/26/2016 1959       Component Value Date/Time   WBC 8.6 08/26/2016 1959   RBC 4.35 08/26/2016 1959   HGB 13.7 08/26/2016 1959   HCT 39.8 08/26/2016 1959   PLT 221 08/26/2016 1959   MCV 91.5 08/26/2016 1959   MCH 31.5 08/26/2016 1959   MCHC 34.4 08/26/2016 1959   RDW 12.6 08/26/2016 1959   LYMPHSABS 2.4 08/26/2016 1959   MONOABS 0.4 08/26/2016 1959   EOSABS 0.2 08/26/2016 1959   BASOSABS 0.0 08/26/2016 1959    Lithium Lvl  Date Value Ref Range Status  04/02/2018 0.7 0.6 - 1.2 mmol/L Final     Lab Results  Component Value Date   VALPROATE 54.6 04/02/2018    TSH at 3 .res Assessment: Plan:    Bipolar I disorder with depression ([place])  Attention deficit hyperactivity disorder (ADHD), combined type  Insomnia due to mental condition  Generalized anxiety disorder  Restless legs syndrome  Lithium use  Low vitamin D level  Obstructive sleep apnea   Greater than 50% of face to face time with patient was spent on counseling and coordination  of care. 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  His symptoms have been severe and very resistant to any benefit.    He cannot tolerate an increase in the Depakote based on prior ataxic experiences at 1500 mg daily.  Higher doses of lithium have not helped anymore.  He has failed multiple medications and ECT and [place]. Their primary concern at the recent visits has been flatness.  This is not been helped by his long work hours and heavy work stress.  He still has some significant anxiety due to the heavy workload and demands but he is handling it.  He was not having extreme cycling like he did in the past.  We previously had reduced lithium to 600 mg daily and reduce Depakote to 500 mg daily so those were not changed.  We therefore decided to reduce to Vraylar to 1.5 mg daily.  He is actually taking 3 mg every other day.  That has led to significant  improvement in the level of flatness and reduction in depression.  He is the best he has been in quite some time.  Still need to be aware of the possibility that obstructive sleep apnea might be contributing to some of his symptoms.  He has been more compliant with his CPAP lately. RLS managed.  Pramipexole prn for RLS this is managed at this time.  Continue vitamin D check level later.  Continue B12. Extensive discussions about possible supplements that could be helpful.  He will continue the following. B-Complex 150 Still rec N-Acetylcysteine at 1000-1200 mg daily to help with mild cognitive problems and depression.  It can be combined with a B-complex vitamin as the B-12 and folate have been shown to sometimes enhance the effect. L-methylfolate 15 mg daily for depression.  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  Free T4 0.7 T3 158.9 TSH 0. 01 Therefore at the last visit we reduced Cytomel to 37.5 mcg every  morning.  Cytomel is being used as a depression augmentation strategy.  He had no problems with this and will consider further reduction.  He will see Dr. Concha Pyo soon who is likely to order follow-up lab tests Extensive discussion of various thyroid lab values and use of thyroid meds in depression.    Discussed his polypharmacy which is not ideal but has been necessary to control the mood cycling and now his depression has improved.  Plus he also has ADD that has to be managed.  He is satisfied with his ADD management with [name].  Discussed potential change to a new therapist.  No med changes indicated today.  FU 2 months.  He is aware of the potential for seasonal depression to affect him as we progressed into the fall and winter.  Lynder Parents, MD, DFAPA   Please see After Visit Summary for patient specific instructions.  No future appointments.  No orders of the defined types were placed in this encounter.     -------------------------------

## 2019-06-20 DIAGNOSIS — F341 Dysthymic disorder: Secondary | ICD-10-CM | POA: Diagnosis not present

## 2019-06-20 DIAGNOSIS — F900 Attention-deficit hyperactivity disorder, predominantly inattentive type: Secondary | ICD-10-CM | POA: Diagnosis not present

## 2019-06-26 DIAGNOSIS — G4733 Obstructive sleep apnea (adult) (pediatric): Secondary | ICD-10-CM | POA: Diagnosis not present

## 2019-06-27 DIAGNOSIS — F341 Dysthymic disorder: Secondary | ICD-10-CM | POA: Diagnosis not present

## 2019-06-27 DIAGNOSIS — F900 Attention-deficit hyperactivity disorder, predominantly inattentive type: Secondary | ICD-10-CM | POA: Diagnosis not present

## 2019-07-18 DIAGNOSIS — F341 Dysthymic disorder: Secondary | ICD-10-CM | POA: Diagnosis not present

## 2019-07-18 DIAGNOSIS — F900 Attention-deficit hyperactivity disorder, predominantly inattentive type: Secondary | ICD-10-CM | POA: Diagnosis not present

## 2019-07-25 DIAGNOSIS — F900 Attention-deficit hyperactivity disorder, predominantly inattentive type: Secondary | ICD-10-CM | POA: Diagnosis not present

## 2019-07-25 DIAGNOSIS — F341 Dysthymic disorder: Secondary | ICD-10-CM | POA: Diagnosis not present

## 2019-07-27 DIAGNOSIS — G4733 Obstructive sleep apnea (adult) (pediatric): Secondary | ICD-10-CM | POA: Diagnosis not present

## 2019-08-01 DIAGNOSIS — F341 Dysthymic disorder: Secondary | ICD-10-CM | POA: Diagnosis not present

## 2019-08-01 DIAGNOSIS — F900 Attention-deficit hyperactivity disorder, predominantly inattentive type: Secondary | ICD-10-CM | POA: Diagnosis not present

## 2019-08-08 DIAGNOSIS — F341 Dysthymic disorder: Secondary | ICD-10-CM | POA: Diagnosis not present

## 2019-08-08 DIAGNOSIS — F900 Attention-deficit hyperactivity disorder, predominantly inattentive type: Secondary | ICD-10-CM | POA: Diagnosis not present

## 2019-08-14 ENCOUNTER — Other Ambulatory Visit: Payer: Self-pay | Admitting: Psychiatry

## 2019-08-15 DIAGNOSIS — F900 Attention-deficit hyperactivity disorder, predominantly inattentive type: Secondary | ICD-10-CM | POA: Diagnosis not present

## 2019-08-15 DIAGNOSIS — F341 Dysthymic disorder: Secondary | ICD-10-CM | POA: Diagnosis not present

## 2019-08-27 ENCOUNTER — Other Ambulatory Visit: Payer: Self-pay | Admitting: Psychiatry

## 2019-08-27 ENCOUNTER — Telehealth: Payer: Self-pay | Admitting: Psychiatry

## 2019-08-27 DIAGNOSIS — F902 Attention-deficit hyperactivity disorder, combined type: Secondary | ICD-10-CM

## 2019-08-27 DIAGNOSIS — G4733 Obstructive sleep apnea (adult) (pediatric): Secondary | ICD-10-CM | POA: Diagnosis not present

## 2019-08-27 MED ORDER — LISDEXAMFETAMINE DIMESYLATE 70 MG PO CAPS: 70.0000 mg | ORAL_CAPSULE | Freq: Every day | ORAL | 0 refills | Status: DC

## 2019-08-27 NOTE — Telephone Encounter (Signed)
Prescription sent

## 2019-08-27 NOTE — Telephone Encounter (Signed)
Patient called and said that he is out of his vyvanse 70 mg. He needs it sent to [facility] on [address]. He has an appt on 2/17

## 2019-08-28 ENCOUNTER — Other Ambulatory Visit: Payer: Self-pay

## 2019-08-28 ENCOUNTER — Ambulatory Visit (INDEPENDENT_AMBULATORY_CARE_PROVIDER_SITE_OTHER): Payer: BC Managed Care – PPO | Admitting: Psychiatry

## 2019-08-28 ENCOUNTER — Encounter: Payer: Self-pay | Admitting: Psychiatry

## 2019-08-28 DIAGNOSIS — R7989 Other specified abnormal findings of blood chemistry: Secondary | ICD-10-CM

## 2019-08-28 DIAGNOSIS — F411 Generalized anxiety disorder: Secondary | ICD-10-CM | POA: Diagnosis not present

## 2019-08-28 DIAGNOSIS — G4733 Obstructive sleep apnea (adult) (pediatric): Secondary | ICD-10-CM

## 2019-08-28 DIAGNOSIS — F902 Attention-deficit hyperactivity disorder, combined type: Secondary | ICD-10-CM

## 2019-08-28 DIAGNOSIS — G2581 Restless legs syndrome: Secondary | ICD-10-CM

## 2019-08-28 DIAGNOSIS — F5105 Insomnia due to other mental disorder: Secondary | ICD-10-CM | POA: Diagnosis not present

## 2019-08-28 DIAGNOSIS — Z79899 Other long term (current) drug therapy: Secondary | ICD-10-CM

## 2019-08-28 DIAGNOSIS — F3163 Bipolar disorder, current episode mixed, severe, without psychotic features: Secondary | ICD-10-CM

## 2019-08-28 DIAGNOSIS — F319 Bipolar disorder, unspecified: Secondary | ICD-10-CM

## 2019-08-28 MED ORDER — LISDEXAMFETAMINE DIMESYLATE 70 MG PO CAPS: 70.0000 mg | ORAL_CAPSULE | Freq: Every day | ORAL | 0 refills | Status: DC

## 2019-08-28 MED ORDER — CARIPRAZINE HCL 1.5 MG PO CAPS: 3.0000 mg | ORAL_CAPSULE | Freq: Every day | ORAL | 1 refills | Status: DC

## 2019-08-28 NOTE — Progress Notes (Signed)
Durel Timian LX:[id number] [birth year] 54 y.o.    Subjective:   Patient ID:  Zachary Dixon is a 54 y.o. (DOB 04-24-1966) male.  Chief Complaint:  Chief Complaint  Patient presents with  . Fatigue  . Sleeping Problem  . Follow-up    mood and anxiety and ADD    Depression        Associated symptoms include fatigue.  Associated symptoms include no decreased concentration and no suicidal ideas.  Past medical history includes anxiety.   Anxiety Symptoms include nervous/anxious behavior. Patient reports no confusion, decreased concentration, nausea or suicidal ideas.    Medication Refill Associated symptoms include fatigue. Pertinent negatives include no nausea, sore throat or weakness.   Jimi Verhoeven presents  today for follow-up of Severe TR bipolar depression and rapid cycling with marked impairment in function at work and home.    at visit October 11, 2018.  Cytomel potentiation was started for depression 25 mcg daily to increase to 50 mcg daily.  In addition there were concerns about flatness and he was wondering if it is related to side effects versus depression.  We reduced the lithium temporarily to see if that was the cause of the flatness. Reductions in lithium and depakote did not help the flatness.  No noticeable effect from the Cytomel except it did help the thyroid panel.  At visit Dec 06, 2018.  [name] was switched to Mydayis to try to improve his function full before waking day. Felt it was less effective than [name].  Didn't feel it kick in and no longer duration at maximum 50 mg so returned to [name]. It's better.   Patient was  seen August 2020.  Because his TSH was suppressed we reduced Cytomel to 37.5 mcg daily.  Because of flatness we reduced the Vraylar to 1.5 mg daily to see if there could be improvement.  Patient last seen December.  No med changes were made.  08/28/19 seen with wife, [name] Has been under more stress at work for 3 mos.  Not markedly depressed  but falls asleep easily in evening and gets tired easily.  Sleeping well with CPAP but may not get to bed early enough.  Falls asleep in chair and then gets to CPAP.  Not enough sleep on nights he has to travel.    Wife concerns.  Doesn't seem happy or interactive.  Used to have good days.  Can be irritable but no anger outbursts.  No mood swings.  No sense of humor which had been there before.  Still anxious and holding it in.  Everyone at work overwhelmed.  Would love to have another job.  She says he's always been stressed by every job.  Uncertain about the current status of CPAP.  Compliant with meds.  No med concerns.  Doing DBT.  But not following through with all of it.  Past psychiatric med medication trials include failure of ECT and TMS,  Latuda 120 mg,  aripiprazole, olanzapine 20 mg, Symbyax Seroquel, Rexulti, pramipexole, lamotrigine, , Depakote 1500 ataxia,  lithium at high blood levels, Vraylar 3 Pristiq, sertraline, bupropion 450 mg caused anxiety, Lexapro, Viibryd,  Deplin, Duloxetine, Trintellix, [name], Mydayis Concerta 72 mg,  Review of Systems:  Review of Systems  Constitutional: Positive for fatigue.  HENT: Negative for sore throat.   Gastrointestinal: Negative for nausea.       Gagging comes and goes  Neurological: Negative for tremors and weakness.  Psychiatric/Behavioral: Positive for depression. Negative for agitation, behavioral problems, confusion, decreased  concentration, dysphoric mood, hallucinations, self-injury, sleep disturbance and suicidal ideas. The patient is nervous/anxious. The patient is not hyperactive.     Medications: I have reviewed the patient's current medications.  Current Outpatient Medications  Medication Sig Dispense Refill  . cariprazine (VRAYLAR) capsule Take 2 capsules (3 mg total) by mouth daily. 30 capsule 1  . divalproex (DEPAKOTE ER) 500 MG 24 hr tablet TAKE 2 TABLETS BY MOUTH AT BEDTIME (Patient taking differently: Take 500  mg by mouth daily. ) 60 tablet 5  . L-Methylfolate 15 MG TABS Take 1 tablet (15 mg total) by mouth daily. 30 tablet 11  . liothyronine (CYTOMEL) 25 MCG tablet TAKE 1 & 1/2 TABLET EVERY DAY 45 tablet 2  . lisdexamfetamine ([name]) 70 MG capsule Take 1 capsule (70 mg total) by mouth daily. 30 capsule 0  . [START ON 09/25/2019] lisdexamfetamine ([name]) 70 MG capsule Take 1 capsule (70 mg total) by mouth daily. 30 capsule 0  . [START ON 10/23/2019] lisdexamfetamine ([name]) 70 MG capsule Take 1 capsule (70 mg total) by mouth daily. 30 capsule 0  . lithium carbonate (LITHOBID) 300 MG CR tablet TAKE 3 TABLETS BY MOUTH EVERY DAY AT BEDTIME (Patient taking differently: Take 600 mg by mouth at bedtime. ) 90 tablet 2  . metFORMIN (GLUCOPHAGE) 500 MG tablet TAKE 2 TABLETS BY MOUTH TWICE A DAY 120 tablet 2  . Omega-3 1000 MG CAPS Take by mouth.    Marland Kitchen omeprazole (PRILOSEC) 40 MG capsule Take 40 mg by mouth daily.    . traZODone (DESYREL) 50 MG tablet TAKE 1-3 TABLETS BY MOUTH AT BEDTIME. 90 tablet 1  . vitamin B-12 (CYANOCOBALAMIN) 1000 MCG tablet Take by mouth.    . Vitamin D, Ergocalciferol, (DRISDOL) 1.25 MG (50000 UT) CAPS capsule TAKE 1 CAPSULE (50,000 UNITS TOTAL) BY MOUTH EVERY 7 (SEVEN) DAYS. 4 capsule 3   No current facility-administered medications for this visit.    Medication Side Effects: None  Tremor not gone but less  Allergies: No Known Allergies  Past Medical History:  Diagnosis Date  . ADHD   . Allergy   . Anxiety   . Bipolar 2 disorder ([place])   . Depression   . Diabetes mellitus without complication ([place])   . GERD (gastroesophageal reflux disease)   . Hyperlipidemia    diet controlled  . Sleep apnea    uses CPAP nightly  . Thyroid disease     Family History  Problem Relation Age of Onset  . Asthma Father   . Emphysema Father   . Heart disease Paternal Grandfather   . Heart disease Paternal Grandmother   . Breast cancer Maternal Grandmother   . Colon cancer Maternal  Grandfather   . Rectal cancer Neg Hx   . Stomach cancer Neg Hx   . Esophageal cancer Neg Hx     Social History   Socioeconomic History  . Marital status: Married    Spouse name: Not on file  . Number of children: 1  . Years of education: Not on file  . Highest education level: Not on file  Occupational History  . Occupation: Pharmacologist  Tobacco Use  . Smoking status: Former Smoker    Packs/day: 1.00    Years: 7.00    Pack years: 7.00    Types: Cigarettes    Quit date: 07/11/1993    Years since quitting: 26.1  . Smokeless tobacco: Never Used  Substance and Sexual Activity  . Alcohol use: Yes    Alcohol/week: 4.0  standard drinks    Types: 4 Standard drinks or equivalent per week  . Drug use: No  . Sexual activity: Not on file  Other Topics Concern  . Not on file  Social History Narrative  . Not on file   Social Determinants of Health   Financial Resource Strain:   . Difficulty of Paying Living Expenses: Not on file  Food Insecurity:   . Worried About Charity fundraiser in the Last Year: Not on file  . Ran Out of Food in the Last Year: Not on file  Transportation Needs:   . Lack of Transportation (Medical): Not on file  . Lack of Transportation (Non-Medical): Not on file  Physical Activity:   . Days of Exercise per Week: Not on file  . Minutes of Exercise per Session: Not on file  Stress:   . Feeling of Stress : Not on file  Social Connections:   . Frequency of Communication with Friends and Family: Not on file  . Frequency of Social Gatherings with Friends and Family: Not on file  . Attends Religious Services: Not on file  . Active Member of Clubs or Organizations: Not on file  . Attends Archivist Meetings: Not on file  . Marital Status: Not on file  Intimate Partner Violence:   . Fear of Current or Ex-Partner: Not on file  . Emotionally Abused: Not on file  . Physically Abused: Not on file  . Sexually Abused: Not on file    Past Medical  History, Surgical history, Social history, and Family history were reviewed and updated as appropriate.   Son sam.  Please see review of systems for further details on the patient's review from today.   Objective:   Physical Exam:  There were no vitals taken for this visit.  Physical Exam Constitutional:      General: He is not in acute distress.    Appearance: He is well-developed.  Musculoskeletal:        General: No deformity.  Neurological:     Mental Status: He is alert and oriented to person, place, and time.     Cranial Nerves: No dysarthria.     Coordination: Coordination normal.  Psychiatric:        Attention and Perception: Perception normal. He is attentive. He does not perceive auditory or visual hallucinations.        Mood and Affect: Mood is anxious and depressed. Affect is not labile, blunt, angry or inappropriate.        Speech: Speech normal.        Behavior: Behavior normal. Behavior is not agitated or aggressive. Behavior is cooperative.        Thought Content: Thought content normal. Thought content is not paranoid or delusional. Thought content does not include homicidal or suicidal ideation. Thought content does not include homicidal or suicidal plan.        Cognition and Memory: Cognition and memory normal.        Judgment: Judgment normal.     Comments: His mood lability is still under control.  ..  flatness in his affect is worse.       Lab Review:     Component Value Date/Time   NA 141 08/26/2016 1959   K 4.0 08/26/2016 1959   CL 112 (H) 08/26/2016 1959   CO2 24 08/26/2016 1959   GLUCOSE 106 (H) 08/26/2016 1959   BUN 19 08/26/2016 1959   CREATININE 1.06 08/26/2016 1959   CALCIUM  8.8 (L) 08/26/2016 1959   PROT 6.7 08/26/2016 1959   ALBUMIN 3.8 08/26/2016 1959   AST 18 08/26/2016 1959   ALT 21 08/26/2016 1959   ALKPHOS 60 08/26/2016 1959   BILITOT 0.6 08/26/2016 1959   GFRNONAA >60 08/26/2016 1959   GFRAA >60 08/26/2016 1959        Component Value Date/Time   WBC 8.6 08/26/2016 1959   RBC 4.35 08/26/2016 1959   HGB 13.7 08/26/2016 1959   HCT 39.8 08/26/2016 1959   PLT 221 08/26/2016 1959   MCV 91.5 08/26/2016 1959   MCH 31.5 08/26/2016 1959   MCHC 34.4 08/26/2016 1959   RDW 12.6 08/26/2016 1959   LYMPHSABS 2.4 08/26/2016 1959   MONOABS 0.4 08/26/2016 1959   EOSABS 0.2 08/26/2016 1959   BASOSABS 0.0 08/26/2016 1959    Lithium Lvl  Date Value Ref Range Status  04/02/2018 0.7 0.6 - 1.2 mmol/L Final     Lab Results  Component Value Date   VALPROATE 54.6 04/02/2018    TSH at 3   Normal ammonia 47 in 2019.  .res Assessment: Plan:    Bipolar I disorder with depression ([place])  Attention deficit hyperactivity disorder (ADHD), combined type - Plan: lisdexamfetamine ([name]) 70 MG capsule, lisdexamfetamine ([name]) 70 MG capsule, lisdexamfetamine ([name]) 70 MG capsule  Generalized anxiety disorder  Insomnia due to mental condition  Restless legs syndrome  Lithium use  Low vitamin D level  Obstructive sleep apnea  Bipolar 1 disorder, mixed, severe ([facility]) - Plan: cariprazine (VRAYLAR) capsule   Greater than 50% of face to face time with patient was spent on counseling and coordination of care. 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  His symptoms have been severe and very resistant to any benefit.    He cannot tolerate an increase in the Depakote based on prior ataxic experiences at 1500 mg daily.  Higher doses of lithium have not helped anymore.  He has failed multiple medications and ECT and [place]. Their primary concern at the recent visits has been flatness.  This is not been helped by his long work hours and heavy work stress.  He still has some significant anxiety due to the heavy workload and demands but he is handling it.  He was not having extreme cycling like he did in the past.  We  previously had reduced lithium to 600 mg daily and reduce Depakote to 500 mg daily so those were not changed.  We therefore decided to reduce to Vraylar to 1.5 mg daily.  He is actually taking 3 mg every other day.  That has led to significant improvement in the level of flatness and reduction in depression.  He is the best he has been in quite some time.  Still need to be aware of the possibility that obstructive sleep apnea might be contributing to some of his symptoms.  He has been more compliant with his CPAP lately. RLS managed.  Pramipexole prn for RLS this is managed at this time.  Continue vitamin D check level later.  Continue B12. Extensive discussions about possible supplements that could be helpful.  He will continue the following. B-Complex 150 Still rec N-Acetylcysteine at 1000-1200 mg daily to help with mild cognitive problems and depression.  It can be combined with a B-complex vitamin as the B-12 and folate have been shown to sometimes enhance the effect. L-methylfolate 15 mg daily for depression.  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  Free T4 0.7 T3 158.9 TSH 0. 01 Therefore at the last visit we reduced Cytomel to 37.5 mcg every morning.  Cytomel is being used as a depression augmentation strategy.  He had no problems with this and will consider further reduction.  He will see Dr. Concha Pyo soon who is likely to order follow-up lab tests Extensive discussion of various thyroid lab values and use of thyroid meds in depression.    Discussed his polypharmacy which is not ideal but has been necessary to control the mood cycling and now his depression has improved.  Plus he also has ADD that has to be managed.  He is satisfied with his ADD management with [name].  Discussed potential change to a new therapist.  Reduce Vraylar to see if flatness is better.  1.5 mg every other day.  FU 2 months.  He is aware of the potential for seasonal depression to  affect him as we progressed into the fall and winter.  Lynder Parents, MD, DFAPA   Please see After Visit Summary for patient specific instructions.  No future appointments.  No orders of the defined types were placed in this encounter.     -------------------------------

## 2019-08-29 DIAGNOSIS — F900 Attention-deficit hyperactivity disorder, predominantly inattentive type: Secondary | ICD-10-CM | POA: Diagnosis not present

## 2019-08-29 DIAGNOSIS — F341 Dysthymic disorder: Secondary | ICD-10-CM | POA: Diagnosis not present

## 2019-08-30 ENCOUNTER — Other Ambulatory Visit: Payer: Self-pay

## 2019-08-30 DIAGNOSIS — F3163 Bipolar disorder, current episode mixed, severe, without psychotic features: Secondary | ICD-10-CM

## 2019-08-30 MED ORDER — CARIPRAZINE HCL 3 MG PO CAPS: 3.0000 mg | ORAL_CAPSULE | Freq: Every day | ORAL | 1 refills | Status: DC

## 2019-09-05 DIAGNOSIS — F341 Dysthymic disorder: Secondary | ICD-10-CM | POA: Diagnosis not present

## 2019-09-05 DIAGNOSIS — F900 Attention-deficit hyperactivity disorder, predominantly inattentive type: Secondary | ICD-10-CM | POA: Diagnosis not present

## 2019-09-12 DIAGNOSIS — F341 Dysthymic disorder: Secondary | ICD-10-CM | POA: Diagnosis not present

## 2019-09-12 DIAGNOSIS — F900 Attention-deficit hyperactivity disorder, predominantly inattentive type: Secondary | ICD-10-CM | POA: Diagnosis not present

## 2019-09-19 ENCOUNTER — Telehealth: Payer: Self-pay | Admitting: Psychiatry

## 2019-09-19 ENCOUNTER — Other Ambulatory Visit: Payer: Self-pay

## 2019-09-19 MED ORDER — CARIPRAZINE HCL 1.5 MG PO CAPS: 1.5000 mg | ORAL_CAPSULE | Freq: Every day | ORAL | 0 refills | Status: DC

## 2019-09-19 NOTE — Telephone Encounter (Signed)
Pt has used up his Vraylar 1.5mg  samples, and would like an Rx sent in for it to [facility] on file.

## 2019-09-19 NOTE — Telephone Encounter (Signed)
RX for Vraylar 1.5 mg 1 daily submitted to [facility], discontinue 3 mg

## 2019-09-22 ENCOUNTER — Other Ambulatory Visit: Payer: Self-pay | Admitting: Psychiatry

## 2019-09-22 NOTE — Telephone Encounter (Signed)
Zachary Dixon documented patient taking 500 mg depakote instead and 600 mg lithium

## 2019-09-24 DIAGNOSIS — G4733 Obstructive sleep apnea (adult) (pediatric): Secondary | ICD-10-CM | POA: Diagnosis not present

## 2019-10-10 DIAGNOSIS — F900 Attention-deficit hyperactivity disorder, predominantly inattentive type: Secondary | ICD-10-CM | POA: Diagnosis not present

## 2019-10-10 DIAGNOSIS — F341 Dysthymic disorder: Secondary | ICD-10-CM | POA: Diagnosis not present

## 2019-10-13 ENCOUNTER — Other Ambulatory Visit: Payer: Self-pay | Admitting: Psychiatry

## 2019-10-17 ENCOUNTER — Encounter: Payer: Self-pay | Admitting: Psychiatry

## 2019-10-17 ENCOUNTER — Ambulatory Visit (INDEPENDENT_AMBULATORY_CARE_PROVIDER_SITE_OTHER): Payer: BC Managed Care – PPO | Admitting: Psychiatry

## 2019-10-17 ENCOUNTER — Other Ambulatory Visit: Payer: Self-pay

## 2019-10-17 DIAGNOSIS — F5105 Insomnia due to other mental disorder: Secondary | ICD-10-CM | POA: Diagnosis not present

## 2019-10-17 DIAGNOSIS — F900 Attention-deficit hyperactivity disorder, predominantly inattentive type: Secondary | ICD-10-CM | POA: Diagnosis not present

## 2019-10-17 DIAGNOSIS — F902 Attention-deficit hyperactivity disorder, combined type: Secondary | ICD-10-CM

## 2019-10-17 DIAGNOSIS — F319 Bipolar disorder, unspecified: Secondary | ICD-10-CM

## 2019-10-17 DIAGNOSIS — F341 Dysthymic disorder: Secondary | ICD-10-CM | POA: Diagnosis not present

## 2019-10-17 MED ORDER — DIVALPROEX SODIUM ER 500 MG PO TB24: 1000.0000 mg | ORAL_TABLET | Freq: Every day | ORAL | 0 refills | Status: DC

## 2019-10-17 MED ORDER — TRAZODONE HCL 50 MG PO TABS: ORAL_TABLET | ORAL | 1 refills | Status: DC

## 2019-10-17 MED ORDER — LITHIUM CARBONATE ER 300 MG PO TBCR: 600.0000 mg | EXTENDED_RELEASE_TABLET | Freq: Every day | ORAL | 0 refills | Status: DC

## 2019-10-17 MED ORDER — LISDEXAMFETAMINE DIMESYLATE 70 MG PO CAPS: 70.0000 mg | ORAL_CAPSULE | Freq: Every day | ORAL | 0 refills | Status: DC

## 2019-10-17 NOTE — Progress Notes (Signed)
Zachary Dixon [id number] 04/14/1966 54 y.o.    Subjective:   Patient ID:  Zachary Dixon is a 54 y.o. (DOB May 09, 1966) male.  Chief Complaint:  Chief Complaint  Patient presents with  . Follow-up    Unstable bipolar disorder and med changes  . Depression  . Anxiety    Depression        Associated symptoms include fatigue.  Associated symptoms include no decreased concentration and no suicidal ideas.  Past medical history includes anxiety.   Anxiety Symptoms include nervous/anxious behavior. Patient reports no confusion, decreased concentration, nausea or suicidal ideas.    Medication Refill Associated symptoms include fatigue. Pertinent negatives include no nausea, sore throat or weakness.   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    at visit October 11, 2018.  Cytomel potentiation was started for depression 25 mcg daily to increase to 50 mcg daily.  In addition there were concerns about flatness and he was wondering if it is related to side effects versus depression.  We reduced the lithium temporarily to see if that was the cause of the flatness. Reductions in lithium and depakote did not help the flatness.  No noticeable effect from the Cytomel except it did help the thyroid panel.  At visit Dec 06, 2018.  [name] was switched to Mydayis to try to improve his function full before waking day. Felt it was less effective than [name].  Didn't feel it kick in and no longer duration at maximum 50 mg so returned to [name]. It's better.   Patient was  seen August 2020.  Because his TSH was suppressed we reduced Cytomel to 37.5 mcg daily.  Because of flatness we reduced the Vraylar to 1.5 mg daily to see if there could be improvement.  Patient last seen December.  No med changes were made.  08/28/19 seen with wife, Zachary Dixon Has been under more stress at work for 3 mos.  Not markedly  depressed but falls asleep easily in evening and gets tired easily.  Sleeping well with CPAP but may not get to bed early enough.  Falls asleep in chair and then gets to CPAP.  Not enough sleep on nights he has to travel.  Wife concerns.  Doesn't seem happy or interactive.  Used to have good days.  Can be irritable but no anger outbursts.  No mood swings.  No sense of humor which had been there before.  Still anxious and holding it in. We decided to reduce Vraylar to 1.5 mg every other day to see if flatness was improved.  Mood lability was under control.  As of 10/17/19:  Better.  Lighter and more energy.  Wife agrees.  Not depressed lately.  Not even small downturns.  Very even.   Zachary Dixon notices better engagement.  Anxiety still there with work.  Big projects coming to close in next month.  Everyone at work overwhelmed.  Would love to have another job.  She says he's always been stressed by every job. Plans to look elsewhere.    100% use status of CPAP.  Compliant with meds.  No med concerns.  Doing DBT.  But not following through with all of it.  Past psychiatric med medication trials include failure of ECT and TMS,  Latuda 120 mg,  aripiprazole, olanzapine 20 mg, Symbyax Seroquel, Rexulti, pramipexole, lamotrigine, , Depakote 1500 ataxia,  lithium at high blood levels, Vraylar 3  Pristiq, sertraline, bupropion 450 mg caused anxiety, Lexapro, Viibryd,  Deplin, Duloxetine, Trintellix, [name], Mydayis Concerta 72 mg,  Review of Systems:  Review of Systems  Constitutional: Positive for fatigue.  HENT: Negative for sore throat.   Gastrointestinal: Negative for nausea.       Gagging comes and goes  Neurological: Negative for tremors and weakness.  Psychiatric/Behavioral: Positive for depression. Negative for agitation, behavioral problems, confusion, decreased concentration, dysphoric mood, hallucinations, self-injury, sleep disturbance and suicidal ideas. The patient is nervous/anxious. The patient  is not hyperactive.     Medications: I have reviewed the patient's current medications.  Current Outpatient Medications  Medication Sig Dispense Refill  . cariprazine (VRAYLAR) capsule Take 1 capsule (1.5 mg total) by mouth daily. (Patient taking differently: Take 1.5 mg by mouth every other day. ) 90 capsule 0  . divalproex (DEPAKOTE ER) 500 MG 24 hr tablet Take 2 tablets (1,000 mg total) by mouth at bedtime. 180 tablet 0  . L-Methylfolate 15 MG TABS Take 1 tablet (15 mg total) by mouth daily. 30 tablet 11  . liothyronine (CYTOMEL) 25 MCG tablet TAKE 1 & 1/2 TABLET EVERY DAY 45 tablet 2  . [START ON 10/23/2019] lisdexamfetamine ([name]) 70 MG capsule Take 1 capsule (70 mg total) by mouth daily. 30 capsule 0  . [START ON 11/14/2019] lisdexamfetamine ([name]) 70 MG capsule Take 1 capsule (70 mg total) by mouth daily. 30 capsule 0  . [START ON 12/12/2019] lisdexamfetamine ([name]) 70 MG capsule Take 1 capsule (70 mg total) by mouth daily. 30 capsule 0  . lithium carbonate (LITHOBID) 300 MG CR tablet Take 2 tablets (600 mg total) by mouth at bedtime. 180 tablet 0  . metFORMIN (GLUCOPHAGE) 500 MG tablet TAKE 2 TABLETS BY MOUTH TWICE A DAY 120 tablet 2  . Omega-3 1000 MG CAPS Take by mouth.    Marland Kitchen omeprazole (PRILOSEC) 40 MG capsule Take 40 mg by mouth daily.    . traZODone (DESYREL) 50 MG tablet TAKE 1-3 TABLETS BY MOUTH AT BEDTIME. 90 tablet 1  . vitamin B-12 (CYANOCOBALAMIN) 1000 MCG tablet Take by mouth.    . Vitamin D, Ergocalciferol, (DRISDOL) 1.25 MG (50000 UNIT) CAPS capsule TAKE 1 CAPSULE (50,000 UNITS TOTAL) BY MOUTH EVERY 7 (SEVEN) DAYS. 4 capsule 3   No current facility-administered medications for this visit.    Medication Side Effects: None  Tremor not gone but less  Allergies: No Known Allergies  Past Medical History:  Diagnosis Date  . ADHD   . Allergy   . Anxiety   . Bipolar 2 disorder ([place])   . Depression   . Diabetes mellitus without complication ([place])   . GERD  (gastroesophageal reflux disease)   . Hyperlipidemia    diet controlled  . Sleep apnea    uses CPAP nightly  . Thyroid disease     Family History  Problem Relation Age of Onset  . Asthma Father   . Emphysema Father   . Heart disease Paternal Grandfather   . Heart disease Paternal Grandmother   . Breast cancer Maternal Grandmother   . Colon cancer Maternal Grandfather   . Rectal cancer Neg Hx   . Stomach cancer Neg Hx   . Esophageal cancer Neg Hx     Social History   Socioeconomic History  . Marital status: Married    Spouse name: Not on file  . Number of children: 1  . Years of education: Not on file  . Highest education level: Not on file  Occupational History  . Occupation: Pharmacologist  Tobacco Use  . Smoking status: Former Smoker    Packs/day: 1.00    Years: 7.00    Pack years: 7.00    Types: Cigarettes    Quit date: 07/11/1993    Years since quitting: 26.2  . Smokeless tobacco: Never Used  Substance and Sexual Activity  . Alcohol use: Yes    Alcohol/week: 4.0 standard drinks    Types: 4 Standard drinks or equivalent per week  . Drug use: No  . Sexual activity: Not on file  Other Topics Concern  . Not on file  Social History Narrative  . Not on file   Social Determinants of Health   Financial Resource Strain:   . Difficulty of Paying Living Expenses:   Food Insecurity:   . Worried About Charity fundraiser in the Last Year:   . Arboriculturist in the Last Year:   Transportation Needs:   . Film/video editor (Medical):   Marland Kitchen Lack of Transportation (Non-Medical):   Physical Activity:   . Days of Exercise per Week:   . Minutes of Exercise per Session:   Stress:   . Feeling of Stress :   Social Connections:   . Frequency of Communication with Friends and Family:   . Frequency of Social Gatherings with Friends and Family:   . Attends Religious Services:   . Active Member of Clubs or Organizations:   . Attends Archivist Meetings:   Marland Kitchen  Marital Status:   Intimate Partner Violence:   . Fear of Current or Ex-Partner:   . Emotionally Abused:   Marland Kitchen Physically Abused:   . Sexually Abused:     Past Medical History, Surgical history, Social history, and Family history were reviewed and updated as appropriate.   Son sam.  Please see review of systems for further details on the patient's review from today.   Objective:   Physical Exam:  There were no vitals taken for this visit.  Physical Exam Constitutional:      General: He is not in acute distress.    Appearance: He is well-developed.  Musculoskeletal:        General: No deformity.  Neurological:     Mental Status: He is alert and oriented to person, place, and time.     Cranial Nerves: No dysarthria.     Coordination: Coordination normal.  Psychiatric:        Attention and Perception: Perception normal. He is attentive. He does not perceive auditory or visual hallucinations.        Mood and Affect: Mood is anxious. Mood is not depressed. Affect is not labile, blunt, angry or inappropriate.        Speech: Speech normal.        Behavior: Behavior normal. Behavior is not agitated or aggressive. Behavior is cooperative.        Thought Content: Thought content normal. Thought content is not paranoid or delusional. Thought content does not include homicidal or suicidal ideation. Thought content does not include homicidal or suicidal plan.        Cognition and Memory: Cognition and memory normal.        Judgment: Judgment normal.     Comments: His mood lability is still under control.  ..  flatness in his affect is better      Lab Review:     Component Value Date/Time   NA 141 08/26/2016 1959   K 4.0 08/26/2016 1959  CL 112 (H) 08/26/2016 1959   CO2 24 08/26/2016 1959   GLUCOSE 106 (H) 08/26/2016 1959   BUN 19 08/26/2016 1959   CREATININE 1.06 08/26/2016 1959   CALCIUM 8.8 (L) 08/26/2016 1959   PROT 6.7 08/26/2016 1959   ALBUMIN 3.8 08/26/2016 1959   AST 18  08/26/2016 1959   ALT 21 08/26/2016 1959   ALKPHOS 60 08/26/2016 1959   BILITOT 0.6 08/26/2016 1959   GFRNONAA >60 08/26/2016 1959   GFRAA >60 08/26/2016 1959       Component Value Date/Time   WBC 8.6 08/26/2016 1959   RBC 4.35 08/26/2016 1959   HGB 13.7 08/26/2016 1959   HCT 39.8 08/26/2016 1959   PLT 221 08/26/2016 1959   MCV 91.5 08/26/2016 1959   MCH 31.5 08/26/2016 1959   MCHC 34.4 08/26/2016 1959   RDW 12.6 08/26/2016 1959   LYMPHSABS 2.4 08/26/2016 1959   MONOABS 0.4 08/26/2016 1959   EOSABS 0.2 08/26/2016 1959   BASOSABS 0.0 08/26/2016 1959    Lithium Lvl  Date Value Ref Range Status  04/02/2018 0.7 0.6 - 1.2 mmol/L Final     Lab Results  Component Value Date   VALPROATE 54.6 04/02/2018    TSH at 3   Normal ammonia 47 in 2019.  .res Assessment: Plan:    Bipolar I disorder with depression ([place]) - Plan: lithium carbonate (LITHOBID) 300 MG CR tablet, divalproex (DEPAKOTE ER) 500 MG 24 hr tablet  Attention deficit hyperactivity disorder (ADHD), combined type - Plan: lisdexamfetamine ([name]) 70 MG capsule, lisdexamfetamine ([name]) 70 MG capsule  Insomnia due to mental condition - Plan: traZODone (DESYREL) 50 MG tablet   Greater than 50% of face to face time with patient was spent on counseling and coordination of care. 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  His symptoms have been severe and very resistant to any benefit.    He cannot tolerate an increase in the Depakote based on prior ataxic experiences at 1500 mg daily.  Higher doses of lithium have not helped anymore.  He has failed multiple medications and ECT and [place]. Their primary concern at the recent visits has been flatness.  This is not been helped by his long work hours and heavy work stress.  He still has some significant anxiety due to the heavy workload and demands but he is handling it.   Ever the flatness is much improved with the reduction in in Vraylar to 1.5 mg every other day.  He has had no worsening anxiety, mood swings nor depression.  He was not having extreme cycling like he did in the past.  We previously had reduced lithium to 600 mg daily and reduce Depakote to 500 mg daily so those were not changed.  He has been more compliant with his CPAP lately. RLS managed.  Pramipexole prn for RLS this is managed at this time.  Continue vitamin D check level later.  Continue B12. Extensive discussions about possible supplements that could be helpful.  He will continue the following. B-Complex 150 Still rec N-Acetylcysteine at 1000-1200 mg daily to help with mild cognitive problems and depression.  It can be combined with a B-complex vitamin as the B-12 and folate have been shown to sometimes enhance the effect. L-methylfolate 15 mg daily for depression.  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  Free T4 0.7 T3 158.9 TSH  0. 01 Therefore at the last visit we reduced Cytomel to 37.5 mcg every morning.  Cytomel is being used as a depression augmentation strategy.  He had no problems with this and will consider further reduction.  He will see Dr. Concha Pyo soon who is likely to order follow-up lab tests Extensive discussion of various thyroid lab values and use of thyroid meds in depression.    Discussed his polypharmacy which is not ideal but has been necessary to control the mood cycling and now his depression has improved.  Plus he also has ADD that has to be managed.  He is satisfied with his ADD management with [name].  Discussed potential change to a new therapist.  Better with Reduced Vraylar re: flatness is better.  1.5 mg every other day.  No med changes today  FU 3 months.    Lynder Parents, MD, DFAPA   Please see After Visit Summary for patient specific instructions.  Future Appointments  Date Time Provider [place]   01/16/2020  1:30 PM Cottle, Billey Co., MD [facility] None    No orders of the defined types were placed in this encounter.     -------------------------------

## 2019-10-24 DIAGNOSIS — F341 Dysthymic disorder: Secondary | ICD-10-CM | POA: Diagnosis not present

## 2019-10-24 DIAGNOSIS — F900 Attention-deficit hyperactivity disorder, predominantly inattentive type: Secondary | ICD-10-CM | POA: Diagnosis not present

## 2019-10-25 DIAGNOSIS — G4733 Obstructive sleep apnea (adult) (pediatric): Secondary | ICD-10-CM | POA: Diagnosis not present

## 2019-11-14 DIAGNOSIS — F900 Attention-deficit hyperactivity disorder, predominantly inattentive type: Secondary | ICD-10-CM | POA: Diagnosis not present

## 2019-11-14 DIAGNOSIS — F341 Dysthymic disorder: Secondary | ICD-10-CM | POA: Diagnosis not present

## 2019-11-19 ENCOUNTER — Other Ambulatory Visit: Payer: Self-pay | Admitting: Psychiatry

## 2019-11-21 DIAGNOSIS — F341 Dysthymic disorder: Secondary | ICD-10-CM | POA: Diagnosis not present

## 2019-11-21 DIAGNOSIS — F900 Attention-deficit hyperactivity disorder, predominantly inattentive type: Secondary | ICD-10-CM | POA: Diagnosis not present

## 2019-12-12 DIAGNOSIS — F900 Attention-deficit hyperactivity disorder, predominantly inattentive type: Secondary | ICD-10-CM | POA: Diagnosis not present

## 2019-12-12 DIAGNOSIS — F341 Dysthymic disorder: Secondary | ICD-10-CM | POA: Diagnosis not present

## 2019-12-17 ENCOUNTER — Other Ambulatory Visit: Payer: Self-pay | Admitting: Psychiatry

## 2019-12-19 DIAGNOSIS — F341 Dysthymic disorder: Secondary | ICD-10-CM | POA: Diagnosis not present

## 2019-12-19 DIAGNOSIS — F900 Attention-deficit hyperactivity disorder, predominantly inattentive type: Secondary | ICD-10-CM | POA: Diagnosis not present

## 2019-12-23 DIAGNOSIS — G4733 Obstructive sleep apnea (adult) (pediatric): Secondary | ICD-10-CM | POA: Diagnosis not present

## 2019-12-26 DIAGNOSIS — F341 Dysthymic disorder: Secondary | ICD-10-CM | POA: Diagnosis not present

## 2019-12-26 DIAGNOSIS — F900 Attention-deficit hyperactivity disorder, predominantly inattentive type: Secondary | ICD-10-CM | POA: Diagnosis not present

## 2020-01-02 DIAGNOSIS — F341 Dysthymic disorder: Secondary | ICD-10-CM | POA: Diagnosis not present

## 2020-01-02 DIAGNOSIS — F900 Attention-deficit hyperactivity disorder, predominantly inattentive type: Secondary | ICD-10-CM | POA: Diagnosis not present

## 2020-01-09 ENCOUNTER — Telehealth (INDEPENDENT_AMBULATORY_CARE_PROVIDER_SITE_OTHER): Payer: BC Managed Care – PPO | Admitting: Psychiatry

## 2020-01-09 ENCOUNTER — Encounter: Payer: Self-pay | Admitting: Psychiatry

## 2020-01-09 DIAGNOSIS — F902 Attention-deficit hyperactivity disorder, combined type: Secondary | ICD-10-CM | POA: Diagnosis not present

## 2020-01-09 DIAGNOSIS — F319 Bipolar disorder, unspecified: Secondary | ICD-10-CM

## 2020-01-09 DIAGNOSIS — F411 Generalized anxiety disorder: Secondary | ICD-10-CM | POA: Diagnosis not present

## 2020-01-09 DIAGNOSIS — G2581 Restless legs syndrome: Secondary | ICD-10-CM

## 2020-01-09 DIAGNOSIS — F5105 Insomnia due to other mental disorder: Secondary | ICD-10-CM | POA: Diagnosis not present

## 2020-01-09 DIAGNOSIS — Z79899 Other long term (current) drug therapy: Secondary | ICD-10-CM

## 2020-01-09 DIAGNOSIS — G4733 Obstructive sleep apnea (adult) (pediatric): Secondary | ICD-10-CM

## 2020-01-09 MED ORDER — LISDEXAMFETAMINE DIMESYLATE 70 MG PO CAPS: 70.0000 mg | ORAL_CAPSULE | Freq: Every day | ORAL | 0 refills | Status: DC

## 2020-01-09 MED ORDER — LIOTHYRONINE SODIUM 25 MCG PO TABS: ORAL_TABLET | ORAL | 1 refills | Status: DC

## 2020-01-09 MED ORDER — CARIPRAZINE HCL 1.5 MG PO CAPS: 1.5000 mg | ORAL_CAPSULE | Freq: Every day | ORAL | 2 refills | Status: DC

## 2020-01-09 MED ORDER — DIVALPROEX SODIUM ER 500 MG PO TB24: 1000.0000 mg | ORAL_TABLET | Freq: Every day | ORAL | 1 refills | Status: DC

## 2020-01-09 MED ORDER — LITHIUM CARBONATE ER 300 MG PO TBCR: 600.0000 mg | EXTENDED_RELEASE_TABLET | Freq: Every day | ORAL | 1 refills | Status: DC

## 2020-01-09 NOTE — Progress Notes (Signed)
Zachary Dixon [id number] [birth year] 54 y.o.  Video Visit via My Chart  I connected with pt by My Chart and verified that I am speaking with the correct person using two identifiers.   I discussed the limitations, risks, security and privacy concerns of performing an evaluation and management service by My Chart  and the availability of in person appointments. I also discussed with the patient that there may be a patient responsible charge related to this service. The patient expressed understanding and agreed to proceed.  I discussed the assessment and treatment plan with the patient. The patient was provided an opportunity to ask questions and all were answered. The patient agreed with the plan and demonstrated an understanding of the instructions.   The patient was advised to call back or seek an in-person evaluation if the symptoms worsen or if the condition fails to improve as anticipated.  I provided 30 minutes of video time during this encounter.  The patient was located at home and the provider was located office. Call started at 2 and ended at 230  Subjective:   Patient ID:  Zachary Dixon is a 54 y.o. (DOB 10/15/1965) male.  Chief Complaint:  Chief Complaint  Patient presents with  . Follow-up    mood and meds  . ADHD  . Sleeping Problem    Depression        Associated symptoms include fatigue.  Associated symptoms include no decreased concentration and no suicidal ideas.  Past medical history includes anxiety.   Anxiety Symptoms include nervous/anxious behavior. Patient reports no confusion, decreased concentration, nausea or suicidal ideas.    Medication Refill Associated symptoms include fatigue. Pertinent negatives include no nausea, sore throat or weakness.   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    at visit October 11, 2018.  Cytomel potentiation was  started for depression 25 mcg daily to increase to 50 mcg daily.  In addition there were concerns about flatness and he was wondering if it is related to side effects versus depression.  We reduced the lithium temporarily to see if that was the cause of the flatness. Reductions in lithium and depakote did not help the flatness.  No noticeable effect from the Cytomel except it did help the thyroid panel.  At visit Dec 06, 2018.  [name] was switched to Mydayis to try to improve his function full before waking day. Felt it was less effective than [name].  Didn't feel it kick in and no longer duration at maximum 50 mg so returned to [name]. It's better.   Patient was  seen August 2020.  Because his TSH was suppressed we reduced Cytomel to 37.5 mcg daily.  Because of flatness we reduced the Vraylar to 1.5 mg daily to see if there could be improvement.  Patient last seen December.  No med changes were made.  08/28/19 seen with wife, [name] Has been under more stress at work for 3 mos.  Not markedly depressed but falls asleep easily in evening and gets tired easily.  Sleeping well with CPAP but may not get to bed early enough.  Falls asleep in chair and then gets to CPAP.  Not enough sleep on nights he has to travel.  Wife concerns.  Doesn't seem happy or interactive.  Used to have good days.  Can be irritable but no anger outbursts.  No mood swings.  No sense of humor  which had been there before.  Still anxious and holding it in. We decided to reduce Vraylar to 1.5 mg every other day to see if flatness was improved.  Mood lability was under control.  As of 10/17/19:  Better.  Lighter and more energy.  Wife agrees.  Not depressed lately.  Not even small downturns.  Very even.   [name] notices better engagement.  Anxiety still there with work.  Big projects coming to close in next month.  Everyone at work overwhelmed.  Would love to have another job.  She says he's always been stressed by every job. Plans to look  elsewhere.   100% use status of CPAP. Compliant with meds.  No med concerns. Plan: Better with Reduced Vraylar re: flatness is better.  1.5 mg every other day. No med changes today  01/09/2020 appointment with the following noted: Consistent with meds.  Pretty good without complaints. Still has prominent gag reflex but has had it his whole life but seems worse than in years past.  Makes it even hard to wear CPAP.  Not wearing it 8 hours but 5-6 hours now and 2-3 weeks ago wasn't wearing it.  Feels much better after not wearing it. Getting 8 hours sleep per night. Mood, anxiety and focus steady otherwise. No SE with meds.  Doing DBT.  But not following through with all of it.  Past psychiatric med medication trials include failure of ECT and TMS,  Latuda 120 mg,  aripiprazole, olanzapine 20 mg, Symbyax Seroquel, Rexulti, pramipexole, lamotrigine, , Depakote 1500 ataxia,  lithium at high blood levels, Vraylar 3 Pristiq, sertraline, bupropion 450 mg caused anxiety, Lexapro, Viibryd,  Deplin, Duloxetine, Trintellix, [name], Mydayis Concerta 72 mg,  Review of Systems:  Review of Systems  Constitutional: Positive for fatigue.  HENT: Negative for sore throat.   Gastrointestinal: Negative for nausea.       Gagging comes and goes ongoing  Neurological: Negative for tremors and weakness.  Psychiatric/Behavioral: Positive for depression. Negative for agitation, behavioral problems, confusion, decreased concentration, dysphoric mood, hallucinations, self-injury, sleep disturbance and suicidal ideas. The patient is nervous/anxious. The patient is not hyperactive.     Medications: I have reviewed the patient's current medications.  Current Outpatient Medications  Medication Sig Dispense Refill  . cariprazine (VRAYLAR) capsule Take 1 capsule (1.5 mg total) by mouth daily. 30 capsule 2  . divalproex (DEPAKOTE ER) 500 MG 24 hr tablet Take 2 tablets (1,000 mg total) by mouth at bedtime. 180 tablet 1   . L-Methylfolate 15 MG TABS Take 1 tablet (15 mg total) by mouth daily. 30 tablet 11  . liothyronine (CYTOMEL) 25 MCG tablet 1 and 1/2 tablet each morning 135 tablet 1  . [START ON 03/05/2020] lisdexamfetamine ([name]) 70 MG capsule Take 1 capsule (70 mg total) by mouth daily. 30 capsule 0  . [START ON 02/06/2020] lisdexamfetamine ([name]) 70 MG capsule Take 1 capsule (70 mg total) by mouth daily. 30 capsule 0  . lisdexamfetamine ([name]) 70 MG capsule Take 1 capsule (70 mg total) by mouth daily. 30 capsule 0  . lithium carbonate (LITHOBID) 300 MG CR tablet Take 2 tablets (600 mg total) by mouth at bedtime. 180 tablet 1  . metFORMIN (GLUCOPHAGE) 500 MG tablet TAKE 2 TABLETS BY MOUTH TWICE A DAY 120 tablet 2  . Omega-3 1000 MG CAPS Take by mouth.    Marland Kitchen omeprazole (PRILOSEC) 40 MG capsule Take 40 mg by mouth daily.    . traZODone (DESYREL) 50 MG tablet TAKE  1-3 TABLETS BY MOUTH AT BEDTIME. 90 tablet 1  . vitamin B-12 (CYANOCOBALAMIN) 1000 MCG tablet Take by mouth.    . Vitamin D, Ergocalciferol, (DRISDOL) 1.25 MG (50000 UNIT) CAPS capsule TAKE 1 CAPSULE (50,000 UNITS TOTAL) BY MOUTH EVERY 7 (SEVEN) DAYS. 4 capsule 3   No current facility-administered medications for this visit.    Medication Side Effects: None  Tremor not gone but less  Allergies: No Known Allergies  Past Medical History:  Diagnosis Date  . ADHD   . Allergy   . Anxiety   . Bipolar 2 disorder ([place])   . Depression   . Diabetes mellitus without complication ([place])   . GERD (gastroesophageal reflux disease)   . Hyperlipidemia    diet controlled  . Sleep apnea    uses CPAP nightly  . Thyroid disease     Family History  Problem Relation Age of Onset  . Asthma Father   . Emphysema Father   . Heart disease Paternal Grandfather   . Heart disease Paternal Grandmother   . Breast cancer Maternal Grandmother   . Colon cancer Maternal Grandfather   . Rectal cancer Neg Hx   . Stomach cancer Neg Hx   . Esophageal cancer  Neg Hx     Social History   Socioeconomic History  . Marital status: Married    Spouse name: Not on file  . Number of children: 1  . Years of education: Not on file  . Highest education level: Not on file  Occupational History  . Occupation: Pharmacologist  Tobacco Use  . Smoking status: Former Smoker    Packs/day: 1.00    Years: 7.00    Pack years: 7.00    Types: Cigarettes    Quit date: 07/11/1993    Years since quitting: 26.5  . Smokeless tobacco: Never Used  Vaping Use  . Vaping Use: Never used  Substance and Sexual Activity  . Alcohol use: Yes    Alcohol/week: 4.0 standard drinks    Types: 4 Standard drinks or equivalent per week  . Drug use: No  . Sexual activity: Not on file  Other Topics Concern  . Not on file  Social History Narrative  . Not on file   Social Determinants of Health   Financial Resource Strain:   . Difficulty of Paying Living Expenses:   Food Insecurity:   . Worried About Charity fundraiser in the Last Year:   . Arboriculturist in the Last Year:   Transportation Needs:   . Film/video editor (Medical):   Marland Kitchen Lack of Transportation (Non-Medical):   Physical Activity:   . Days of Exercise per Week:   . Minutes of Exercise per Session:   Stress:   . Feeling of Stress :   Social Connections:   . Frequency of Communication with Friends and Family:   . Frequency of Social Gatherings with Friends and Family:   . Attends Religious Services:   . Active Member of Clubs or Organizations:   . Attends Archivist Meetings:   Marland Kitchen Marital Status:   Intimate Partner Violence:   . Fear of Current or Ex-Partner:   . Emotionally Abused:   Marland Kitchen Physically Abused:   . Sexually Abused:     Past Medical History, Surgical history, Social history, and Family history were reviewed and updated as appropriate.   Son sam.  Please see review of systems for further details on the patient's review from today.   Objective:  Physical Exam:  There were  no vitals taken for this visit.  Physical Exam Neurological:     Mental Status: He is alert and oriented to person, place, and time.     Cranial Nerves: No dysarthria.  Psychiatric:        Attention and Perception: Attention and perception normal.        Mood and Affect: Mood normal.        Speech: Speech normal.        Behavior: Behavior is cooperative.        Thought Content: Thought content normal. Thought content is not paranoid or delusional. Thought content does not include homicidal or suicidal ideation. Thought content does not include homicidal or suicidal plan.        Cognition and Memory: Cognition and memory normal.        Judgment: Judgment normal.     Comments: Insight intact     Lab Review:     Component Value Date/Time   NA 141 08/26/2016 1959   K 4.0 08/26/2016 1959   CL 112 (H) 08/26/2016 1959   CO2 24 08/26/2016 1959   GLUCOSE 106 (H) 08/26/2016 1959   BUN 19 08/26/2016 1959   CREATININE 1.06 08/26/2016 1959   CALCIUM 8.8 (L) 08/26/2016 1959   PROT 6.7 08/26/2016 1959   ALBUMIN 3.8 08/26/2016 1959   AST 18 08/26/2016 1959   ALT 21 08/26/2016 1959   ALKPHOS 60 08/26/2016 1959   BILITOT 0.6 08/26/2016 1959   GFRNONAA >60 08/26/2016 1959   GFRAA >60 08/26/2016 1959       Component Value Date/Time   WBC 8.6 08/26/2016 1959   RBC 4.35 08/26/2016 1959   HGB 13.7 08/26/2016 1959   HCT 39.8 08/26/2016 1959   PLT 221 08/26/2016 1959   MCV 91.5 08/26/2016 1959   MCH 31.5 08/26/2016 1959   MCHC 34.4 08/26/2016 1959   RDW 12.6 08/26/2016 1959   LYMPHSABS 2.4 08/26/2016 1959   MONOABS 0.4 08/26/2016 1959   EOSABS 0.2 08/26/2016 1959   BASOSABS 0.0 08/26/2016 1959    Lithium Lvl  Date Value Ref Range Status  04/02/2018 0.7 0.6 - 1.2 mmol/L Final     Lab Results  Component Value Date   VALPROATE 54.6 04/02/2018    TSH at 3   Normal ammonia 47 in 2019.  .res Assessment: Plan:    Bipolar I disorder with depression ([place]) - Plan: divalproex  (DEPAKOTE ER) 500 MG 24 hr tablet, lithium carbonate (LITHOBID) 300 MG CR tablet  Attention deficit hyperactivity disorder (ADHD), combined type - Plan: lisdexamfetamine ([name]) 70 MG capsule, lisdexamfetamine ([name]) 70 MG capsule, lisdexamfetamine ([name]) 70 MG capsule  Insomnia due to mental condition  Generalized anxiety disorder  Restless legs syndrome  Lithium use  Obstructive sleep apnea   Greater than 50% of face to face time with patient was spent on counseling and coordination of care. 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  His symptoms have been severe and very resistant to any benefit.    He cannot tolerate an increase in the Depakote based on prior ataxic experiences at 1500 mg daily.  Higher doses of lithium have not helped anymore.  He has failed multiple medications and ECT and [place]. Their primary concern at the recent visits has been flatness.  HowEver the flatness is much improved with the reduction in in Vraylar to 1.5 mg every  other day.  He has had no worsening anxiety, mood swings nor depression.  He was not having extreme cycling like he did in the past.  We previously had reduced lithium to 600 mg daily and reduce Depakote to 500 mg daily so those were not changed.  He has been more compliant with his CPAP lately. RLS managed.  Pramipexole prn for RLS this is managed at this time.  Continue vitamin D check level later.  Continue B12. Extensive discussions about possible supplements that could be helpful.  He will continue the following. B-Complex 150 Still rec N-Acetylcysteine at 1000-1200 mg daily to help with mild cognitive problems and depression.  It can be combined with a B-complex vitamin as the B-12 and folate have been shown to sometimes enhance the effect. L-methylfolate 15 mg daily for depression.  He had 1 of 2 genes that were abnormal with  methyl folate reductase.  Free T4 0.7 T3 158.9 TSH 0. 01 Therefore at the last visit we reduced Cytomel to 37.5 mcg every morning.  Cytomel is being used as a depression augmentation strategy.  He had no problems with this and will consider further reduction.  He will see Dr. Concha Pyo soon who is likely to order follow-up lab tests Extensive discussion of various thyroid lab values and use of thyroid meds in depression.    Discussed his polypharmacy which is not ideal but has been necessary to control the mood cycling and now his depression has improved.  Plus he also has ADD that has to be managed.  He is satisfied with his ADD management with [name].  Discussed potential change to a new therapist.  Better with Reduced Vraylar re: flatness is better.  1.5 mg every other day.  No med changes today  FU 4 months.    Lynder Parents, MD, DFAPA   Please see After Visit Summary for patient specific instructions.  No future appointments.  No orders of the defined types were placed in this encounter.     -------------------------------

## 2020-01-14 ENCOUNTER — Other Ambulatory Visit: Payer: Self-pay | Admitting: Psychiatry

## 2020-01-16 ENCOUNTER — Ambulatory Visit: Payer: BC Managed Care – PPO | Admitting: Psychiatry

## 2020-01-23 DIAGNOSIS — F341 Dysthymic disorder: Secondary | ICD-10-CM | POA: Diagnosis not present

## 2020-01-23 DIAGNOSIS — F900 Attention-deficit hyperactivity disorder, predominantly inattentive type: Secondary | ICD-10-CM | POA: Diagnosis not present

## 2020-01-30 DIAGNOSIS — F341 Dysthymic disorder: Secondary | ICD-10-CM | POA: Diagnosis not present

## 2020-01-30 DIAGNOSIS — F900 Attention-deficit hyperactivity disorder, predominantly inattentive type: Secondary | ICD-10-CM | POA: Diagnosis not present

## 2020-02-04 ENCOUNTER — Other Ambulatory Visit: Payer: Self-pay | Admitting: Psychiatry

## 2020-02-04 DIAGNOSIS — F5105 Insomnia due to other mental disorder: Secondary | ICD-10-CM

## 2020-02-12 DIAGNOSIS — F341 Dysthymic disorder: Secondary | ICD-10-CM | POA: Diagnosis not present

## 2020-02-12 DIAGNOSIS — F900 Attention-deficit hyperactivity disorder, predominantly inattentive type: Secondary | ICD-10-CM | POA: Diagnosis not present

## 2020-02-14 DIAGNOSIS — K219 Gastro-esophageal reflux disease without esophagitis: Secondary | ICD-10-CM | POA: Diagnosis not present

## 2020-02-14 DIAGNOSIS — J392 Other diseases of pharynx: Secondary | ICD-10-CM | POA: Diagnosis not present

## 2020-02-20 DIAGNOSIS — F341 Dysthymic disorder: Secondary | ICD-10-CM | POA: Diagnosis not present

## 2020-02-20 DIAGNOSIS — F900 Attention-deficit hyperactivity disorder, predominantly inattentive type: Secondary | ICD-10-CM | POA: Diagnosis not present

## 2020-02-27 DIAGNOSIS — F341 Dysthymic disorder: Secondary | ICD-10-CM | POA: Diagnosis not present

## 2020-02-27 DIAGNOSIS — F900 Attention-deficit hyperactivity disorder, predominantly inattentive type: Secondary | ICD-10-CM | POA: Diagnosis not present

## 2020-03-05 DIAGNOSIS — F900 Attention-deficit hyperactivity disorder, predominantly inattentive type: Secondary | ICD-10-CM | POA: Diagnosis not present

## 2020-03-05 DIAGNOSIS — F341 Dysthymic disorder: Secondary | ICD-10-CM | POA: Diagnosis not present

## 2020-03-12 DIAGNOSIS — F341 Dysthymic disorder: Secondary | ICD-10-CM | POA: Diagnosis not present

## 2020-03-12 DIAGNOSIS — F900 Attention-deficit hyperactivity disorder, predominantly inattentive type: Secondary | ICD-10-CM | POA: Diagnosis not present

## 2020-03-19 DIAGNOSIS — F341 Dysthymic disorder: Secondary | ICD-10-CM | POA: Diagnosis not present

## 2020-03-19 DIAGNOSIS — F900 Attention-deficit hyperactivity disorder, predominantly inattentive type: Secondary | ICD-10-CM | POA: Diagnosis not present

## 2020-03-26 DIAGNOSIS — F900 Attention-deficit hyperactivity disorder, predominantly inattentive type: Secondary | ICD-10-CM | POA: Diagnosis not present

## 2020-03-26 DIAGNOSIS — F341 Dysthymic disorder: Secondary | ICD-10-CM | POA: Diagnosis not present

## 2020-04-16 DIAGNOSIS — F341 Dysthymic disorder: Secondary | ICD-10-CM | POA: Diagnosis not present

## 2020-04-16 DIAGNOSIS — F900 Attention-deficit hyperactivity disorder, predominantly inattentive type: Secondary | ICD-10-CM | POA: Diagnosis not present

## 2020-04-17 DIAGNOSIS — Z20822 Contact with and (suspected) exposure to covid-19: Secondary | ICD-10-CM | POA: Diagnosis not present

## 2020-04-22 ENCOUNTER — Ambulatory Visit: Payer: BC Managed Care – PPO | Admitting: Nurse Practitioner

## 2020-04-24 ENCOUNTER — Telehealth: Payer: Self-pay | Admitting: Psychiatry

## 2020-04-24 ENCOUNTER — Other Ambulatory Visit: Payer: Self-pay | Admitting: Psychiatry

## 2020-04-24 DIAGNOSIS — F319 Bipolar disorder, unspecified: Secondary | ICD-10-CM

## 2020-04-24 DIAGNOSIS — F902 Attention-deficit hyperactivity disorder, combined type: Secondary | ICD-10-CM

## 2020-04-24 MED ORDER — LISDEXAMFETAMINE DIMESYLATE 70 MG PO CAPS: 70.0000 mg | ORAL_CAPSULE | Freq: Every day | ORAL | 0 refills | Status: DC

## 2020-04-24 NOTE — Telephone Encounter (Signed)
Pt called to request refill for [name] 70 mg 1/d @ [facility] [place]. Apt 12/6

## 2020-04-24 NOTE — Telephone Encounter (Signed)
Last refill 03/23/2020 Pended for [name] to send for [name]

## 2020-04-30 DIAGNOSIS — F341 Dysthymic disorder: Secondary | ICD-10-CM | POA: Diagnosis not present

## 2020-04-30 DIAGNOSIS — F900 Attention-deficit hyperactivity disorder, predominantly inattentive type: Secondary | ICD-10-CM | POA: Diagnosis not present

## 2020-05-07 DIAGNOSIS — F341 Dysthymic disorder: Secondary | ICD-10-CM | POA: Diagnosis not present

## 2020-05-07 DIAGNOSIS — F900 Attention-deficit hyperactivity disorder, predominantly inattentive type: Secondary | ICD-10-CM | POA: Diagnosis not present

## 2020-05-12 DIAGNOSIS — Z0000 Encounter for general adult medical examination without abnormal findings: Secondary | ICD-10-CM | POA: Diagnosis not present

## 2020-05-12 DIAGNOSIS — E039 Hypothyroidism, unspecified: Secondary | ICD-10-CM | POA: Diagnosis not present

## 2020-05-12 DIAGNOSIS — Z125 Encounter for screening for malignant neoplasm of prostate: Secondary | ICD-10-CM | POA: Diagnosis not present

## 2020-05-12 DIAGNOSIS — Z13220 Encounter for screening for lipoid disorders: Secondary | ICD-10-CM | POA: Diagnosis not present

## 2020-05-12 DIAGNOSIS — R739 Hyperglycemia, unspecified: Secondary | ICD-10-CM | POA: Diagnosis not present

## 2020-05-14 DIAGNOSIS — F900 Attention-deficit hyperactivity disorder, predominantly inattentive type: Secondary | ICD-10-CM | POA: Diagnosis not present

## 2020-05-14 DIAGNOSIS — F341 Dysthymic disorder: Secondary | ICD-10-CM | POA: Diagnosis not present

## 2020-05-15 DIAGNOSIS — B354 Tinea corporis: Secondary | ICD-10-CM | POA: Diagnosis not present

## 2020-05-15 DIAGNOSIS — Z0000 Encounter for general adult medical examination without abnormal findings: Secondary | ICD-10-CM | POA: Diagnosis not present

## 2020-05-15 DIAGNOSIS — E559 Vitamin D deficiency, unspecified: Secondary | ICD-10-CM | POA: Diagnosis not present

## 2020-05-15 DIAGNOSIS — Z1212 Encounter for screening for malignant neoplasm of rectum: Secondary | ICD-10-CM | POA: Diagnosis not present

## 2020-05-15 DIAGNOSIS — E039 Hypothyroidism, unspecified: Secondary | ICD-10-CM | POA: Diagnosis not present

## 2020-05-15 DIAGNOSIS — Z23 Encounter for immunization: Secondary | ICD-10-CM | POA: Diagnosis not present

## 2020-05-16 ENCOUNTER — Other Ambulatory Visit: Payer: Self-pay | Admitting: Psychiatry

## 2020-05-19 ENCOUNTER — Encounter: Payer: Self-pay | Admitting: Nurse Practitioner

## 2020-05-19 ENCOUNTER — Ambulatory Visit (INDEPENDENT_AMBULATORY_CARE_PROVIDER_SITE_OTHER): Payer: BC Managed Care – PPO | Admitting: Nurse Practitioner

## 2020-05-19 VITALS — BP 120/80 | HR 95 | Ht 74.0 in | Wt 221.6 lb

## 2020-05-19 DIAGNOSIS — R6889 Other general symptoms and signs: Secondary | ICD-10-CM | POA: Diagnosis not present

## 2020-05-19 DIAGNOSIS — R0989 Other specified symptoms and signs involving the circulatory and respiratory systems: Secondary | ICD-10-CM

## 2020-05-19 NOTE — Progress Notes (Signed)
ASSESSMENT AND PLAN     # Hx of GERD / throat clearing / overactive gag reflex ( chronic) No heartburn or regurgitation in years on PPI. Doubt hyperactive gag reflex is GI in origin.  Throat clearing could be from GERD though he has had no improvement with PPI . Wonder if sinus drainage contributing.  --For further evaluation patient will be scheduled for EGD.  If EGD negative then he could try stopping PPI.  If recurrent GERD symptoms then PPI could always be resumed or he could try an H2 blocker. Also if EGD fails to show reason for throat clearing and overractive gag reflex then patient plans to see an ENT.  He has seen Dr. Johny Shock at [facility] in the past (questionably for sinus problems). The risks and benefits of EGD were discussed and the patient agrees to proceed.   # Depression, controlled on medication.   # Hyperglycemia, on Metformin  HISTORY OF PRESENT ILLNESS     Primary Gastroenterologist : Experiment Cellar, MD  Chief Complaint : overactive gag reflex and throat clearing. PCP, Dr. Shelia Media, requesting GI evaluation.   Zachary Dixon is a 54 y.o. male with PMH / [place] significant for,  but not necessarily limited to: GERD, hyperglycemia, depression, sleep apnea on CPAP, ADHD, adenomatous colon polyps September 2020   Four years ago patient had a bout of bad depression. He was sleeping on the couch,  drinking Etoh and developed aspiration PNA. He recalls having heartburn and regurgitation at the time and was placed on Omeprazole.  Heartburn and regurgitation resolved but at some point he began having problems with frequent throat clearing. He admits to always have a overactive gag reflex but it has gotten much worse lately. Some triggers include his C-pap maching, COVID masks and brushing his tongue with toothbrush. PCP changed him to Pantoprazole over the summer but neither the throat clearing nor overacttve gag reflex improved.  He has a cough, especially in the am. Cough  productive of mucous. He does have sinus drainage. Denies dysphagia. Patient saw Dr. Johny Shock, ENT at [facility] a couple of years ago but not for these current issues. He cannot remember why he saw ENT, maybe for sinus issues.   Patient's depression in under control. He only drinks 4-5 beers a week now.      Colonoscopy Sept 2020 --Multiple colon polyps --Tubular adenomas --3-year recall colonoscopy recommended  Past Medical History:  Diagnosis Date  . ADHD   . Allergy   . Anxiety   . Bipolar 2 disorder ([place])   . Depression   . Diabetes mellitus without complication ([place])   . GERD (gastroesophageal reflux disease)   . Hyperlipidemia    diet controlled  . Sleep apnea    uses CPAP nightly  . Thyroid disease      Past Surgical History:  Procedure Laterality Date  . ELECTROCONVULSIVE THERAPY  2018   at [place] EXTRACTION     Family History  Problem Relation Age of Onset  . Asthma Father   . Emphysema Father   . Heart disease Paternal Grandfather   . Heart disease Paternal Grandmother   . Breast cancer Maternal Grandmother   . Colon cancer Maternal Grandfather   . Rectal cancer Neg Hx   . Stomach cancer Neg Hx   . Esophageal cancer Neg Hx    Social History   Tobacco Use  . Smoking status: Former Smoker    Packs/day: 1.00  Years: 7.00    Pack years: 7.00    Types: Cigarettes    Quit date: 07/11/1993    Years since quitting: 26.8  . Smokeless tobacco: Never Used  Vaping Use  . Vaping Use: Never used  Substance Use Topics  . Alcohol use: Yes    Alcohol/week: 4.0 standard drinks    Types: 4 Standard drinks or equivalent per week  . Drug use: No   Current Outpatient Medications  Medication Sig Dispense Refill  . cariprazine (VRAYLAR) capsule Take 1 capsule (1.5 mg total) by mouth daily. 30 capsule 2  . divalproex (DEPAKOTE ER) 500 MG 24 hr tablet Take 2 tablets (1,000 mg total) by mouth at bedtime. 180 tablet 1  . L-methylfolate  Calcium 15 MG TABS TAKE 1 TABLET BY MOUTH EVERY DAY 30 tablet 11  . liothyronine (CYTOMEL) 25 MCG tablet 1 and 1/2 tablet each morning 135 tablet 1  . lisdexamfetamine ([name]) 70 MG capsule Take 1 capsule (70 mg total) by mouth daily. 30 capsule 0  . lithium carbonate (LITHOBID) 300 MG CR tablet Take 2 tablets (600 mg total) by mouth at bedtime. 180 tablet 1  . metFORMIN (GLUCOPHAGE) 500 MG tablet TAKE 2 TABLETS BY MOUTH TWICE A DAY 120 tablet 2  . Omega-3 1000 MG CAPS Take by mouth.    . pantoprazole (PROTONIX) 40 MG tablet Take 40 mg by mouth 2 (two) times daily.    . traZODone (DESYREL) 50 MG tablet TAKE 1 TO 3 TABLETS BY MOUTH AT BEDTIME 270 tablet 1  . vitamin B-12 (CYANOCOBALAMIN) 1000 MCG tablet Take by mouth.    . Vitamin D, Ergocalciferol, (DRISDOL) 1.25 MG (50000 UNIT) CAPS capsule TAKE 1 CAPSULE (50,000 UNITS TOTAL) BY MOUTH EVERY 7 (SEVEN) DAYS. 4 capsule 3   No current facility-administered medications for this visit.   No Known Allergies   Review of Systems: No chest pain. No SOB. No urinary problems.   PHYSICAL EXAM :    Wt Readings from Last 3 Encounters:  05/19/20 221 lb 9.6 oz (100.5 kg)  04/04/19 208 lb (94.3 kg)  03/21/19 208 lb 12.8 oz (94.7 kg)    BP 120/80   Pulse 95   Ht 6\' 2"  (1.88 m)   Wt 221 lb 9.6 oz (100.5 kg)   SpO2 99%   BMI 28.45 kg/m  Constitutional:  Pleasant male in no acute distress. Psychiatric: Normal mood and affect. Behavior is normal. EENT: Pupils normal.  Conjunctivae are normal. No scleral icterus. Neck supple.  Cardiovascular: Normal rate, regular rhythm. No edema Pulmonary/chest: Effort normal and breath sounds normal. No wheezing, rales or rhonchi. Abdominal: Soft, nondistended, nontender. Bowel sounds active throughout. There are no masses palpable. No hepatomegaly. Neurological: Alert and oriented to person place and time. Skin: Skin is warm and dry. No rashes noted.  [name], NP  05/19/2020, 8:32 AM  Cc:   Deland Pretty, MD

## 2020-05-19 NOTE — Progress Notes (Signed)
Agree with assessment and plan as outlined. Will await EGD. If normal, may consider 24 hour pH impedance testing OFF PPI to most objectively if reflux is causing his symptoms.

## 2020-05-19 NOTE — Patient Instructions (Signed)
If you are age 52 or older, your body mass index should be between 23-30. Your Body mass index is 28.45 kg/m. If this is out of the aforementioned range listed, please consider follow up with your Primary Care Provider.  If you are age 76 or younger, your body mass index should be between 19-25. Your Body mass index is 28.45 kg/m. If this is out of the aformentioned range listed, please consider follow up with your Primary Care Provider.   You have been scheduled for an endoscopy. Please follow written instructions given to you at your visit today. If you use inhalers (even only as needed), please bring them with you on the day of your procedure.  Due to recent changes in healthcare laws, you may see the results of your imaging and laboratory studies on MyChart before your provider has had a chance to review them.  We understand that in some cases there may be results that are confusing or concerning to you. Not all laboratory results come back in the same time frame and the provider may be waiting for multiple results in order to interpret others.  Please give [place] 48 hours in order for your provider to thoroughly review all the results before contacting the office for clarification of your results.

## 2020-05-25 ENCOUNTER — Other Ambulatory Visit: Payer: Self-pay

## 2020-05-25 ENCOUNTER — Telehealth: Payer: Self-pay | Admitting: Psychiatry

## 2020-05-25 DIAGNOSIS — F902 Attention-deficit hyperactivity disorder, combined type: Secondary | ICD-10-CM

## 2020-05-25 MED ORDER — LISDEXAMFETAMINE DIMESYLATE 70 MG PO CAPS: 70.0000 mg | ORAL_CAPSULE | Freq: Every day | ORAL | 0 refills | Status: DC

## 2020-05-25 NOTE — Telephone Encounter (Signed)
Pended for Dr.Cottle to send Last refill 10/16

## 2020-05-25 NOTE — Telephone Encounter (Signed)
Nicole Kindred called to request refill of his [name].  Appt 06/15/20.  [facility] [place]

## 2020-05-28 DIAGNOSIS — F900 Attention-deficit hyperactivity disorder, predominantly inattentive type: Secondary | ICD-10-CM | POA: Diagnosis not present

## 2020-05-28 DIAGNOSIS — F341 Dysthymic disorder: Secondary | ICD-10-CM | POA: Diagnosis not present

## 2020-06-11 DIAGNOSIS — F900 Attention-deficit hyperactivity disorder, predominantly inattentive type: Secondary | ICD-10-CM | POA: Diagnosis not present

## 2020-06-11 DIAGNOSIS — F341 Dysthymic disorder: Secondary | ICD-10-CM | POA: Diagnosis not present

## 2020-06-12 ENCOUNTER — Encounter: Payer: Self-pay | Admitting: Gastroenterology

## 2020-06-12 ENCOUNTER — Other Ambulatory Visit: Payer: Self-pay

## 2020-06-12 ENCOUNTER — Ambulatory Visit (AMBULATORY_SURGERY_CENTER): Payer: BC Managed Care – PPO | Admitting: Gastroenterology

## 2020-06-12 VITALS — BP 111/69 | HR 74 | Temp 97.8°F | Resp 20 | Ht 74.0 in | Wt 221.0 lb

## 2020-06-12 DIAGNOSIS — K449 Diaphragmatic hernia without obstruction or gangrene: Secondary | ICD-10-CM

## 2020-06-12 DIAGNOSIS — K2951 Unspecified chronic gastritis with bleeding: Secondary | ICD-10-CM | POA: Diagnosis not present

## 2020-06-12 DIAGNOSIS — R0989 Other specified symptoms and signs involving the circulatory and respiratory systems: Secondary | ICD-10-CM

## 2020-06-12 DIAGNOSIS — K2980 Duodenitis without bleeding: Secondary | ICD-10-CM | POA: Diagnosis not present

## 2020-06-12 DIAGNOSIS — D132 Benign neoplasm of duodenum: Secondary | ICD-10-CM | POA: Diagnosis not present

## 2020-06-12 DIAGNOSIS — K317 Polyp of stomach and duodenum: Secondary | ICD-10-CM

## 2020-06-12 DIAGNOSIS — K2900 Acute gastritis without bleeding: Secondary | ICD-10-CM

## 2020-06-12 DIAGNOSIS — K219 Gastro-esophageal reflux disease without esophagitis: Secondary | ICD-10-CM | POA: Diagnosis not present

## 2020-06-12 DIAGNOSIS — K2981 Duodenitis with bleeding: Secondary | ICD-10-CM | POA: Diagnosis not present

## 2020-06-12 DIAGNOSIS — R6889 Other general symptoms and signs: Secondary | ICD-10-CM

## 2020-06-12 DIAGNOSIS — K319 Disease of stomach and duodenum, unspecified: Secondary | ICD-10-CM | POA: Diagnosis not present

## 2020-06-12 MED ORDER — SODIUM CHLORIDE 0.9 % IV SOLN: 500.0000 mL | Freq: Once | INTRAVENOUS | Status: DC

## 2020-06-12 NOTE — Progress Notes (Signed)
Called to room to assist during endoscopic procedure.  Patient ID and intended procedure confirmed with present staff. Received instructions for my participation in the procedure from the performing physician.  

## 2020-06-12 NOTE — Progress Notes (Signed)
VS by CW  I have reviewed the patient's medical history in detail and updated the computerized patient record.  

## 2020-06-12 NOTE — Patient Instructions (Signed)
Handout given:  Gastritis Resume previous  Continue current medications Consider trying protonix twice daily  AVOID NSAIDS:  Aspirin, motrin, aleve, goody powder Await pathology results  YOU HAD AN ENDOSCOPIC PROCEDURE TODAY AT [facility]:   Refer to the procedure report that was given to you for any specific questions about what was found during the examination.  If the procedure report does not answer your questions, please call your gastroenterologist to clarify.  If you requested that your care partner not be given the details of your procedure findings, then the procedure report has been included in a sealed envelope for you to review at your convenience later.  YOU SHOULD EXPECT: Some feelings of bloating in the abdomen. Passage of more gas than usual.  Walking can help get rid of the air that was put into your GI tract during the procedure and reduce the bloating. If you had a lower endoscopy (such as a colonoscopy or flexible sigmoidoscopy) you may notice spotting of blood in your stool or on the toilet paper. If you underwent a bowel prep for your procedure, you may not have a normal bowel movement for a few days.  Please Note:  You might notice some irritation and congestion in your nose or some drainage.  This is from the oxygen used during your procedure.  There is no need for concern and it should clear up in a day or so.  SYMPTOMS TO REPORT IMMEDIATELY:   Following upper endoscopy (EGD)  Vomiting of blood or coffee ground material  New chest pain or pain under the shoulder blades  Painful or persistently difficult swallowing  New shortness of breath  Fever of 100F or higher  Black, tarry-looking stools  For urgent or emergent issues, a gastroenterologist can be reached at any hour by calling [phone]. Do not use MyChart messaging for urgent concerns.   DIET:  We do recommend a small meal at first, but then you may proceed to your regular diet.  Drink  plenty of fluids but you should avoid alcoholic beverages for 24 hours.  ACTIVITY:  You should plan to take it easy for the rest of today and you should NOT DRIVE or use heavy machinery until tomorrow (because of the sedation medicines used during the test).    FOLLOW UP: Our staff will call the number listed on your records 48-72 hours following your procedure to check on you and address any questions or concerns that you may have regarding the information given to you following your procedure. If we do not reach you, we will leave a message.  We will attempt to reach you two times.  During this call, we will ask if you have developed any symptoms of COVID 19. If you develop any symptoms (ie: fever, flu-like symptoms, shortness of breath, cough etc.) before then, please call [phone].  If you test positive for Covid 19 in the 2 weeks post procedure, please call and report this information to [place].    If any biopsies were taken you will be contacted by phone or by letter within the next 1-3 weeks.  Please call us at [phone] if you have not heard about the biopsies in 3 weeks.   SIGNATURES/CONFIDENTIALITY: You and/or your care partner have signed paperwork which will be entered into your electronic medical record.  These signatures attest to the fact that that the information above on your After Visit Summary has been reviewed and is understood.  Full responsibility of  the confidentiality of this discharge information lies with you and/or your care-partner.

## 2020-06-12 NOTE — Progress Notes (Signed)
PT taken to PACU. Monitors in place. VSS. Report given to RN. 

## 2020-06-12 NOTE — Op Note (Signed)
[facility] Patient Name: Zachary Dixon Procedure Date: 06/12/2020 10:31 AM MRN: [id number] Endoscopist: Remo Lipps P. Havery [name] , MD Age: 54 Referring MD:  Date of Birth: 12-Jun-1966 Gender: Male Account #: [id number] Procedure:                Upper GI endoscopy Indications:              Follow-up of gastro-esophageal reflux disease -                            rule out Barrett's, strong gag reflux / frequent                            throat clearing - reflux symptoms mostly controlled                            on protonix 40mg . Of note patient had extremely                            strong gag reflex with bite block placement, had to                            sedate the patient first to place bite block Medicines:                Monitored Anesthesia Care Procedure:                Pre-Anesthesia Assessment:                           - Prior to the procedure, a History and Physical                            was performed, and patient medications and                            allergies were reviewed. The patient's tolerance of                            previous anesthesia was also reviewed. The risks                            and benefits of the procedure and the sedation                            options and risks were discussed with the patient.                            All questions were answered, and informed consent                            was obtained. Prior Anticoagulants: The patient has                            taken no previous anticoagulant or antiplatelet  agents. ASA Grade Assessment: II - A patient with                            mild systemic disease. After reviewing the risks                            and benefits, the patient was deemed in                            satisfactory condition to undergo the procedure.                           After obtaining informed consent, the endoscope was                             passed under direct vision. Throughout the                            procedure, the patient's blood pressure, pulse, and                            oxygen saturations were monitored continuously. The                            Endoscope was introduced through the mouth, and                            advanced to the second part of duodenum. The upper                            GI endoscopy was accomplished without difficulty.                            The patient tolerated the procedure well. Scope In: Scope Out: Findings:                 Esophagogastric landmarks were identified: the                            Z-line was found at 41 cm, the gastroesophageal                            junction was found at 41 cm and the upper extent of                            the gastric folds was found at 42 cm from the                            incisors.                           A 1 cm hiatal hernia was present.  Very focal mild esophagitis was found 41 cm from                            the incisors.                           A single area of ectopic gastric mucosa was found                            in the upper third of the esophagus, 17 cm from the                            incisors.                           The exam of the esophagus was otherwise normal. No                            Barrett's esophagus.                           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                            Biopsies were taken with a cold forceps for                            histology ensure no adenomatous change.                           Localized mild inflammation characterized by                            erosions, erythema and friability was found in the                            gastric antrum. Biopsies were taken  with a cold                            forceps for Helicobacter pylori testing from                            antrum, body, incisura.                           The exam of the stomach was otherwise normal.                           A single 3  mm sessile polyp was found in the                            duodenal bulb. The polyp was removed with a cold                            biopsy forceps. Resection and retrieval were                            complete.                           The exam of the duodenum was otherwise normal. Of                            note, posterior pharynx and vocal cords appeared                            normal. Complications:            No immediate complications. Estimated blood loss:                            Minimal. Estimated Blood Loss:     Estimated blood loss was minimal. Impression:               - Esophagogastric landmarks identified.                           - 1 cm hiatal hernia.                           - LA Grade A reflux esophagitis.                           - Ectopic gastric mucosa in the upper third of the                            esophagus.                           - Normal esophagus otherwise                           - Multiple gastric polyps, suspect benign fundic                            gland polyps. Biopsied to rule out adenoma.                           - Gastritis. Biopsied to rule out H pylori.                           - A single duodenal polyp. Resected and retrieved.                           - Normal duodenum otherwise.  Recommendation:           - Patient has a contact number available for                            emergencies. The signs and symptoms of potential                            delayed complications were discussed with the                            patient. Return to normal activities tomorrow.                            Written discharge instructions were provided to the                             patient.                           - Resume previous diet.                           - Continue present medications.                           - Consider trial of protonix twice daily if have                            not tried that yet                           - Avoid NSAIDs                           - Await pathology results.                           - Recommend ENT evaluation for strong gag reflex /                            throat clearing, I think less likely caused by                            reflux Zachary Dixon. Havery [name], MD 06/12/2020 10:59:24 AM This report has been signed electronically.

## 2020-06-15 ENCOUNTER — Other Ambulatory Visit: Payer: Self-pay

## 2020-06-15 ENCOUNTER — Ambulatory Visit (INDEPENDENT_AMBULATORY_CARE_PROVIDER_SITE_OTHER): Payer: BC Managed Care – PPO | Admitting: Psychiatry

## 2020-06-15 ENCOUNTER — Encounter: Payer: Self-pay | Admitting: Psychiatry

## 2020-06-15 DIAGNOSIS — F5105 Insomnia due to other mental disorder: Secondary | ICD-10-CM

## 2020-06-15 DIAGNOSIS — G2581 Restless legs syndrome: Secondary | ICD-10-CM | POA: Diagnosis not present

## 2020-06-15 DIAGNOSIS — F902 Attention-deficit hyperactivity disorder, combined type: Secondary | ICD-10-CM

## 2020-06-15 DIAGNOSIS — G4733 Obstructive sleep apnea (adult) (pediatric): Secondary | ICD-10-CM

## 2020-06-15 DIAGNOSIS — F411 Generalized anxiety disorder: Secondary | ICD-10-CM

## 2020-06-15 DIAGNOSIS — F319 Bipolar disorder, unspecified: Secondary | ICD-10-CM | POA: Diagnosis not present

## 2020-06-15 DIAGNOSIS — Z79899 Other long term (current) drug therapy: Secondary | ICD-10-CM

## 2020-06-15 DIAGNOSIS — R7989 Other specified abnormal findings of blood chemistry: Secondary | ICD-10-CM

## 2020-06-15 MED ORDER — LIOTHYRONINE SODIUM 25 MCG PO TABS: ORAL_TABLET | ORAL | 0 refills | Status: DC

## 2020-06-15 MED ORDER — LITHIUM CARBONATE ER 300 MG PO TBCR: 600.0000 mg | EXTENDED_RELEASE_TABLET | Freq: Every day | ORAL | 1 refills | Status: DC

## 2020-06-15 MED ORDER — DIVALPROEX SODIUM ER 500 MG PO TB24: 1000.0000 mg | ORAL_TABLET | Freq: Every day | ORAL | 1 refills | Status: DC

## 2020-06-15 MED ORDER — LISDEXAMFETAMINE DIMESYLATE 70 MG PO CAPS: 70.0000 mg | ORAL_CAPSULE | Freq: Every day | ORAL | 0 refills | Status: DC

## 2020-06-15 MED ORDER — CARIPRAZINE HCL 1.5 MG PO CAPS: 1.5000 mg | ORAL_CAPSULE | Freq: Every day | ORAL | 2 refills | Status: DC

## 2020-06-15 NOTE — Progress Notes (Signed)
Zachary Dixon [id number] December 31, 1965 54 y.o.   Subjective:   Patient ID:  Zachary Dixon is a 54 y.o. (DOB [birth year]) male.  Chief Complaint:  Chief Complaint  Patient presents with  . Follow-up  . Depression  . ADHD    Depression        Associated symptoms include fatigue.  Associated symptoms include no decreased concentration and no suicidal ideas.  Past medical history includes anxiety.   Anxiety Symptoms include nervous/anxious behavior. Patient reports no confusion, decreased concentration, nausea or suicidal ideas.    Medication Refill Associated symptoms include fatigue. Pertinent negatives include no nausea, sore throat or weakness.   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    at visit October 11, 2018.  Cytomel potentiation was started for depression 25 mcg daily to increase to 50 mcg daily.  In addition there were concerns about flatness and he was wondering if it is related to side effects versus depression.  We reduced the lithium temporarily to see if that was the cause of the flatness. Reductions in lithium and depakote did not help the flatness.  No noticeable effect from the Cytomel except it did help the thyroid panel.  At visit Dec 06, 2018.  [name] was switched to Mydayis to try to improve his function full before waking day. Felt it was less effective than [name].  Didn't feel it kick in and no longer duration at maximum 50 mg so returned to [name]. It's better.   Patient was  seen August 2020.  Because his TSH was suppressed we reduced Cytomel to 37.5 mcg daily.  Because of flatness we reduced the Vraylar to 1.5 mg daily to see if there could be improvement.  Patient last seen December.  No med changes were made.  08/28/19 seen with wife, Zachary Dixon Has been under more stress at work for 3 mos.  Not markedly depressed but falls asleep easily in evening and  gets tired easily.  Sleeping well with CPAP but may not get to bed early enough.  Falls asleep in chair and then gets to CPAP.  Not enough sleep on nights he has to travel.  Wife concerns.  Doesn't seem happy or interactive.  Used to have good days.  Can be irritable but no anger outbursts.  No mood swings.  No sense of humor which had been there before.  Still anxious and holding it in. We decided to reduce Vraylar to 1.5 mg every other day to see if flatness was improved.  Mood lability was under control.  As of 10/17/19:  Better.  Lighter and more energy.  Wife agrees.  Not depressed lately.  Not even small downturns.  Very even.   Zachary Dixon notices better engagement.  Anxiety still there with work.  Big projects coming to close in next month.  Everyone at work overwhelmed.  Would love to have another job.  She says he's always been stressed by every job. Plans to look elsewhere.   100% use status of CPAP. Compliant with meds.  No med concerns. Plan: Better with Reduced Vraylar re: flatness is better.  1.5 mg every other day. No med changes today  01/09/2020 appointment with the following noted: Consistent with meds.  Pretty good without complaints. Still has prominent gag reflex but has had it his whole life but seems worse than in years past.  Makes it even hard to wear CPAP.  Not wearing it 8 hours but 5-6 hours now and 2-3 weeks ago wasn't wearing it.  Feels much better after not wearing it. Getting 8 hours sleep per night. Mood, anxiety and focus steady otherwise. No SE with meds. Plan Rec trial with NAC for cognition and mood.  Disc in detail  06/15/2020 appointment with the following noted: Nan dx breast CA, stage 1.  No chemo planned.  She's dealing with it pretty well. Work remained stressful.  Lost 3 people but trying to keep perspective with wife's health issues.  Still pursuing another job but little time and energy for that. Mood very good overall.  Primary thing is stress and has to be  careful with it, otherwise anxiety can spend out of control.  Sleeping well. Endoscopy OK in workup with gagging problem.  Gagging interferes with CPAP use.  Can be worse with stress.  Can be difficult to convince doctor's a problem. PCP Zachary Dixon is good.  Overreactive gag reflex.  But sometimes can occur out of sleep.  Doing DBT.  But not following through with all of it.  Past psychiatric med medication trials include failure of ECT and TMS,  Latuda 120 mg,  aripiprazole, olanzapine 20 mg, Symbyax Seroquel, Rexulti, pramipexole, lamotrigine, , Depakote 1500 ataxia,  lithium at high blood levels, Vraylar 3 Pristiq, sertraline, bupropion 450 mg caused anxiety, Lexapro, Viibryd,  Deplin, Duloxetine, Trintellix, [name], Mydayis Concerta 72 mg,  Review of Systems:  Review of Systems  Constitutional: Positive for fatigue.  HENT: Negative for sore throat.   Gastrointestinal: Negative for nausea.       Gagging comes and goes ongoing  Neurological: Negative for tremors and weakness.  Psychiatric/Behavioral: Positive for depression. Negative for agitation, behavioral problems, confusion, decreased concentration, dysphoric mood, hallucinations, self-injury, sleep disturbance and suicidal ideas. The patient is nervous/anxious. The patient is not hyperactive.     Medications: I have reviewed the patient's current medications.  Current Outpatient Medications  Medication Sig Dispense Refill  . cariprazine (VRAYLAR) capsule Take 1 capsule (1.5 mg total) by mouth daily. 30 capsule 2  . divalproex (DEPAKOTE ER) 500 MG 24 hr tablet Take 2 tablets (1,000 mg total) by mouth at bedtime. 180 tablet 1  . L-methylfolate Calcium 15 MG TABS TAKE 1 TABLET BY MOUTH EVERY DAY 30 tablet 11  . liothyronine (CYTOMEL) 25 MCG tablet 1 and 1/2 tablet each morning 135 tablet 0  . lisdexamfetamine ([name]) 70 MG capsule Take 1 capsule (70 mg total) by mouth daily. 30 capsule 0  . lithium carbonate (LITHOBID) 300 MG CR  tablet Take 2 tablets (600 mg total) by mouth at bedtime. 180 tablet 1  . Omega-3 1000 MG CAPS Take by mouth.    . pantoprazole (PROTONIX) 40 MG tablet Take 40 mg by mouth 2 (two) times daily.    . traZODone (DESYREL) 50 MG tablet TAKE 1 TO 3 TABLETS BY MOUTH AT BEDTIME 270 tablet 1  . vitamin B-12 (CYANOCOBALAMIN) 1000 MCG tablet Take by mouth.    . Vitamin D, Ergocalciferol, (DRISDOL) 1.25 MG (50000 UNIT) CAPS capsule TAKE 1 CAPSULE (50,000 UNITS TOTAL) BY MOUTH EVERY 7 (SEVEN) DAYS. 4 capsule 3  . [START ON 07/13/2020] lisdexamfetamine ([name]) 70 MG capsule Take 1 capsule (70 mg total) by mouth daily. 30 capsule 0  . [START ON 08/10/2020] lisdexamfetamine ([name]) 70 MG capsule Take 1 capsule (70 mg total) by mouth daily. 30 capsule 0   No current facility-administered medications for this visit.    Medication  Side Effects: None  Tremor not gone but less  Allergies: No Known Allergies  Past Medical History:  Diagnosis Date  . ADHD   . Allergy   . Anxiety   . Bipolar 2 disorder ([place])   . Depression   . Diabetes mellitus without complication ([place])   . GERD (gastroesophageal reflux disease)   . Hyperlipidemia    diet controlled  . Sleep apnea    uses CPAP nightly  . Thyroid disease     Family History  Problem Relation Age of Onset  . Asthma Father   . Emphysema Father   . Heart disease Paternal Grandfather   . Heart disease Paternal Grandmother   . Breast cancer Maternal Grandmother   . Colon cancer Maternal Grandfather   . Rectal cancer Neg Hx   . Stomach cancer Neg Hx   . Esophageal cancer Neg Hx     Social History   Socioeconomic History  . Marital status: Married    Spouse name: Not on file  . Number of children: 1  . Years of education: Not on file  . Highest education level: Not on file  Occupational History  . Occupation: Pharmacologist  Tobacco Use  . Smoking status: Former Smoker    Packs/day: 1.00    Years: 7.00    Pack years: 7.00    Types: Cigarettes     Quit date: 07/11/1993    Years since quitting: 26.9  . Smokeless tobacco: Never Used  Vaping Use  . Vaping Use: Never used  Substance and Sexual Activity  . Alcohol use: Yes    Alcohol/week: 4.0 standard drinks    Types: 4 Standard drinks or equivalent per week  . Drug use: No  . Sexual activity: Not Currently  Other Topics Concern  . Not on file  Social History Narrative  . Not on file   Social Determinants of Health   Financial Resource Strain:   . Difficulty of Paying Living Expenses: Not on file  Food Insecurity:   . Worried About Charity fundraiser in the Last Year: Not on file  . Ran Out of Food in the Last Year: Not on file  Transportation Needs:   . Lack of Transportation (Medical): Not on file  . Lack of Transportation (Non-Medical): Not on file  Physical Activity:   . Days of Exercise per Week: Not on file  . Minutes of Exercise per Session: Not on file  Stress:   . Feeling of Stress : Not on file  Social Connections:   . Frequency of Communication with Friends and Family: Not on file  . Frequency of Social Gatherings with Friends and Family: Not on file  . Attends Religious Services: Not on file  . Active Member of Clubs or Organizations: Not on file  . Attends Archivist Meetings: Not on file  . Marital Status: Not on file  Intimate Partner Violence:   . Fear of Current or Ex-Partner: Not on file  . Emotionally Abused: Not on file  . Physically Abused: Not on file  . Sexually Abused: Not on file    Past Medical History, Surgical history, Social history, and Family history were reviewed and updated as appropriate.   Son sam.  Please see review of systems for further details on the patient's review from today.   Objective:   Physical Exam:  There were no vitals taken for this visit.  Physical Exam Constitutional:      General: He is not in  acute distress.    Appearance: Normal appearance.  Musculoskeletal:        General: No  deformity.  Neurological:     Mental Status: He is alert and oriented to person, place, and time.     Cranial Nerves: No dysarthria.     Coordination: Coordination normal.  Psychiatric:        Attention and Perception: Attention and perception normal. He does not perceive auditory or visual hallucinations.        Mood and Affect: Mood is anxious. Mood is not depressed. Affect is not labile, blunt, angry or inappropriate.        Speech: Speech normal.        Behavior: Behavior normal. Behavior is cooperative.        Thought Content: Thought content normal. Thought content is not paranoid or delusional. Thought content does not include homicidal or suicidal ideation. Thought content does not include homicidal or suicidal plan.        Cognition and Memory: Cognition and memory normal.        Judgment: Judgment normal.     Comments: Insight intact     Lab Review:     Component Value Date/Time   NA 141 08/26/2016 1959   K 4.0 08/26/2016 1959   CL 112 (H) 08/26/2016 1959   CO2 24 08/26/2016 1959   GLUCOSE 106 (H) 08/26/2016 1959   BUN 19 08/26/2016 1959   CREATININE 1.06 08/26/2016 1959   CALCIUM 8.8 (L) 08/26/2016 1959   PROT 6.7 08/26/2016 1959   ALBUMIN 3.8 08/26/2016 1959   AST 18 08/26/2016 1959   ALT 21 08/26/2016 1959   ALKPHOS 60 08/26/2016 1959   BILITOT 0.6 08/26/2016 1959   GFRNONAA >60 08/26/2016 1959   GFRAA >60 08/26/2016 1959       Component Value Date/Time   WBC 8.6 08/26/2016 1959   RBC 4.35 08/26/2016 1959   HGB 13.7 08/26/2016 1959   HCT 39.8 08/26/2016 1959   PLT 221 08/26/2016 1959   MCV 91.5 08/26/2016 1959   MCH 31.5 08/26/2016 1959   MCHC 34.4 08/26/2016 1959   RDW 12.6 08/26/2016 1959   LYMPHSABS 2.4 08/26/2016 1959   MONOABS 0.4 08/26/2016 1959   EOSABS 0.2 08/26/2016 1959   BASOSABS 0.0 08/26/2016 1959    Lithium Lvl  Date Value Ref Range Status  04/02/2018 0.7 0.6 - 1.2 mmol/L Final     Lab Results  Component Value Date   VALPROATE  54.6 04/02/2018    TSH at 3   Normal ammonia 47 in 2019.  .res Assessment: Plan:    Bipolar I disorder with depression ([place]) - Plan: cariprazine (VRAYLAR) capsule, divalproex (DEPAKOTE ER) 500 MG 24 hr tablet, liothyronine (CYTOMEL) 25 MCG tablet, lithium carbonate (LITHOBID) 300 MG CR tablet  Attention deficit hyperactivity disorder (ADHD), combined type - Plan: lisdexamfetamine ([name]) 70 MG capsule, lisdexamfetamine ([name]) 70 MG capsule, lisdexamfetamine ([name]) 70 MG capsule  Generalized anxiety disorder  Restless legs syndrome  Insomnia due to mental condition  Obstructive sleep apnea  Low vitamin D level  Lithium use   Greater than 50% of face to face time with patient was spent on counseling and coordination of care. 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  His symptoms have been severe and very resistant to any benefit.    He cannot tolerate an increase in the Depakote based on prior ataxic experiences  at 1500 mg daily.  Higher doses of lithium have not helped anymore.  He has failed multiple medications and ECT and [place]. HowEver the flatness is much improved with the reduction in in Vraylar to 1.5 mg every other day.  He has had no worsening anxiety, mood swings nor depression.  He was not having extreme cycling like he did in the past.  We previously had reduced lithium to 600 mg daily and reduce Depakote to 500 mg daily so those were not changed. Overall his mood is doing pretty well.  He has been less compliant with his CPAP lately due to gagging problem to be evaluatied by ENT in Jan. If excessive gagging is somehow connected to anxiety or if we need to try anxiety meds to help he will let us know. Marland Kitchen RLS managed.  Pramipexole prn for RLS this is managed at this time.  Continue vitamin D check level later.  Continue B12. Extensive discussions about  possible supplements that could be helpful.  He will continue the following. B-Complex 150 Still rec N-Acetylcysteine at 1000-1200 mg daily to help with mild cognitive problems and depression.  It can be combined with a B-complex vitamin as the B-12 and folate have been shown to sometimes enhance the effect. L-methylfolate 15 mg daily for depression.  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  Free T4 0.7 T3 158.9 TSH 0. 01 Therefore at the last visit we reduced Cytomel to 37.5 mcg every morning.  Cytomel is being used as a depression augmentation strategy.  He had no problems with this and will consider further reduction.  He will see Dr. Concha Pyo soon who is likely to order follow-up lab tests Extensive discussion of various thyroid lab values and use of thyroid meds in depression.   He's seeing Endo next week.  Disc this.  Discussed his polypharmacy which is not ideal but has been necessary to control the mood cycling and now his depression has improved.  Plus he also has ADD that has to be managed.  He is satisfied with his ADD management with [name].  Discussed potential change to a new therapist.  Better with Reduced Vraylar re: flatness is better.  1.5 mg every other day.  No med changes today  FU 4 months.    Lynder Parents, MD, DFAPA   Please see After Visit Summary for patient specific instructions.  Future Appointments  Date Time Provider [place]  10/15/2020 10:30 AM Cottle, Billey Co., MD [facility] None    No orders of the defined types were placed in this encounter.     -------------------------------

## 2020-06-16 ENCOUNTER — Telehealth: Payer: Self-pay

## 2020-06-16 ENCOUNTER — Other Ambulatory Visit: Payer: Self-pay

## 2020-06-16 DIAGNOSIS — R6889 Other general symptoms and signs: Secondary | ICD-10-CM

## 2020-06-16 DIAGNOSIS — R0989 Other specified symptoms and signs involving the circulatory and respiratory systems: Secondary | ICD-10-CM

## 2020-06-16 DIAGNOSIS — J392 Other diseases of pharynx: Secondary | ICD-10-CM

## 2020-06-16 NOTE — Telephone Encounter (Signed)
  Follow up Call-  Call back number 06/12/2020 04/04/2019  Post procedure Call Back phone  # [phone]  Permission to leave phone message Yes Yes  Some recent data might be hidden     Patient questions:  Do you have a fever, pain , or abdominal swelling? No. Pain Score  0 *  Have you tolerated food without any problems? Yes.    Have you been able to return to your normal activities? Yes.    Do you have any questions about your discharge instructions: Diet   No. Medications  No. Follow up visit  No.  Do you have questions or concerns about your Care? No.  Actions: * If pain score is 4 or above: No action needed, pain <4.  1. Have you developed a fever since your procedure? no  2.   Have you had an respiratory symptoms (SOB or cough) since your procedure? no  3.   Have you tested positive for COVID 19 since your procedure no  4.   Have you had any family members/close contacts diagnosed with the COVID 19 since your procedure?  no   If yes to any of these questions please route to Joylene John, RN and Joella Prince, RN

## 2020-06-16 NOTE — Telephone Encounter (Signed)
Per 06/12/20 procedure note - recommend ENT evaluation for strong gag reflex/throat clearing, less likely caused by reflux.  Ambulatory referral to ENT in epic.

## 2020-06-18 ENCOUNTER — Encounter: Payer: Self-pay | Admitting: Gastroenterology

## 2020-06-18 DIAGNOSIS — E039 Hypothyroidism, unspecified: Secondary | ICD-10-CM | POA: Diagnosis not present

## 2020-06-18 DIAGNOSIS — E049 Nontoxic goiter, unspecified: Secondary | ICD-10-CM | POA: Diagnosis not present

## 2020-06-18 DIAGNOSIS — F319 Bipolar disorder, unspecified: Secondary | ICD-10-CM | POA: Diagnosis not present

## 2020-06-18 DIAGNOSIS — K219 Gastro-esophageal reflux disease without esophagitis: Secondary | ICD-10-CM | POA: Diagnosis not present

## 2020-06-18 DIAGNOSIS — F900 Attention-deficit hyperactivity disorder, predominantly inattentive type: Secondary | ICD-10-CM | POA: Diagnosis not present

## 2020-06-18 DIAGNOSIS — F341 Dysthymic disorder: Secondary | ICD-10-CM | POA: Diagnosis not present

## 2020-06-19 ENCOUNTER — Other Ambulatory Visit: Payer: Self-pay | Admitting: Internal Medicine

## 2020-06-19 DIAGNOSIS — E049 Nontoxic goiter, unspecified: Secondary | ICD-10-CM

## 2020-06-25 DIAGNOSIS — F341 Dysthymic disorder: Secondary | ICD-10-CM | POA: Diagnosis not present

## 2020-06-25 DIAGNOSIS — F900 Attention-deficit hyperactivity disorder, predominantly inattentive type: Secondary | ICD-10-CM | POA: Diagnosis not present

## 2020-07-16 DIAGNOSIS — F900 Attention-deficit hyperactivity disorder, predominantly inattentive type: Secondary | ICD-10-CM | POA: Diagnosis not present

## 2020-07-16 DIAGNOSIS — F341 Dysthymic disorder: Secondary | ICD-10-CM | POA: Diagnosis not present

## 2020-07-23 ENCOUNTER — Ambulatory Visit
Admission: RE | Admit: 2020-07-23 | Discharge: 2020-07-23 | Disposition: A | Discharge status: Home / Self Care | Payer: BC Managed Care – PPO | Source: Ambulatory Visit | Attending: Internal Medicine | Admitting: Internal Medicine

## 2020-07-23 DIAGNOSIS — F341 Dysthymic disorder: Secondary | ICD-10-CM | POA: Diagnosis not present

## 2020-07-23 DIAGNOSIS — E049 Nontoxic goiter, unspecified: Secondary | ICD-10-CM | POA: Diagnosis not present

## 2020-07-23 DIAGNOSIS — F900 Attention-deficit hyperactivity disorder, predominantly inattentive type: Secondary | ICD-10-CM | POA: Diagnosis not present

## 2020-07-30 DIAGNOSIS — F900 Attention-deficit hyperactivity disorder, predominantly inattentive type: Secondary | ICD-10-CM | POA: Diagnosis not present

## 2020-07-30 DIAGNOSIS — F341 Dysthymic disorder: Secondary | ICD-10-CM | POA: Diagnosis not present

## 2020-08-06 DIAGNOSIS — F341 Dysthymic disorder: Secondary | ICD-10-CM | POA: Diagnosis not present

## 2020-08-06 DIAGNOSIS — F900 Attention-deficit hyperactivity disorder, predominantly inattentive type: Secondary | ICD-10-CM | POA: Diagnosis not present

## 2020-08-13 DIAGNOSIS — F341 Dysthymic disorder: Secondary | ICD-10-CM | POA: Diagnosis not present

## 2020-08-13 DIAGNOSIS — F900 Attention-deficit hyperactivity disorder, predominantly inattentive type: Secondary | ICD-10-CM | POA: Diagnosis not present

## 2020-08-20 DIAGNOSIS — F900 Attention-deficit hyperactivity disorder, predominantly inattentive type: Secondary | ICD-10-CM | POA: Diagnosis not present

## 2020-08-20 DIAGNOSIS — F341 Dysthymic disorder: Secondary | ICD-10-CM | POA: Diagnosis not present

## 2020-09-03 DIAGNOSIS — F341 Dysthymic disorder: Secondary | ICD-10-CM | POA: Diagnosis not present

## 2020-09-03 DIAGNOSIS — F900 Attention-deficit hyperactivity disorder, predominantly inattentive type: Secondary | ICD-10-CM | POA: Diagnosis not present

## 2020-09-10 DIAGNOSIS — F900 Attention-deficit hyperactivity disorder, predominantly inattentive type: Secondary | ICD-10-CM | POA: Diagnosis not present

## 2020-09-10 DIAGNOSIS — F341 Dysthymic disorder: Secondary | ICD-10-CM | POA: Diagnosis not present

## 2020-09-13 ENCOUNTER — Other Ambulatory Visit: Payer: Self-pay | Admitting: Psychiatry

## 2020-09-17 DIAGNOSIS — R059 Cough, unspecified: Secondary | ICD-10-CM | POA: Diagnosis not present

## 2020-09-17 DIAGNOSIS — Z20822 Contact with and (suspected) exposure to covid-19: Secondary | ICD-10-CM | POA: Diagnosis not present

## 2020-09-17 DIAGNOSIS — R0602 Shortness of breath: Secondary | ICD-10-CM | POA: Diagnosis not present

## 2020-09-21 ENCOUNTER — Telehealth: Payer: Self-pay | Admitting: Psychiatry

## 2020-09-21 ENCOUNTER — Other Ambulatory Visit: Payer: Self-pay | Admitting: Psychiatry

## 2020-09-21 DIAGNOSIS — F902 Attention-deficit hyperactivity disorder, combined type: Secondary | ICD-10-CM

## 2020-09-21 MED ORDER — LISDEXAMFETAMINE DIMESYLATE 70 MG PO CAPS: 70.0000 mg | ORAL_CAPSULE | Freq: Every day | ORAL | 0 refills | Status: DC

## 2020-09-21 NOTE — Telephone Encounter (Signed)
Next visit is 10/15/20. Requesting refill on [name] 70 mg called to   [id number] - [place], [place] - [address] [place] Phone:  [phone]  Fax:  [phone]

## 2020-09-24 DIAGNOSIS — F341 Dysthymic disorder: Secondary | ICD-10-CM | POA: Diagnosis not present

## 2020-09-24 DIAGNOSIS — F900 Attention-deficit hyperactivity disorder, predominantly inattentive type: Secondary | ICD-10-CM | POA: Diagnosis not present

## 2020-10-01 DIAGNOSIS — F341 Dysthymic disorder: Secondary | ICD-10-CM | POA: Diagnosis not present

## 2020-10-01 DIAGNOSIS — F900 Attention-deficit hyperactivity disorder, predominantly inattentive type: Secondary | ICD-10-CM | POA: Diagnosis not present

## 2020-10-08 DIAGNOSIS — F341 Dysthymic disorder: Secondary | ICD-10-CM | POA: Diagnosis not present

## 2020-10-08 DIAGNOSIS — F900 Attention-deficit hyperactivity disorder, predominantly inattentive type: Secondary | ICD-10-CM | POA: Diagnosis not present

## 2020-10-15 ENCOUNTER — Ambulatory Visit (INDEPENDENT_AMBULATORY_CARE_PROVIDER_SITE_OTHER): Payer: BC Managed Care – PPO | Admitting: Psychiatry

## 2020-10-15 ENCOUNTER — Encounter: Payer: Self-pay | Admitting: Psychiatry

## 2020-10-15 ENCOUNTER — Other Ambulatory Visit: Payer: Self-pay

## 2020-10-15 VITALS — BP 131/84 | HR 84

## 2020-10-15 DIAGNOSIS — F5105 Insomnia due to other mental disorder: Secondary | ICD-10-CM

## 2020-10-15 DIAGNOSIS — F411 Generalized anxiety disorder: Secondary | ICD-10-CM

## 2020-10-15 DIAGNOSIS — F319 Bipolar disorder, unspecified: Secondary | ICD-10-CM

## 2020-10-15 DIAGNOSIS — F902 Attention-deficit hyperactivity disorder, combined type: Secondary | ICD-10-CM | POA: Diagnosis not present

## 2020-10-15 DIAGNOSIS — G2581 Restless legs syndrome: Secondary | ICD-10-CM

## 2020-10-15 DIAGNOSIS — G4733 Obstructive sleep apnea (adult) (pediatric): Secondary | ICD-10-CM

## 2020-10-15 MED ORDER — CARIPRAZINE HCL 1.5 MG PO CAPS: 1.5000 mg | ORAL_CAPSULE | Freq: Every day | ORAL | 5 refills | Status: DC

## 2020-10-15 MED ORDER — L-METHYLFOLATE CALCIUM 15 MG PO TABS: 1.0000 | ORAL_TABLET | Freq: Every day | ORAL | 11 refills | Status: DC

## 2020-10-15 MED ORDER — LIOTHYRONINE SODIUM 25 MCG PO TABS: ORAL_TABLET | ORAL | 0 refills | Status: DC

## 2020-10-15 MED ORDER — TRAZODONE HCL 50 MG PO TABS: ORAL_TABLET | ORAL | 1 refills | Status: DC

## 2020-10-15 MED ORDER — LISDEXAMFETAMINE DIMESYLATE 70 MG PO CAPS: 70.0000 mg | ORAL_CAPSULE | Freq: Every day | ORAL | 0 refills | Status: DC

## 2020-10-15 MED ORDER — ALPRAZOLAM 0.5 MG PO TABS: 0.5000 mg | ORAL_TABLET | Freq: Every evening | ORAL | 2 refills | Status: DC | PRN

## 2020-10-15 NOTE — Progress Notes (Signed)
Zachary Dixon [id number] February 24, 1966 55 y.o.   Subjective:   Patient ID:  Zachary Dixon is a 55 y.o. (DOB Jan 29, 1966) male.  Chief Complaint:  Chief Complaint  Patient presents with  . Follow-up  . Bipolar I disorder with depression ([place])    Depression        Associated symptoms include fatigue.  Associated symptoms include no decreased concentration and no suicidal ideas.  Past medical history includes anxiety.   Anxiety Symptoms include nervous/anxious behavior. Patient reports no confusion, decreased concentration, nausea or suicidal ideas.    Medication Refill Associated symptoms include fatigue. Pertinent negatives include no nausea, sore throat or weakness.   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    at visit October 11, 2018.  Cytomel potentiation was started for depression 25 mcg daily to increase to 50 mcg daily.  In addition there were concerns about flatness and he was wondering if it is related to side effects versus depression.  We reduced the lithium temporarily to see if that was the cause of the flatness. Reductions in lithium and depakote did not help the flatness.  No noticeable effect from the Cytomel except it did help the thyroid panel.  At visit Dec 06, 2018.  [name] was switched to Mydayis to try to improve his function full before waking day. Felt it was less effective than [name].  Didn't feel it kick in and no longer duration at maximum 50 mg so returned to [name]. It's better.   Patient was  seen August 2020.  Because his TSH was suppressed we reduced Cytomel to 37.5 mcg daily.  Because of flatness we reduced the Vraylar to 1.5 mg daily to see if there could be improvement.  Patient last seen December.  No med changes were made.  08/28/19 seen with wife, Zachary Dixon Has been under more stress at work for 3 mos.  Not markedly depressed but falls asleep  easily in evening and gets tired easily.  Sleeping well with CPAP but may not get to bed early enough.  Falls asleep in chair and then gets to CPAP.  Not enough sleep on nights he has to travel.  Wife concerns.  Doesn't seem happy or interactive.  Used to have good days.  Can be irritable but no anger outbursts.  No mood swings.  No sense of humor which had been there before.  Still anxious and holding it in. We decided to reduce Vraylar to 1.5 mg every other day to see if flatness was improved.  Mood lability was under control.  As of 10/17/19:  Better.  Lighter and more energy.  Wife agrees.  Not depressed lately.  Not even small downturns.  Very even.   Zachary Dixon notices better engagement.  Anxiety still there with work.  Big projects coming to close in next month.  Everyone at work overwhelmed.  Would love to have another job.  She says he's always been stressed by every job. Plans to look elsewhere.   100% use status of CPAP. Compliant with meds.  No med concerns. Plan: Better with Reduced Vraylar re: flatness is better.  1.5 mg every other day. No med changes today  01/09/2020 appointment with the following noted: Consistent with meds.  Pretty good without complaints. Still has prominent gag reflex but has had it his whole life but seems worse than in years past.  Makes it even hard to wear  CPAP.  Not wearing it 8 hours but 5-6 hours now and 2-3 weeks ago wasn't wearing it.  Feels much better after not wearing it. Getting 8 hours sleep per night. Mood, anxiety and focus steady otherwise. No SE with meds. Plan Rec trial with NAC for cognition and mood.  Disc in detail  06/15/2020 appointment with the following noted: Nan dx breast CA, stage 1.  No chemo planned.  She's dealing with it pretty well. Work remained stressful.  Lost 3 people but trying to keep perspective with wife's health issues.  Still pursuing another job but little time and energy for that. Mood very good overall.  Primary thing is  stress and has to be careful with it, otherwise anxiety can spend out of control.  Sleeping well. Endoscopy OK in workup with gagging problem.  Gagging interferes with CPAP use.  Can be worse with stress.  Can be difficult to convince doctor's a problem. PCP Dr. Shelia Media is good.  Overreactive gag reflex.  But sometimes can occur out of sleep.  Plan no med changes except again recommend he try NAC for cognitive reasons as noted before.  10/15/2020 appointment with following noted: Zachary Dixon doing well with treatment and finished tx. Still doing therapy.  Some struggle with depression.  Has interests and not as flat as years ago. Really tired and exhausted.  Clearly some related to job and searching. Still deals with gag reflex and sleep mask and sees ENT next week.  Never had problems with claustrophobia but problems with turtle necks, dentist, CPAP now .  Barely wear the CPAP.  Doing DBT.  But not following through with all of it.  Past psychiatric med medication trials include failure of ECT and TMS,  Latuda 120 mg,  aripiprazole, olanzapine 20 mg, Symbyax Seroquel, Rexulti, Vraylar 3 pramipexole,  lamotrigine, , Depakote 1500 ataxia,  lithium at high blood levels,  Pristiq, sertraline, bupropion 450 mg caused anxiety, Lexapro, Viibryd,  Deplin, Duloxetine, Trintellix, [name], Mydayis Concerta 72 mg,  Review of Systems:  Review of Systems  Constitutional: Positive for fatigue.  HENT: Negative for sore throat.   Gastrointestinal: Negative for nausea.       Gagging comes and goes ongoing  Neurological: Negative for tremors and weakness.  Psychiatric/Behavioral: Positive for depression. Negative for agitation, behavioral problems, confusion, decreased concentration, dysphoric mood, hallucinations, self-injury, sleep disturbance and suicidal ideas. The patient is nervous/anxious. The patient is not hyperactive.     Medications: I have reviewed the patient's current medications.  Current  Outpatient Medications  Medication Sig Dispense Refill  . cariprazine (VRAYLAR) capsule Take 1 capsule (1.5 mg total) by mouth daily. 30 capsule 2  . divalproex (DEPAKOTE ER) 500 MG 24 hr tablet Take 2 tablets (1,000 mg total) by mouth at bedtime. 180 tablet 1  . liothyronine (CYTOMEL) 25 MCG tablet 1 and 1/2 tablet each morning 135 tablet 0  . lisdexamfetamine ([name]) 70 MG capsule Take 1 capsule (70 mg total) by mouth daily. 30 capsule 0  . [START ON 10/19/2020] lisdexamfetamine ([name]) 70 MG capsule Take 1 capsule (70 mg total) by mouth daily. 30 capsule 0  . [START ON 11/16/2020] lisdexamfetamine ([name]) 70 MG capsule Take 1 capsule (70 mg total) by mouth daily. 30 capsule 0  . lithium carbonate (LITHOBID) 300 MG CR tablet Take 2 tablets (600 mg total) by mouth at bedtime. 180 tablet 1  . Omega-3 1000 MG CAPS Take by mouth.    . pantoprazole (PROTONIX) 40 MG tablet Take 40  mg by mouth 2 (two) times daily.    . traZODone (DESYREL) 50 MG tablet TAKE 1 TO 3 TABLETS BY MOUTH AT BEDTIME 270 tablet 1  . vitamin B-12 (CYANOCOBALAMIN) 1000 MCG tablet Take by mouth.    . Vitamin D, Ergocalciferol, (DRISDOL) 1.25 MG (50000 UNIT) CAPS capsule TAKE 1 CAPSULE (50,000 UNITS TOTAL) BY MOUTH EVERY 7 (SEVEN) DAYS. 4 capsule 3  . L-methylfolate Calcium 15 MG TABS TAKE 1 TABLET BY MOUTH EVERY DAY (Patient not taking: Reported on 10/15/2020) 30 tablet 11   No current facility-administered medications for this visit.    Medication Side Effects: None  Tremor not gone but less  Allergies: No Known Allergies  Past Medical History:  Diagnosis Date  . ADHD   . Allergy   . Anxiety   . Bipolar 2 disorder ([place])   . Depression   . Diabetes mellitus without complication ([place])   . GERD (gastroesophageal reflux disease)   . Hyperlipidemia    diet controlled  . Sleep apnea    uses CPAP nightly  . Thyroid disease     Family History  Problem Relation Age of Onset  . Asthma Father   . Emphysema Father   .  Heart disease Paternal Grandfather   . Heart disease Paternal Grandmother   . Breast cancer Maternal Grandmother   . Colon cancer Maternal Grandfather   . Rectal cancer Neg Hx   . Stomach cancer Neg Hx   . Esophageal cancer Neg Hx     Social History   Socioeconomic History  . Marital status: Married    Spouse name: Not on file  . Number of children: 1  . Years of education: Not on file  . Highest education level: Not on file  Occupational History  . Occupation: Pharmacologist  Tobacco Use  . Smoking status: Former Smoker    Packs/day: 1.00    Years: 7.00    Pack years: 7.00    Types: Cigarettes    Quit date: 07/11/1993    Years since quitting: 27.2  . Smokeless tobacco: Never Used  Vaping Use  . Vaping Use: Never used  Substance and Sexual Activity  . Alcohol use: Yes    Alcohol/week: 4.0 standard drinks    Types: 4 Standard drinks or equivalent per week  . Drug use: No  . Sexual activity: Not Currently  Other Topics Concern  . Not on file  Social History Narrative  . Not on file   Social Determinants of Health   Financial Resource Strain: Not on file  Food Insecurity: Not on file  Transportation Needs: Not on file  Physical Activity: Not on file  Stress: Not on file  Social Connections: Not on file  Intimate Partner Violence: Not on file    Past Medical History, Surgical history, Social history, and Family history were reviewed and updated as appropriate.   Son sam.  Please see review of systems for further details on the patient's review from today.   Objective:   Physical Exam:  BP 131/84   Pulse 84   Physical Exam Constitutional:      General: He is not in acute distress.    Appearance: Normal appearance.  Musculoskeletal:        General: No deformity.  Neurological:     Mental Status: He is alert and oriented to person, place, and time.     Cranial Nerves: No dysarthria.     Coordination: Coordination normal.  Psychiatric:  Attention and  Perception: Attention and perception normal. He does not perceive auditory or visual hallucinations.        Mood and Affect: Mood is anxious. Mood is not depressed. Affect is not labile, blunt, angry or inappropriate.        Speech: Speech normal.        Behavior: Behavior normal. Behavior is cooperative.        Thought Content: Thought content normal. Thought content is not paranoid or delusional. Thought content does not include homicidal or suicidal ideation. Thought content does not include homicidal or suicidal plan.        Cognition and Memory: Cognition and memory normal.        Judgment: Judgment normal.     Comments: Insight intact     Lab Review:     Component Value Date/Time   NA 141 08/26/2016 1959   K 4.0 08/26/2016 1959   CL 112 (H) 08/26/2016 1959   CO2 24 08/26/2016 1959   GLUCOSE 106 (H) 08/26/2016 1959   BUN 19 08/26/2016 1959   CREATININE 1.06 08/26/2016 1959   CALCIUM 8.8 (L) 08/26/2016 1959   PROT 6.7 08/26/2016 1959   ALBUMIN 3.8 08/26/2016 1959   AST 18 08/26/2016 1959   ALT 21 08/26/2016 1959   ALKPHOS 60 08/26/2016 1959   BILITOT 0.6 08/26/2016 1959   GFRNONAA >60 08/26/2016 1959   GFRAA >60 08/26/2016 1959       Component Value Date/Time   WBC 8.6 08/26/2016 1959   RBC 4.35 08/26/2016 1959   HGB 13.7 08/26/2016 1959   HCT 39.8 08/26/2016 1959   PLT 221 08/26/2016 1959   MCV 91.5 08/26/2016 1959   MCH 31.5 08/26/2016 1959   MCHC 34.4 08/26/2016 1959   RDW 12.6 08/26/2016 1959   LYMPHSABS 2.4 08/26/2016 1959   MONOABS 0.4 08/26/2016 1959   EOSABS 0.2 08/26/2016 1959   BASOSABS 0.0 08/26/2016 1959    Lithium Lvl  Date Value Ref Range Status  04/02/2018 0.7 0.6 - 1.2 mmol/L Final     Lab Results  Component Value Date   VALPROATE 54.6 04/02/2018    TSH at 3   Normal ammonia 47 in 2019.  .res Assessment: Plan:    Bipolar I disorder with depression ([place])  Attention deficit hyperactivity disorder (ADHD), combined type  Generalized  anxiety disorder  Restless legs syndrome  Insomnia due to mental condition  Obstructive sleep apnea   Greater than 50% of face to face time with patient was spent on counseling and coordination of care. 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  His symptoms have been severe and very resistant to any benefit.    He cannot tolerate an increase in the Depakote based on prior ataxic experiences at 1500 mg daily.  Higher doses of lithium have not helped anymore.  He has failed multiple medications and ECT and [place]. HowEver the flatness is much improved with the reduction in in Vraylar to 1.5 mg every other day.  He has had no worsening anxiety, mood swings nor depression.  He was not having extreme cycling like he did in the past.  We previously had reduced lithium to 600 mg daily and reduce Depakote to 500 mg daily so those were not changed. Overall his mood is doing pretty well.  He has been less compliant with his CPAP lately due to gagging problem to be evaluatied by  ENT in Jan. Will try Bz to see if can stop the gag response so he can use the CPAP.   Xanax 0.5 mg HS No indication that psych meds causing tiredness. Disc Inspire. Marland Kitchen RLS managed.  Pramipexole prn for RLS this is managed at this time.  Continue vitamin D check level later.  Continue B12. Extensive discussions about possible supplements that could be helpful.  He will continue the following. B-Complex 150 Still rec N-Acetylcysteine at 1000-1200 mg daily to help with mild cognitive problems and depression.  It can be combined with a B-complex vitamin as the B-12 and folate have been shown to sometimes enhance the effect. L-methylfolate 15 mg daily for depression.  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  TSH 4.8 last one. Recent visit we reduced Cytomel to 37.5 mcg every morning.  Cytomel is  being used as a depression augmentation strategy.  He had no problems with this and will consider further reduction.  Extensive discussion of various thyroid lab values and use of thyroid meds in depression.     Disc this.  Discussed his polypharmacy which is not ideal but has been necessary to control the mood cycling and now his depression has improved.  Plus he also has ADD that has to be managed.  He is satisfied with his ADD management with [name].  Discussed potential change to a new therapist.  Better with Reduced Vraylar re: flatness is better.  1.5 mg every other day.  FU 4 months.    Lynder Parents, MD, DFAPA   Please see After Visit Summary for patient specific instructions.  Future Appointments  Date Time Provider [place]  10/27/2020  3:45 PM Kozlow, Donnamarie Poag, MD [facility] None    No orders of the defined types were placed in this encounter.     -------------------------------

## 2020-10-22 DIAGNOSIS — F341 Dysthymic disorder: Secondary | ICD-10-CM | POA: Diagnosis not present

## 2020-10-22 DIAGNOSIS — F900 Attention-deficit hyperactivity disorder, predominantly inattentive type: Secondary | ICD-10-CM | POA: Diagnosis not present

## 2020-10-27 ENCOUNTER — Encounter: Payer: Self-pay | Admitting: Allergy and Immunology

## 2020-10-27 ENCOUNTER — Other Ambulatory Visit: Payer: Self-pay

## 2020-10-27 ENCOUNTER — Ambulatory Visit: Payer: Self-pay | Admitting: Allergy and Immunology

## 2020-10-27 ENCOUNTER — Ambulatory Visit (INDEPENDENT_AMBULATORY_CARE_PROVIDER_SITE_OTHER): Payer: BC Managed Care – PPO | Admitting: Allergy and Immunology

## 2020-10-27 VITALS — BP 118/70 | HR 87 | Temp 98.0°F | Resp 16 | Ht 73.0 in | Wt 226.0 lb

## 2020-10-27 DIAGNOSIS — G4733 Obstructive sleep apnea (adult) (pediatric): Secondary | ICD-10-CM

## 2020-10-27 DIAGNOSIS — R198 Other specified symptoms and signs involving the digestive system and abdomen: Secondary | ICD-10-CM | POA: Diagnosis not present

## 2020-10-27 NOTE — Patient Instructions (Signed)
  1.  Continue pantoprazole 40 mg - 1 tablet twice a day  2.  Start famotidine 40 mg - 1 tablet in evening  3.  Buffer distilled water with quarter teaspoon baking soda plus quarter teaspoon noniodine salt in 8 ounces for CPAP  4.  Contact clinic in 1 week with update concerning response to this approach

## 2020-10-27 NOTE — Progress Notes (Signed)
[facility]   Follow-up Note  Referring Provider: Deland Pretty, MD Primary Provider: Deland Pretty, MD Date of Office Visit: 10/27/2020  Subjective:   Zachary Dixon (DOB: May 26, 1966) is a 55 y.o. male who returns to the [facility] on 10/27/2020 in re-evaluation of the following:  HPI: Zachary Dixon returns to this clinic in evaluation of a problem with his CPAP use.  Apparently over the course of the past year he has developed a conditioned response where he starts to gag whenever he puts the CPAP mask on.  He has always had a very strong gag reflex When he places the mask on his face within seconds to minutes he will start his gagging and needs to remove the mask.  He does have a history of reflux disease for which he uses pantoprazole twice a day and he minimizes his caffeine consumption to a coffee in the morning and on occasion a tea at lunchtime.  He does take lithium and Depakote and trazodone at nighttime before bed but he has been taking these medications for years and there is not really been a significant change in his medications.  He was given a sample of Xanax to use by his psychiatrist regarding treatment of this issue but that did not help him at all.  Allergies as of 10/27/2020   No Known Allergies     Medication List    cariprazine capsule Commonly known as: Vraylar Take 1 capsule (1.5 mg total) by mouth daily.   divalproex 500 MG 24 hr tablet Commonly known as: DEPAKOTE ER Take 2 tablets (1,000 mg total) by mouth at bedtime.   LEVOTHYROXINE SODIUM PO Take by mouth.   liothyronine 25 MCG tablet Commonly known as: CYTOMEL 1 and 1/2 tablet each morning   lisdexamfetamine 70 MG capsule Commonly known as: [name] Take 1 capsule (70 mg total) by mouth daily.   lithium carbonate 300 MG CR tablet Commonly known as: LITHOBID Take 2 tablets (600 mg total) by mouth at bedtime.   pantoprazole 40 MG  tablet Commonly known as: PROTONIX Take 40 mg by mouth 2 (two) times daily.   traZODone 50 MG tablet Commonly known as: DESYREL TAKE 1 TO 3 TABLETS BY MOUTH AT BEDTIME   Vitamin D (Ergocalciferol) 1.25 MG (50000 UNIT) Caps capsule Commonly known as: DRISDOL TAKE 1 CAPSULE (50,000 UNITS TOTAL) BY MOUTH EVERY 7 (SEVEN) DAYS.       Past Medical History:  Diagnosis Date  . ADHD   . Allergy   . Anxiety   . Bipolar 2 disorder ([place])   . Depression   . Diabetes mellitus without complication ([place])   . GERD (gastroesophageal reflux disease)   . Hyperlipidemia    diet controlled  . Sleep apnea    uses CPAP nightly  . Thyroid disease     Past Surgical History:  Procedure Laterality Date  . COLONOSCOPY  2019  . ELECTROCONVULSIVE THERAPY  2018   at [address] of systems negative except as noted in HPI / PMHx or noted below:  Review of Systems  Constitutional: Negative.   HENT: Negative.   Eyes: Negative.   Respiratory: Negative.   Cardiovascular: Negative.   Gastrointestinal: Negative.   Genitourinary: Negative.   Musculoskeletal: Negative.   Skin: Negative.   Neurological: Negative.   Endo/Heme/Allergies: Negative.   Psychiatric/Behavioral: Negative.      Objective:  Vitals:   10/27/20 1608  BP: 118/70  Pulse: 87  Resp: 16  Temp: 98 F (36.7 C)  SpO2: 97%   Height: 6\' 1"  (185.4 cm)  Weight: 226 lb (102.5 kg)   Physical Exam Constitutional:      Appearance: He is not diaphoretic.  HENT:     Head: Normocephalic.     Right Ear: Tympanic membrane, ear canal and external ear normal.     Left Ear: Tympanic membrane, ear canal and external ear normal.     Nose: Nose normal. No mucosal edema or rhinorrhea.     Mouth/Throat:     Pharynx: Uvula midline. No oropharyngeal exudate.  Eyes:     Conjunctiva/sclera: Conjunctivae normal.  Neck:     Thyroid: No thyromegaly.     Trachea: Trachea normal. No tracheal  tenderness or tracheal deviation.  Cardiovascular:     Rate and Rhythm: Normal rate and regular rhythm.     Heart sounds: Normal heart sounds, S1 normal and S2 normal. No murmur heard.   Pulmonary:     Effort: No respiratory distress.     Breath sounds: Normal breath sounds. No stridor. No wheezing or rales.  Lymphadenopathy:     Head:     Right side of head: No tonsillar adenopathy.     Left side of head: No tonsillar adenopathy.     Cervical: No cervical adenopathy.  Skin:    Findings: No erythema or rash.     Nails: There is no clubbing.  Neurological:     Mental Status: He is alert.     Diagnostics: none  Assessment and Plan:   1. Gagging episode   2. OSA (obstructive sleep apnea)     1.  Continue pantoprazole 40 mg - 1 tablet twice a day  2.  Start famotidine 40 mg - 1 tablet in evening  3.  Buffer distilled water with quarter teaspoon baking soda plus quarter teaspoon noniodine salt in 8 ounces for CPAP  4.  Contact clinic in 1 week with update concerning response to this approach  Zachary Dixon unfortunately has developed a condition response to the experience of placing the CPAP machine on his face with the development of very significant gagging.  We will try to change his experience of using the CPAP machine by buffering his distilled water hydration and will cover reflux with the therapy noted above.  I have asked him to contact me in a week noting his response to this approach.  Allena Katz, MD Allergy / Immunology [place]

## 2020-10-28 ENCOUNTER — Encounter: Payer: Self-pay | Admitting: Allergy and Immunology

## 2020-10-28 MED ORDER — FAMOTIDINE 40 MG PO TABS: 40.0000 mg | ORAL_TABLET | Freq: Every evening | ORAL | 5 refills | Status: DC

## 2020-11-05 ENCOUNTER — Other Ambulatory Visit: Payer: Self-pay | Admitting: Internal Medicine

## 2020-11-05 ENCOUNTER — Ambulatory Visit
Admission: RE | Admit: 2020-11-05 | Discharge: 2020-11-05 | Disposition: A | Discharge status: Home / Self Care | Payer: BC Managed Care – PPO | Source: Ambulatory Visit | Attending: Internal Medicine | Admitting: Internal Medicine

## 2020-11-05 DIAGNOSIS — R0789 Other chest pain: Secondary | ICD-10-CM

## 2020-11-05 DIAGNOSIS — R911 Solitary pulmonary nodule: Secondary | ICD-10-CM | POA: Diagnosis not present

## 2020-11-05 DIAGNOSIS — R062 Wheezing: Secondary | ICD-10-CM | POA: Diagnosis not present

## 2020-11-05 DIAGNOSIS — R6889 Other general symptoms and signs: Secondary | ICD-10-CM | POA: Diagnosis not present

## 2020-11-05 DIAGNOSIS — E039 Hypothyroidism, unspecified: Secondary | ICD-10-CM | POA: Diagnosis not present

## 2020-11-05 DIAGNOSIS — I700 Atherosclerosis of aorta: Secondary | ICD-10-CM | POA: Diagnosis not present

## 2020-11-05 DIAGNOSIS — J309 Allergic rhinitis, unspecified: Secondary | ICD-10-CM | POA: Diagnosis not present

## 2020-11-05 DIAGNOSIS — F900 Attention-deficit hyperactivity disorder, predominantly inattentive type: Secondary | ICD-10-CM | POA: Diagnosis not present

## 2020-11-05 DIAGNOSIS — R059 Cough, unspecified: Secondary | ICD-10-CM | POA: Diagnosis not present

## 2020-11-05 DIAGNOSIS — R918 Other nonspecific abnormal finding of lung field: Secondary | ICD-10-CM | POA: Diagnosis not present

## 2020-11-05 DIAGNOSIS — F341 Dysthymic disorder: Secondary | ICD-10-CM | POA: Diagnosis not present

## 2020-11-05 DIAGNOSIS — R5383 Other fatigue: Secondary | ICD-10-CM | POA: Diagnosis not present

## 2020-11-05 MED ORDER — IOPAMIDOL (ISOVUE-370) INJECTION 76%
75.0000 mL | Freq: Once | INTRAVENOUS | Status: AC | PRN
  Administered 2020-11-05: 75 mL via INTRAVENOUS

## 2020-11-10 DIAGNOSIS — D72829 Elevated white blood cell count, unspecified: Secondary | ICD-10-CM | POA: Diagnosis not present

## 2020-11-10 DIAGNOSIS — J189 Pneumonia, unspecified organism: Secondary | ICD-10-CM | POA: Diagnosis not present

## 2020-11-11 DIAGNOSIS — Z0000 Encounter for general adult medical examination without abnormal findings: Secondary | ICD-10-CM | POA: Diagnosis not present

## 2020-11-12 DIAGNOSIS — F341 Dysthymic disorder: Secondary | ICD-10-CM | POA: Diagnosis not present

## 2020-11-12 DIAGNOSIS — F900 Attention-deficit hyperactivity disorder, predominantly inattentive type: Secondary | ICD-10-CM | POA: Diagnosis not present

## 2020-11-17 ENCOUNTER — Other Ambulatory Visit: Payer: Self-pay | Admitting: Psychiatry

## 2020-11-17 DIAGNOSIS — F319 Bipolar disorder, unspecified: Secondary | ICD-10-CM

## 2020-11-17 DIAGNOSIS — J189 Pneumonia, unspecified organism: Secondary | ICD-10-CM | POA: Diagnosis not present

## 2020-11-19 DIAGNOSIS — F341 Dysthymic disorder: Secondary | ICD-10-CM | POA: Diagnosis not present

## 2020-11-19 DIAGNOSIS — F900 Attention-deficit hyperactivity disorder, predominantly inattentive type: Secondary | ICD-10-CM | POA: Diagnosis not present

## 2020-11-26 ENCOUNTER — Ambulatory Visit: Payer: BC Managed Care – PPO | Admitting: Allergy

## 2020-12-03 DIAGNOSIS — F900 Attention-deficit hyperactivity disorder, predominantly inattentive type: Secondary | ICD-10-CM | POA: Diagnosis not present

## 2020-12-03 DIAGNOSIS — F341 Dysthymic disorder: Secondary | ICD-10-CM | POA: Diagnosis not present

## 2020-12-16 ENCOUNTER — Other Ambulatory Visit: Payer: Self-pay | Admitting: Internal Medicine

## 2020-12-16 DIAGNOSIS — J189 Pneumonia, unspecified organism: Secondary | ICD-10-CM

## 2020-12-17 DIAGNOSIS — F341 Dysthymic disorder: Secondary | ICD-10-CM | POA: Diagnosis not present

## 2020-12-17 DIAGNOSIS — F900 Attention-deficit hyperactivity disorder, predominantly inattentive type: Secondary | ICD-10-CM | POA: Diagnosis not present

## 2020-12-30 DIAGNOSIS — F341 Dysthymic disorder: Secondary | ICD-10-CM | POA: Diagnosis not present

## 2020-12-30 DIAGNOSIS — F900 Attention-deficit hyperactivity disorder, predominantly inattentive type: Secondary | ICD-10-CM | POA: Diagnosis not present

## 2021-01-07 DIAGNOSIS — F900 Attention-deficit hyperactivity disorder, predominantly inattentive type: Secondary | ICD-10-CM | POA: Diagnosis not present

## 2021-01-07 DIAGNOSIS — F341 Dysthymic disorder: Secondary | ICD-10-CM | POA: Diagnosis not present

## 2021-01-08 ENCOUNTER — Other Ambulatory Visit: Payer: Self-pay | Admitting: Psychiatry

## 2021-01-14 DIAGNOSIS — F900 Attention-deficit hyperactivity disorder, predominantly inattentive type: Secondary | ICD-10-CM | POA: Diagnosis not present

## 2021-01-14 DIAGNOSIS — F341 Dysthymic disorder: Secondary | ICD-10-CM | POA: Diagnosis not present

## 2021-01-17 DIAGNOSIS — Z20822 Contact with and (suspected) exposure to covid-19: Secondary | ICD-10-CM | POA: Diagnosis not present

## 2021-01-20 ENCOUNTER — Telehealth: Payer: Self-pay | Admitting: Psychiatry

## 2021-01-20 NOTE — Telephone Encounter (Signed)
Pt called and needs a refill on his vyvanse 70 mg. Pharmacy is [facility] at [address]

## 2021-01-21 ENCOUNTER — Other Ambulatory Visit: Payer: Self-pay

## 2021-01-21 DIAGNOSIS — F902 Attention-deficit hyperactivity disorder, combined type: Secondary | ICD-10-CM

## 2021-01-21 DIAGNOSIS — F341 Dysthymic disorder: Secondary | ICD-10-CM | POA: Diagnosis not present

## 2021-01-21 DIAGNOSIS — F900 Attention-deficit hyperactivity disorder, predominantly inattentive type: Secondary | ICD-10-CM | POA: Diagnosis not present

## 2021-01-21 MED ORDER — LISDEXAMFETAMINE DIMESYLATE 70 MG PO CAPS: 70.0000 mg | ORAL_CAPSULE | Freq: Every day | ORAL | 0 refills | Status: DC

## 2021-01-21 NOTE — Telephone Encounter (Signed)
Pended.

## 2021-01-28 DIAGNOSIS — F341 Dysthymic disorder: Secondary | ICD-10-CM | POA: Diagnosis not present

## 2021-01-28 DIAGNOSIS — F900 Attention-deficit hyperactivity disorder, predominantly inattentive type: Secondary | ICD-10-CM | POA: Diagnosis not present

## 2021-01-31 ENCOUNTER — Other Ambulatory Visit: Payer: Self-pay | Admitting: Psychiatry

## 2021-01-31 DIAGNOSIS — F319 Bipolar disorder, unspecified: Secondary | ICD-10-CM

## 2021-02-11 DIAGNOSIS — F900 Attention-deficit hyperactivity disorder, predominantly inattentive type: Secondary | ICD-10-CM | POA: Diagnosis not present

## 2021-02-11 DIAGNOSIS — F341 Dysthymic disorder: Secondary | ICD-10-CM | POA: Diagnosis not present

## 2021-02-16 DIAGNOSIS — F900 Attention-deficit hyperactivity disorder, predominantly inattentive type: Secondary | ICD-10-CM | POA: Diagnosis not present

## 2021-02-16 DIAGNOSIS — F341 Dysthymic disorder: Secondary | ICD-10-CM | POA: Diagnosis not present

## 2021-02-18 ENCOUNTER — Ambulatory Visit (INDEPENDENT_AMBULATORY_CARE_PROVIDER_SITE_OTHER): Payer: BC Managed Care – PPO | Admitting: Psychiatry

## 2021-02-18 ENCOUNTER — Other Ambulatory Visit: Payer: Self-pay

## 2021-02-18 ENCOUNTER — Encounter: Payer: Self-pay | Admitting: Psychiatry

## 2021-02-18 VITALS — BP 120/75 | HR 93

## 2021-02-18 DIAGNOSIS — G2581 Restless legs syndrome: Secondary | ICD-10-CM

## 2021-02-18 DIAGNOSIS — F411 Generalized anxiety disorder: Secondary | ICD-10-CM

## 2021-02-18 DIAGNOSIS — R7989 Other specified abnormal findings of blood chemistry: Secondary | ICD-10-CM

## 2021-02-18 DIAGNOSIS — F319 Bipolar disorder, unspecified: Secondary | ICD-10-CM | POA: Diagnosis not present

## 2021-02-18 DIAGNOSIS — G4733 Obstructive sleep apnea (adult) (pediatric): Secondary | ICD-10-CM

## 2021-02-18 DIAGNOSIS — F5105 Insomnia due to other mental disorder: Secondary | ICD-10-CM

## 2021-02-18 DIAGNOSIS — F902 Attention-deficit hyperactivity disorder, combined type: Secondary | ICD-10-CM | POA: Diagnosis not present

## 2021-02-18 DIAGNOSIS — F40298 Other specified phobia: Secondary | ICD-10-CM

## 2021-02-18 DIAGNOSIS — Z79899 Other long term (current) drug therapy: Secondary | ICD-10-CM

## 2021-02-18 NOTE — Patient Instructions (Signed)
Go for [facility] consultation

## 2021-02-18 NOTE — Progress Notes (Signed)
Zachary Dixon [id number] Nov 22, 1965 55 y.o.   Subjective:   Patient ID:  Zachary Dixon is a 55 y.o. (DOB July 06, 1966) male.  Chief Complaint:  Chief Complaint  Patient presents with   Follow-up   Depression   Anxiety   ADHD    Depression        Associated symptoms include fatigue.  Associated symptoms include no decreased concentration and no suicidal ideas.  Past medical history includes anxiety.   Anxiety Symptoms include nervous/anxious behavior. Patient reports no confusion, decreased concentration, nausea or suicidal ideas.    Medication Refill Associated symptoms include fatigue. Pertinent negatives include no nausea, sore throat or weakness.  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    at visit October 11, 2018.  Cytomel potentiation was started for depression 25 mcg daily to increase to 50 mcg daily.  In addition there were concerns about flatness and he was wondering if it is related to side effects versus depression.  We reduced the lithium temporarily to see if that was the cause of the flatness. Reductions in lithium and depakote did not help the flatness.  No noticeable effect from the Cytomel except it did help the thyroid panel.  At visit Dec 06, 2018.  [name] was switched to Mydayis to try to improve his function full before waking day. Felt it was less effective than [name].  Didn't feel it kick in and no longer duration at maximum 50 mg so returned to [name]. It's better.   Patient was  seen August 2020.  Because his TSH was suppressed we reduced Cytomel to 37.5 mcg daily.  Because of flatness we reduced the Vraylar to 1.5 mg daily to see if there could be improvement.  Patient seen December 2020.  No med changes were made.  08/28/19 seen with wife, Zachary Dixon Has been under more stress at work for 3 mos.  Not markedly depressed but falls asleep easily in evening  and gets tired easily.  Sleeping well with CPAP but may not get to bed early enough.  Falls asleep in chair and then gets to CPAP.  Not enough sleep on nights he has to travel.  Wife concerns.  Doesn't seem happy or interactive.  Used to have good days.  Can be irritable but no anger outbursts.  No mood swings.  No sense of humor which had been there before.  Still anxious and holding it in. We decided to reduce Vraylar to 1.5 mg every other day to see if flatness was improved.  Mood lability was under control.  As of 10/17/19:  Better.  Lighter and more energy.  Wife agrees.  Not depressed lately.  Not even small downturns.  Very even.   Zachary Dixon notices better engagement.  Anxiety still there with work.  Big projects coming to close in next month.  Everyone at work overwhelmed.  Would love to have another job.  She says he's always been stressed by every job. Plans to look elsewhere.   100% use status of CPAP. Compliant with meds.  No med concerns. Plan: Better with Reduced Vraylar re: flatness is better.  1.5 mg every other day. No med changes today  01/09/2020 appointment with the following noted: Consistent with meds.  Pretty good without complaints. Still has prominent gag reflex but has had it his whole life but seems worse than in years past.  Makes it even hard to wear  CPAP.  Not wearing it 8 hours but 5-6 hours now and 2-3 weeks ago wasn't wearing it.  Feels much better after not wearing it. Getting 8 hours sleep per night. Mood, anxiety and focus steady otherwise. No SE with meds. Plan Rec trial with NAC for cognition and mood.  Disc in detail  06/15/2020 appointment with the following noted: Nan dx breast CA, stage 1.  No chemo planned.  She's dealing with it pretty well. Work remained stressful.  Lost 3 people but trying to keep perspective with wife's health issues.  Still pursuing another job but little time and energy for that. Mood very good overall.  Primary thing is stress and has to be  careful with it, otherwise anxiety can spend out of control.  Sleeping well. Endoscopy OK in workup with gagging problem.  Gagging interferes with CPAP use.  Can be worse with stress.  Can be difficult to convince doctor's a problem. PCP Dr. Shelia Media is good.  Overreactive gag reflex.  But sometimes can occur out of sleep.  Plan no med changes except again recommend he try NAC for cognitive reasons as noted before.  10/15/2020 appointment with following noted: Zachary Dixon doing well with treatment and finished tx. Still doing therapy.  Some struggle with depression.  Has interests and not as flat as years ago. Really tired and exhausted.  Clearly some related to job and searching. Still deals with gag reflex and sleep mask and sees ENT next week.  Never had problems with claustrophobia but problems with turtle necks, dentist, CPAP now .  Barely wear the CPAP. Doing DBT.  But not following through with all of it. Plan: No med changes Better with Reduced Vraylar re: flatness is better.  1.5 mg every other day.  02/18/2021 appointment with the following noted: Unsure if covid but had sx pneumonia a few mos ago with negative tests. Lately struggling and maybe bc not using CPAP bc mask phobic and still struggles with gagging even with Covid mask.  Has worked on it.  Has tried meditation to help the gagging. Very tired without [name].  Function at home affected by some more stress and conflict.  Chronic work stress may get better with changes and still searching for new job without success.  Spent after the work day.  Not present with family.  Zachary Dixon is upset.  Occ periods of despondency.   Couple weekends just walked off for 4 hours and didn't tell wife what he was doing and scared her.  Over did it. Can still enjoy and has interests.  Past psychiatric med medication trials include failure of ECT and TMS,  Latuda 120 mg,  aripiprazole, olanzapine 20 mg, Symbyax Seroquel, Rexulti, Vraylar 3 pramipexole,   lamotrigine, , Depakote 1500 ataxia,  lithium at high blood levels,  Pristiq, sertraline, bupropion 450 mg caused anxiety, Lexapro, Viibryd,  Deplin, Duloxetine, Trintellix, [name], Mydayis Concerta 72 mg, History of some DBT  Review of Systems:  Review of Systems  Constitutional:  Positive for fatigue.  HENT:  Negative for sore throat.        Still gagging  Gastrointestinal:  Negative for nausea.       Gagging comes and goes ongoing  Neurological:  Negative for tremors and weakness.  Psychiatric/Behavioral:  Positive for dysphoric mood. Negative for agitation, behavioral problems, confusion, decreased concentration, hallucinations, self-injury, sleep disturbance and suicidal ideas. The patient is nervous/anxious. The patient is not hyperactive.    Medications: I have reviewed the patient's current medications.  Current Outpatient Medications  Medication Sig Dispense Refill   cariprazine (VRAYLAR) capsule Take 1 capsule (1.5 mg total) by mouth daily. 30 capsule 5   divalproex (DEPAKOTE ER) 500 MG 24 hr tablet TAKE 2 TABLETS (1,000 MG TOTAL) BY MOUTH AT BEDTIME. 180 tablet 1   famotidine (PEPCID) 40 MG tablet Take 1 tablet (40 mg total) by mouth every evening. 30 tablet 5   liothyronine (CYTOMEL) 25 MCG tablet TAKE 1 & 1/2 TABLETS BY MOUTH EVERY MORNING 135 tablet 0   lisdexamfetamine ([name]) 70 MG capsule Take 1 capsule (70 mg total) by mouth daily. 30 capsule 0   lithium carbonate (LITHOBID) 300 MG CR tablet Take 2 tablets (600 mg total) by mouth at bedtime. 180 tablet 1   pantoprazole (PROTONIX) 40 MG tablet Take 40 mg by mouth 2 (two) times daily.     traZODone (DESYREL) 50 MG tablet TAKE 1 TO 3 TABLETS BY MOUTH AT BEDTIME 270 tablet 1   Vitamin D, Ergocalciferol, (DRISDOL) 1.25 MG (50000 UNIT) CAPS capsule TAKE 1 CAPSULE (50,000 UNITS TOTAL) BY MOUTH EVERY 7 (SEVEN) DAYS 4 capsule 3   LEVOTHYROXINE SODIUM PO Take by mouth. (Patient not taking: Reported on 02/18/2021)     No  current facility-administered medications for this visit.    Medication Side Effects: None  Tremor not gone but less  Allergies: No Known Allergies  Past Medical History:  Diagnosis Date   ADHD    Allergy    Anxiety    Bipolar 2 disorder ([place])    Depression    Diabetes mellitus without complication (HCC)    GERD (gastroesophageal reflux disease)    Hyperlipidemia    diet controlled   Sleep apnea    uses CPAP nightly   Thyroid disease     Family History  Problem Relation Age of Onset   Asthma Father    Emphysema Father    Heart disease Paternal Grandfather    Heart disease Paternal Grandmother    Breast cancer Maternal Grandmother    Colon cancer Maternal Grandfather    Rectal cancer Neg Hx    Stomach cancer Neg Hx    Esophageal cancer Neg Hx     Social History   Socioeconomic History   Marital status: Married    Spouse name: Not on file   Number of children: 1   Years of education: Not on file   Highest education level: Not on file  Occupational History   Occupation: marketing  Tobacco Use   Smoking status: Former    Packs/day: 1.00    Years: 7.00    Pack years: 7.00    Types: Cigarettes    Quit date: 07/11/1992    Years since quitting: 28.6   Smokeless tobacco: Never  Vaping Use   Vaping Use: Never used  Substance and Sexual Activity   Alcohol use: Yes    Alcohol/week: 4.0 standard drinks    Types: 4 Standard drinks or equivalent per week   Drug use: No   Sexual activity: Not Currently  Other Topics Concern   Not on file  Social History Narrative   Not on file   Social Determinants of Health   Financial Resource Strain: Not on file  Food Insecurity: Not on file  Transportation Needs: Not on file  Physical Activity: Not on file  Stress: Not on file  Social Connections: Not on file  Intimate Partner Violence: Not on file    Past Medical History, Surgical history, Social history, and Family  history were reviewed and updated as appropriate.    Son sam.  Please see review of systems for further details on the patient's review from today.   Objective:   Physical Exam:  BP 120/75   Pulse 93   Physical Exam Constitutional:      General: He is not in acute distress.    Appearance: Normal appearance.  Musculoskeletal:        General: No deformity.  Neurological:     Mental Status: He is alert and oriented to person, place, and time.     Cranial Nerves: No dysarthria.     Coordination: Coordination normal.  Psychiatric:        Attention and Perception: Attention and perception normal. He does not perceive auditory or visual hallucinations.        Mood and Affect: Mood is anxious. Mood is not depressed. Affect is not labile, blunt, angry or inappropriate.        Speech: Speech normal.        Behavior: Behavior normal. Behavior is cooperative.        Thought Content: Thought content normal. Thought content is not paranoid or delusional. Thought content does not include homicidal or suicidal ideation. Thought content does not include homicidal or suicidal plan.        Cognition and Memory: Cognition and memory normal.        Judgment: Judgment normal.     Comments: Insight intact Some increase in mood swings but brief    Lab Review:     Component Value Date/Time   NA 141 08/26/2016 1959   K 4.0 08/26/2016 1959   CL 112 (H) 08/26/2016 1959   CO2 24 08/26/2016 1959   GLUCOSE 106 (H) 08/26/2016 1959   BUN 19 08/26/2016 1959   CREATININE 1.06 08/26/2016 1959   CALCIUM 8.8 (L) 08/26/2016 1959   PROT 6.7 08/26/2016 1959   ALBUMIN 3.8 08/26/2016 1959   AST 18 08/26/2016 1959   ALT 21 08/26/2016 1959   ALKPHOS 60 08/26/2016 1959   BILITOT 0.6 08/26/2016 1959   GFRNONAA >60 08/26/2016 1959   GFRAA >60 08/26/2016 1959       Component Value Date/Time   WBC 8.6 08/26/2016 1959   RBC 4.35 08/26/2016 1959   HGB 13.7 08/26/2016 1959   HCT 39.8 08/26/2016 1959   PLT 221 08/26/2016 1959   MCV 91.5 08/26/2016 1959    MCH 31.5 08/26/2016 1959   MCHC 34.4 08/26/2016 1959   RDW 12.6 08/26/2016 1959   LYMPHSABS 2.4 08/26/2016 1959   MONOABS 0.4 08/26/2016 1959   EOSABS 0.2 08/26/2016 1959   BASOSABS 0.0 08/26/2016 1959    Lithium Lvl  Date Value Ref Range Status  04/02/2018 0.7 0.6 - 1.2 mmol/L Final     Lab Results  Component Value Date   VALPROATE 54.6 04/02/2018    TSH at 3   Normal ammonia 47 in 2019.  .res Assessment: Plan:    Bipolar I disorder with depression ([place])  Attention deficit hyperactivity disorder (ADHD), combined type  Generalized anxiety disorder  Obstructive sleep apnea  Insomnia due to mental condition  Restless legs syndrome  Low vitamin D level  Lithium use  Other specified phobia   Greater than 50% of face to face time with patient was spent on counseling and coordination of care. 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  His symptoms have been  severe and very resistant to any benefit.    He cannot tolerate an increase in the Depakote based on prior ataxic experiences at 1500 mg daily.  Higher doses of lithium have not helped anymore.  He has failed multiple medications and ECT and [place]. HowEver the flatness is much improved with the reduction in in Vraylar to 1.5 mg every other day.  He has had no worsening anxiety, mood swings nor depression.  Gagging appears phobic and  not med related.  Can occur before or after asleep. Med changes have not helped. Can experimenet with trazodone to see if it will help better with different doses.  He was not having extreme cycling like he did in the past.  We previously had reduced lithium to 600 mg daily and reduce Depakote to 500 mg daily so those were not changed. Overall his mood is worse a bit and may be due to reduced use of CPAP bc gagging.  He has been less and less compliant with his CPAP lately due to  gagging problem to be evaluatied by ENT in Jan.  More tired. Xanax 0.5 mg HS stopped bc didn't help gagging. No indication that psych meds causing tiredness. Disc [facility] rec look into it. Marland Kitchen RLS managed.  Pramipexole prn for RLS this is managed at this time.  Continue vitamin D check level later.  Continue B12. Extensive discussions about possible supplements that could be helpful.  He will continue the following. B-Complex 150 Still rec N-Acetylcysteine at 1000-1200 mg daily to help with mild cognitive problems and depression.  It can be combined with a B-complex vitamin as the B-12 and folate have been shown to sometimes enhance the effect. L-methylfolate 15 mg daily for depression.  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  TSH 4.8 last one. Recent visit we reduced Cytomel to 37.5 mcg every morning.  Cytomel is being used as a depression augmentation strategy.  He had no problems with this and will consider further reduction.  Extensive discussion of various thyroid lab values and use of thyroid meds in depression.     Disc this.  Discussed his polypharmacy which is not ideal but has been necessary to control the mood cycling and now his depression has improved.  Plus he also has ADD that has to be managed.  He is satisfied with his ADD management with [name].  Discussed working on work life balance bc problems there. Disc importance of communication with wife when he goes out instead of disappearing. Discussed safety plan at length with patient.  Advised patient to contact office with any worsening signs and symptoms.  Instructed patient to go to the [facility] emergency room for evaluation if experiencing any acute safety concerns, to include suicidal intent.  Better with Reduced Vraylar re: flatness is better.  1.5 mg every other day. No clear indications for med changes  FU 2 months.    Lynder Parents, MD, DFAPA   Please see After Visit Summary for  patient specific instructions.  No future appointments.   No orders of the defined types were placed in this encounter.     -------------------------------

## 2021-02-19 ENCOUNTER — Telehealth: Payer: Self-pay | Admitting: Psychiatry

## 2021-02-19 ENCOUNTER — Other Ambulatory Visit: Payer: Self-pay

## 2021-02-19 DIAGNOSIS — F902 Attention-deficit hyperactivity disorder, combined type: Secondary | ICD-10-CM

## 2021-02-19 MED ORDER — LISDEXAMFETAMINE DIMESYLATE 70 MG PO CAPS: 70.0000 mg | ORAL_CAPSULE | Freq: Every day | ORAL | 0 refills | Status: DC

## 2021-02-19 NOTE — Telephone Encounter (Signed)
Pended.

## 2021-02-19 NOTE — Telephone Encounter (Signed)
Pt needs a refill on his vyvanse 70 mg to be sent to [facility] on [address]

## 2021-02-25 DIAGNOSIS — F341 Dysthymic disorder: Secondary | ICD-10-CM | POA: Diagnosis not present

## 2021-02-25 DIAGNOSIS — F900 Attention-deficit hyperactivity disorder, predominantly inattentive type: Secondary | ICD-10-CM | POA: Diagnosis not present

## 2021-03-03 DIAGNOSIS — F900 Attention-deficit hyperactivity disorder, predominantly inattentive type: Secondary | ICD-10-CM | POA: Diagnosis not present

## 2021-03-03 DIAGNOSIS — F341 Dysthymic disorder: Secondary | ICD-10-CM | POA: Diagnosis not present

## 2021-03-11 DIAGNOSIS — F900 Attention-deficit hyperactivity disorder, predominantly inattentive type: Secondary | ICD-10-CM | POA: Diagnosis not present

## 2021-03-11 DIAGNOSIS — F341 Dysthymic disorder: Secondary | ICD-10-CM | POA: Diagnosis not present

## 2021-03-18 DIAGNOSIS — F900 Attention-deficit hyperactivity disorder, predominantly inattentive type: Secondary | ICD-10-CM | POA: Diagnosis not present

## 2021-03-18 DIAGNOSIS — F341 Dysthymic disorder: Secondary | ICD-10-CM | POA: Diagnosis not present

## 2021-03-18 DIAGNOSIS — Z20822 Contact with and (suspected) exposure to covid-19: Secondary | ICD-10-CM | POA: Diagnosis not present

## 2021-03-22 ENCOUNTER — Other Ambulatory Visit: Payer: Self-pay

## 2021-03-22 ENCOUNTER — Telehealth: Payer: Self-pay | Admitting: Psychiatry

## 2021-03-22 DIAGNOSIS — F902 Attention-deficit hyperactivity disorder, combined type: Secondary | ICD-10-CM

## 2021-03-22 MED ORDER — LISDEXAMFETAMINE DIMESYLATE 70 MG PO CAPS: 70.0000 mg | ORAL_CAPSULE | Freq: Every day | ORAL | 0 refills | Status: DC

## 2021-03-22 NOTE — Telephone Encounter (Signed)
Pended.

## 2021-03-22 NOTE — Telephone Encounter (Signed)
Zachary Dixon called to request refill of his vyvanse.  Appt 10/20.  Send to [facility] [address]

## 2021-03-25 DIAGNOSIS — F900 Attention-deficit hyperactivity disorder, predominantly inattentive type: Secondary | ICD-10-CM | POA: Diagnosis not present

## 2021-03-25 DIAGNOSIS — F341 Dysthymic disorder: Secondary | ICD-10-CM | POA: Diagnosis not present

## 2021-04-01 DIAGNOSIS — F341 Dysthymic disorder: Secondary | ICD-10-CM | POA: Diagnosis not present

## 2021-04-01 DIAGNOSIS — F900 Attention-deficit hyperactivity disorder, predominantly inattentive type: Secondary | ICD-10-CM | POA: Diagnosis not present

## 2021-04-08 DIAGNOSIS — F341 Dysthymic disorder: Secondary | ICD-10-CM | POA: Diagnosis not present

## 2021-04-08 DIAGNOSIS — F900 Attention-deficit hyperactivity disorder, predominantly inattentive type: Secondary | ICD-10-CM | POA: Diagnosis not present

## 2021-04-15 DIAGNOSIS — F341 Dysthymic disorder: Secondary | ICD-10-CM | POA: Diagnosis not present

## 2021-04-15 DIAGNOSIS — F900 Attention-deficit hyperactivity disorder, predominantly inattentive type: Secondary | ICD-10-CM | POA: Diagnosis not present

## 2021-04-17 ENCOUNTER — Other Ambulatory Visit: Payer: Self-pay | Admitting: Psychiatry

## 2021-04-17 DIAGNOSIS — F5105 Insomnia due to other mental disorder: Secondary | ICD-10-CM

## 2021-04-21 ENCOUNTER — Other Ambulatory Visit: Payer: Self-pay | Admitting: Allergy and Immunology

## 2021-04-22 DIAGNOSIS — F341 Dysthymic disorder: Secondary | ICD-10-CM | POA: Diagnosis not present

## 2021-04-22 DIAGNOSIS — F900 Attention-deficit hyperactivity disorder, predominantly inattentive type: Secondary | ICD-10-CM | POA: Diagnosis not present

## 2021-04-26 ENCOUNTER — Telehealth: Payer: Self-pay | Admitting: Psychiatry

## 2021-04-26 ENCOUNTER — Other Ambulatory Visit: Payer: Self-pay

## 2021-04-26 DIAGNOSIS — F902 Attention-deficit hyperactivity disorder, combined type: Secondary | ICD-10-CM

## 2021-04-26 MED ORDER — LISDEXAMFETAMINE DIMESYLATE 70 MG PO CAPS: 70.0000 mg | ORAL_CAPSULE | Freq: Every day | ORAL | 0 refills | Status: DC

## 2021-04-26 NOTE — Telephone Encounter (Signed)
Nicole Kindred called to request refill of his vyvanse.  Send to [facility] [address]  Appt 10/20

## 2021-04-26 NOTE — Telephone Encounter (Signed)
Pended.

## 2021-04-26 NOTE — Telephone Encounter (Signed)
Pt asks for this to be expedited if possible.

## 2021-04-26 NOTE — Telephone Encounter (Signed)
Sent RX vyvanse

## 2021-04-29 ENCOUNTER — Other Ambulatory Visit: Payer: Self-pay

## 2021-04-29 ENCOUNTER — Ambulatory Visit (INDEPENDENT_AMBULATORY_CARE_PROVIDER_SITE_OTHER): Payer: BC Managed Care – PPO | Admitting: Psychiatry

## 2021-04-29 ENCOUNTER — Encounter: Payer: Self-pay | Admitting: Psychiatry

## 2021-04-29 VITALS — BP 135/91 | HR 98

## 2021-04-29 DIAGNOSIS — F341 Dysthymic disorder: Secondary | ICD-10-CM | POA: Diagnosis not present

## 2021-04-29 DIAGNOSIS — R7989 Other specified abnormal findings of blood chemistry: Secondary | ICD-10-CM

## 2021-04-29 DIAGNOSIS — G4733 Obstructive sleep apnea (adult) (pediatric): Secondary | ICD-10-CM

## 2021-04-29 DIAGNOSIS — F902 Attention-deficit hyperactivity disorder, combined type: Secondary | ICD-10-CM | POA: Diagnosis not present

## 2021-04-29 DIAGNOSIS — Z79899 Other long term (current) drug therapy: Secondary | ICD-10-CM

## 2021-04-29 DIAGNOSIS — F411 Generalized anxiety disorder: Secondary | ICD-10-CM

## 2021-04-29 DIAGNOSIS — F319 Bipolar disorder, unspecified: Secondary | ICD-10-CM | POA: Diagnosis not present

## 2021-04-29 DIAGNOSIS — F900 Attention-deficit hyperactivity disorder, predominantly inattentive type: Secondary | ICD-10-CM | POA: Diagnosis not present

## 2021-04-29 DIAGNOSIS — F40298 Other specified phobia: Secondary | ICD-10-CM

## 2021-04-29 DIAGNOSIS — F5105 Insomnia due to other mental disorder: Secondary | ICD-10-CM

## 2021-04-29 DIAGNOSIS — G2581 Restless legs syndrome: Secondary | ICD-10-CM

## 2021-04-29 MED ORDER — LISDEXAMFETAMINE DIMESYLATE 70 MG PO CAPS: 70.0000 mg | ORAL_CAPSULE | Freq: Every day | ORAL | 0 refills | Status: DC

## 2021-04-29 MED ORDER — TRAZODONE HCL 50 MG PO TABS: ORAL_TABLET | ORAL | 0 refills | Status: DC

## 2021-04-29 MED ORDER — LITHIUM CARBONATE ER 300 MG PO TBCR: 600.0000 mg | EXTENDED_RELEASE_TABLET | Freq: Every day | ORAL | 1 refills | Status: DC

## 2021-04-29 MED ORDER — DIVALPROEX SODIUM ER 500 MG PO TB24: 1000.0000 mg | ORAL_TABLET | Freq: Every day | ORAL | 1 refills | Status: DC

## 2021-04-29 MED ORDER — L-METHYLFOLATE-B6-B12 3-35-2 MG PO TABS: 1.0000 | ORAL_TABLET | Freq: Every day | ORAL | 1 refills | Status: DC

## 2021-04-29 MED ORDER — VITAMIN D (ERGOCALCIFEROL) 1.25 MG (50000 UNIT) PO CAPS: 50000.0000 [IU] | ORAL_CAPSULE | ORAL | 3 refills | Status: DC

## 2021-04-29 NOTE — Progress Notes (Signed)
Demarian Epps [id number] 12-05-1965 55 y.o.   Subjective:   Patient ID:  Canuto Kingston is a 55 y.o. (DOB 1966-05-28) male.  Chief Complaint:  Chief Complaint  Patient presents with   Follow-up   Bipolar I disorder with depression    Anxiety   Sleeping Problem    Depression        Associated symptoms include no decreased concentration, no fatigue and no suicidal ideas.  Past medical history includes anxiety.   Anxiety Symptoms include nervous/anxious behavior. Patient reports no confusion, decreased concentration, nausea or suicidal ideas.    Medication Refill Pertinent negatives include no fatigue, nausea, sore throat or weakness.  Bridger Pizzi presents  today for follow-up of Severe TR bipolar depression and rapid cycling with marked impairment in function at work and home.    at visit October 11, 2018.  Cytomel potentiation was started for depression 25 mcg daily to increase to 50 mcg daily.  In addition there were concerns about flatness and he was wondering if it is related to side effects versus depression.  We reduced the lithium temporarily to see if that was the cause of the flatness. Reductions in lithium and depakote did not help the flatness.  No noticeable effect from the Cytomel except it did help the thyroid panel.  At visit Dec 06, 2018.  [name] was switched to Mydayis to try to improve his function full before waking day. Felt it was less effective than [name].  Didn't feel it kick in and no longer duration at maximum 50 mg so returned to [name]. It's better.   Patient was  seen August 2020.  Because his TSH was suppressed we reduced Cytomel to 37.5 mcg daily.  Because of flatness we reduced the Vraylar to 1.5 mg daily to see if there could be improvement.  Patient seen December 2020.  No med changes were made.  08/28/19 seen with wife, [name] Has been under more stress at work for 3 mos.  Not markedly depressed but falls asleep easily in evening and gets tired  easily.  Sleeping well with CPAP but may not get to bed early enough.  Falls asleep in chair and then gets to CPAP.  Not enough sleep on nights he has to travel.  Wife concerns.  Doesn't seem happy or interactive.  Used to have good days.  Can be irritable but no anger outbursts.  No mood swings.  No sense of humor which had been there before.  Still anxious and holding it in. We decided to reduce Vraylar to 1.5 mg every other day to see if flatness was improved.  Mood lability was under control.  As of 10/17/19:  Better.  Lighter and more energy.  Wife agrees.  Not depressed lately.  Not even small downturns.  Very even.   [name] notices better engagement.  Anxiety still there with work.  Big projects coming to close in next month.  Everyone at work overwhelmed.  Would love to have another job.  She says he's always been stressed by every job. Plans to look elsewhere.   100% use status of CPAP. Compliant with meds.  No med concerns. Plan: Better with Reduced Vraylar re: flatness is better.  1.5 mg every other day. No med changes today  01/09/2020 appointment with the following noted: Consistent with meds.  Pretty good without complaints. Still has prominent gag reflex but has had it his whole life but seems worse than in years past.  Makes it even hard to wear  CPAP.  Not wearing it 8 hours but 5-6 hours now and 2-3 weeks ago wasn't wearing it.  Feels much better after not wearing it. Getting 8 hours sleep per night. Mood, anxiety and focus steady otherwise. No SE with meds. Plan Rec trial with NAC for cognition and mood.  Disc in detail  06/15/2020 appointment with the following noted: Nan dx breast CA, stage 1.  No chemo planned.  She's dealing with it pretty well. Work remained stressful.  Lost 3 people but trying to keep perspective with wife's health issues.  Still pursuing another job but little time and energy for that. Mood very good overall.  Primary thing is stress and has to be careful with  it, otherwise anxiety can spend out of control.  Sleeping well. Endoscopy OK in workup with gagging problem.  Gagging interferes with CPAP use.  Can be worse with stress.  Can be difficult to convince doctor's a problem. PCP Dr. Shelia Media is good.  Overreactive gag reflex.  But sometimes can occur out of sleep.  Plan no med changes except again recommend he try NAC for cognitive reasons as noted before.  10/15/2020 appointment with following noted: [name] doing well with treatment and finished tx. Still doing therapy.  Some struggle with depression.  Has interests and not as flat as years ago. Really tired and exhausted.  Clearly some related to job and searching. Still deals with gag reflex and sleep mask and sees ENT next week.  Never had problems with claustrophobia but problems with turtle necks, dentist, CPAP now .  Barely wear the CPAP. Doing DBT.  But not following through with all of it. Plan: No med changes Better with Reduced Vraylar re: flatness is better.  1.5 mg every other day.  02/18/2021 appointment with the following noted: Unsure if covid but had sx pneumonia a few mos ago with negative tests. Lately struggling and maybe bc not using CPAP bc mask phobic and still struggles with gagging even with Covid mask.  Has worked on it.  Has tried meditation to help the gagging. Very tired without [name].  Function at home affected by some more stress and conflict.  Chronic work stress may get better with changes and still searching for new job without success.  Spent after the work day.  Not present with family.  [name] is upset.  Occ periods of despondency.   Couple weekends just walked off for 4 hours and didn't tell wife what he was doing and scared her.  Over did it. Can still enjoy and has interests. P;an: Better with Reduced Vraylar re: flatness is better.  1.5 mg every other day. No clear indications for med changes  04/29/21 appt noted: Big improvement with CPAP. Energy better. Therapist  helped with meditation at night.  Brain-spotting maybe helping with less gagging.  Most nights sleeping with the mask all night.  Pleased gagging better. Same job with a lot of stress.  Overall doing pretty well. Started men's DBT group at [facility] counseling. Pleased with Vraylar stopped mood swings. No recent mood swings. Except when forgot [name] for 3 days got depressed and couldn't function.. Patient reports stable mood and denies depressed or irritable moods.  Patient denies any recent difficulty with anxiety except work stress.  Patient denies difficulty with sleep initiation or maintenance. Denies appetite disturbance.  Patient reports that energy and motivation have been better with CPAP.  Patient denies any difficulty with concentration.  Patient denies any suicidal ideation.  Past psychiatric med  medication trials include failure of ECT and TMS,  Latuda 120 mg,  aripiprazole, olanzapine 20 mg, Symbyax Seroquel, Rexulti, Vraylar 3 pramipexole,  lamotrigine, , Depakote 1500 ataxia,  lithium at high blood levels,  Pristiq, sertraline, bupropion 450 mg caused anxiety, Lexapro, Viibryd,  Deplin, Duloxetine, Trintellix, [name] 70, Mydayis Concerta 72 mg, History of some DBT  Review of Systems:  Review of Systems  Constitutional:  Negative for fatigue.  HENT:  Negative for sore throat.        Still gagging  Gastrointestinal:  Negative for nausea.       Gagging comes and goes ongoing  Neurological:  Negative for tremors and weakness.  Psychiatric/Behavioral:  Negative for agitation, behavioral problems, confusion, decreased concentration, dysphoric mood, hallucinations, self-injury, sleep disturbance and suicidal ideas. The patient is nervous/anxious. The patient is not hyperactive.    Medications: I have reviewed the patient's current medications.  Current Outpatient Medications  Medication Sig Dispense Refill   cariprazine (VRAYLAR) capsule Take 1 capsule (1.5 mg total) by mouth  daily. (Patient taking differently: Take 1.5 mg by mouth every other day.) 30 capsule 5   famotidine (PEPCID) 40 MG tablet Take 1 tablet (40 mg total) by mouth every evening. 30 tablet 5   liothyronine (CYTOMEL) 25 MCG tablet TAKE 1 & 1/2 TABLETS BY MOUTH EVERY MORNING 135 tablet 0   pantoprazole (PROTONIX) 40 MG tablet Take 40 mg by mouth 2 (two) times daily.     divalproex (DEPAKOTE ER) 500 MG 24 hr tablet Take 2 tablets (1,000 mg total) by mouth at bedtime. 180 tablet 1   l-methylfolate-B6-B12 (METANX) 3-35-2 MG TABS tablet Take 1 tablet by mouth daily. 90 tablet 1   lisdexamfetamine ([name]) 70 MG capsule Take 1 capsule (70 mg total) by mouth daily. 90 capsule 0   lithium carbonate (LITHOBID) 300 MG CR tablet Take 2 tablets (600 mg total) by mouth at bedtime. 180 tablet 1   traZODone (DESYREL) 50 MG tablet TAKE 1 TO 3 TABLETS BY MOUTH AT BEDTIME 270 tablet 0   Vitamin D, Ergocalciferol, (DRISDOL) 1.25 MG (50000 UNIT) CAPS capsule Take 1 capsule (50,000 Units total) by mouth every 7 (seven) days. 15 capsule 3   No current facility-administered medications for this visit.    Medication Side Effects: None  Tremor not gone but less  Allergies: No Known Allergies  Past Medical History:  Diagnosis Date   ADHD    Allergy    Anxiety    Bipolar 2 disorder ([place])    Depression    Diabetes mellitus without complication (HCC)    GERD (gastroesophageal reflux disease)    Hyperlipidemia    diet controlled   Sleep apnea    uses CPAP nightly   Thyroid disease     Family History  Problem Relation Age of Onset   Asthma Father    Emphysema Father    Heart disease Paternal Grandfather    Heart disease Paternal Grandmother    Breast cancer Maternal Grandmother    Colon cancer Maternal Grandfather    Rectal cancer Neg Hx    Stomach cancer Neg Hx    Esophageal cancer Neg Hx     Social History   Socioeconomic History   Marital status: Married    Spouse name: Not on file   Number of  children: 1   Years of education: Not on file   Highest education level: Not on file  Occupational History   Occupation: marketing  Tobacco Use   Smoking status:  Former    Packs/day: 1.00    Years: 7.00    Pack years: 7.00    Types: Cigarettes    Quit date: 07/11/1992    Years since quitting: 28.8   Smokeless tobacco: Never  Vaping Use   Vaping Use: Never used  Substance and Sexual Activity   Alcohol use: Yes    Alcohol/week: 4.0 standard drinks    Types: 4 Standard drinks or equivalent per week   Drug use: No   Sexual activity: Not Currently  Other Topics Concern   Not on file  Social History Narrative   Not on file   Social Determinants of Health   Financial Resource Strain: Not on file  Food Insecurity: Not on file  Transportation Needs: Not on file  Physical Activity: Not on file  Stress: Not on file  Social Connections: Not on file  Intimate Partner Violence: Not on file    Past Medical History, Surgical history, Social history, and Family history were reviewed and updated as appropriate.   Son sam.  Please see review of systems for further details on the patient's review from today.   Objective:   Physical Exam:  BP (!) 135/91   Pulse 98   Physical Exam Constitutional:      General: He is not in acute distress.    Appearance: Normal appearance.  Musculoskeletal:        General: No deformity.  Neurological:     Mental Status: He is alert and oriented to person, place, and time.     Cranial Nerves: No dysarthria.     Coordination: Coordination normal.  Psychiatric:        Attention and Perception: Attention and perception normal. He does not perceive auditory or visual hallucinations.        Mood and Affect: Mood is anxious. Mood is not depressed. Affect is not labile, blunt, angry or inappropriate.        Speech: Speech normal.        Behavior: Behavior normal. Behavior is cooperative.        Thought Content: Thought content normal. Thought content  is not paranoid or delusional. Thought content does not include homicidal or suicidal ideation. Thought content does not include homicidal or suicidal plan.        Cognition and Memory: Cognition and memory normal.        Judgment: Judgment normal.     Comments: Insight intact Some increase in mood swings but brief    Lab Review:     Component Value Date/Time   NA 141 08/26/2016 1959   K 4.0 08/26/2016 1959   CL 112 (H) 08/26/2016 1959   CO2 24 08/26/2016 1959   GLUCOSE 106 (H) 08/26/2016 1959   BUN 19 08/26/2016 1959   CREATININE 1.06 08/26/2016 1959   CALCIUM 8.8 (L) 08/26/2016 1959   PROT 6.7 08/26/2016 1959   ALBUMIN 3.8 08/26/2016 1959   AST 18 08/26/2016 1959   ALT 21 08/26/2016 1959   ALKPHOS 60 08/26/2016 1959   BILITOT 0.6 08/26/2016 1959   GFRNONAA >60 08/26/2016 1959   GFRAA >60 08/26/2016 1959       Component Value Date/Time   WBC 8.6 08/26/2016 1959   RBC 4.35 08/26/2016 1959   HGB 13.7 08/26/2016 1959   HCT 39.8 08/26/2016 1959   PLT 221 08/26/2016 1959   MCV 91.5 08/26/2016 1959   MCH 31.5 08/26/2016 1959   MCHC 34.4 08/26/2016 1959   RDW 12.6 08/26/2016 1959  LYMPHSABS 2.4 08/26/2016 1959   MONOABS 0.4 08/26/2016 1959   EOSABS 0.2 08/26/2016 1959   BASOSABS 0.0 08/26/2016 1959    Lithium Lvl  Date Value Ref Range Status  04/02/2018 0.7 0.6 - 1.2 mmol/L Final     Lab Results  Component Value Date   VALPROATE 54.6 04/02/2018    TSH at 3   Normal ammonia 47 in 2019.  .res Assessment: Plan:    Bipolar I disorder with depression ([place]) - Plan: divalproex (DEPAKOTE ER) 500 MG 24 hr tablet, lithium carbonate (LITHOBID) 300 MG CR tablet, l-methylfolate-B6-B12 (METANX) 3-35-2 MG TABS tablet  Generalized anxiety disorder  Attention deficit hyperactivity disorder (ADHD), combined type - Plan: lisdexamfetamine ([name]) 70 MG capsule  Obstructive sleep apnea  Insomnia due to mental condition - Plan: traZODone (DESYREL) 50 MG tablet  Restless  legs syndrome  Low vitamin D level - Plan: Vitamin D, Ergocalciferol, (DRISDOL) 1.25 MG (50000 UNIT) CAPS capsule  Lithium use  Other specified phobia   Greater than 50% of face to face time with patient was spent on counseling and coordination of care. 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  His symptoms have been severe and very resistant to any benefit.    He cannot tolerate an increase in the Depakote based on prior ataxic experiences at 1500 mg daily.  Higher doses of lithium have not helped anymore.  He has failed multiple medications and ECT and [place]. HowEver the flatness is much improved with the reduction in in Vraylar to 1.5 mg every other day.  He has had no worsening anxiety, mood swings nor depression.  Gagging appears phobic and  not med related.  Can occur before or after asleep. Med changes have not helped. Can experimenet with trazodone to see if it will help better with different doses.  He was not having extreme cycling like he did in the past.  We previously had reduced lithium to 600 mg daily and reduce Depakote to 500 mg daily so those were not changed. Overall his mood is worse a bit and may be due to reduced use of CPAP bc gagging.  He has been less and less compliant with his CPAP lately due to gagging problem to be evaluatied by ENT in Jan.  More tired. Xanax 0.5 mg HS stopped bc didn't help gagging. No indication that psych meds causing tiredness. Disc [facility] rec look into it. Marland Kitchen RLS managed.  Pramipexole prn for RLS this is managed at this time.  Continue vitamin D check level later.  Continue B12. Extensive discussions about possible supplements that could be helpful.  He will continue the following. B-Complex 150 Still rec N-Acetylcysteine at 1000-1200 mg daily to help with mild cognitive problems and depression.  It can be combined with a B-complex  vitamin as the B-12 and folate have been shown to sometimes enhance the effect. L-methylfolate 15 mg daily for depression.  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  Discussed his polypharmacy which is not ideal but has been necessary to control the mood cycling and now his depression has improved.  Plus he also has ADD that has to be managed.  He is satisfied with his ADD management with [name].  Discussed working on work life balance bc problems there. Cont counseling.  Better with Reduced Vraylar re: flatness is better.  1.5 mg every other day. No clear  indications for med changes  PE with Dr. Shelia Media November 2022 for labs. Check lithium level, TSH, D  FU 3 months.    Lynder Parents, MD, DFAPA   Please see After Visit Summary for patient specific instructions.  No future appointments.   No orders of the defined types were placed in this encounter.     -------------------------------

## 2021-05-13 DIAGNOSIS — F900 Attention-deficit hyperactivity disorder, predominantly inattentive type: Secondary | ICD-10-CM | POA: Diagnosis not present

## 2021-05-13 DIAGNOSIS — Z125 Encounter for screening for malignant neoplasm of prostate: Secondary | ICD-10-CM | POA: Diagnosis not present

## 2021-05-13 DIAGNOSIS — F341 Dysthymic disorder: Secondary | ICD-10-CM | POA: Diagnosis not present

## 2021-05-14 DIAGNOSIS — E039 Hypothyroidism, unspecified: Secondary | ICD-10-CM | POA: Diagnosis not present

## 2021-05-14 DIAGNOSIS — R739 Hyperglycemia, unspecified: Secondary | ICD-10-CM | POA: Diagnosis not present

## 2021-05-14 DIAGNOSIS — Z0000 Encounter for general adult medical examination without abnormal findings: Secondary | ICD-10-CM | POA: Diagnosis not present

## 2021-05-14 DIAGNOSIS — Z125 Encounter for screening for malignant neoplasm of prostate: Secondary | ICD-10-CM | POA: Diagnosis not present

## 2021-05-15 ENCOUNTER — Other Ambulatory Visit: Payer: Self-pay | Admitting: Allergy and Immunology

## 2021-05-20 DIAGNOSIS — F341 Dysthymic disorder: Secondary | ICD-10-CM | POA: Diagnosis not present

## 2021-05-20 DIAGNOSIS — F900 Attention-deficit hyperactivity disorder, predominantly inattentive type: Secondary | ICD-10-CM | POA: Diagnosis not present

## 2021-05-27 DIAGNOSIS — F341 Dysthymic disorder: Secondary | ICD-10-CM | POA: Diagnosis not present

## 2021-05-27 DIAGNOSIS — F900 Attention-deficit hyperactivity disorder, predominantly inattentive type: Secondary | ICD-10-CM | POA: Diagnosis not present

## 2021-05-28 IMAGING — US US THYROID
1 series · 14 of 25 positions shown · non-contrast
Comparison: None.

CLINICAL DATA: 54-year-old male with thyroid enlargement

EXAM:
THYROID ULTRASOUND
TECHNIQUE: Ultrasound examination of the thyroid gland and adjacent soft
tissues was performed.

[Series 1: us thyroid · 0.05mm/px · 14 of 43 slices shown]
[im 1/43]
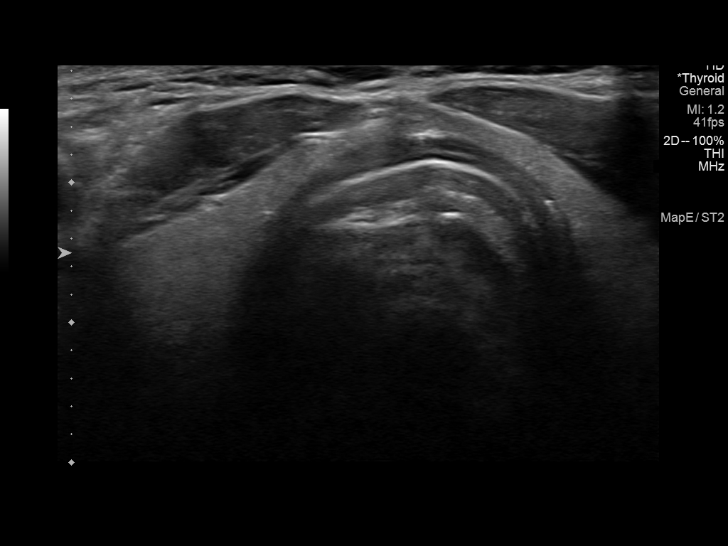
[im 4/43]
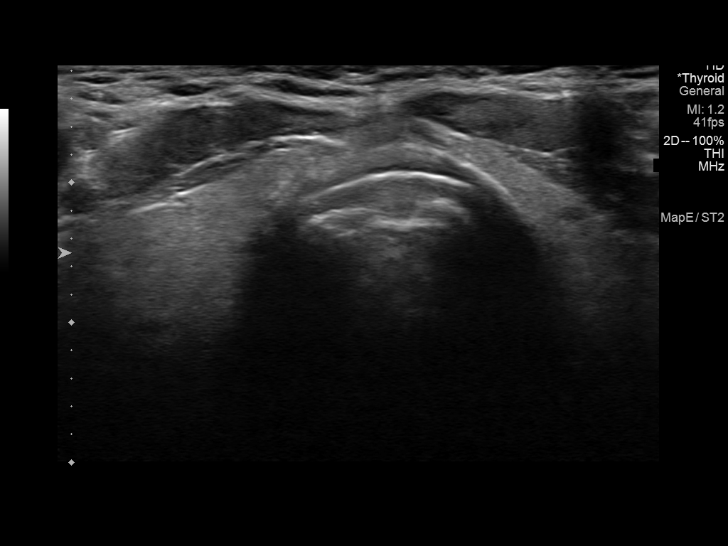
[im 8/43]
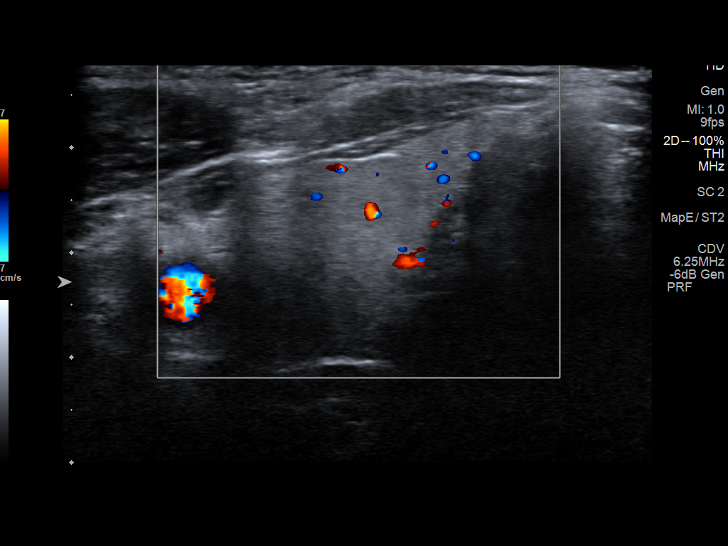
[im 11/43]
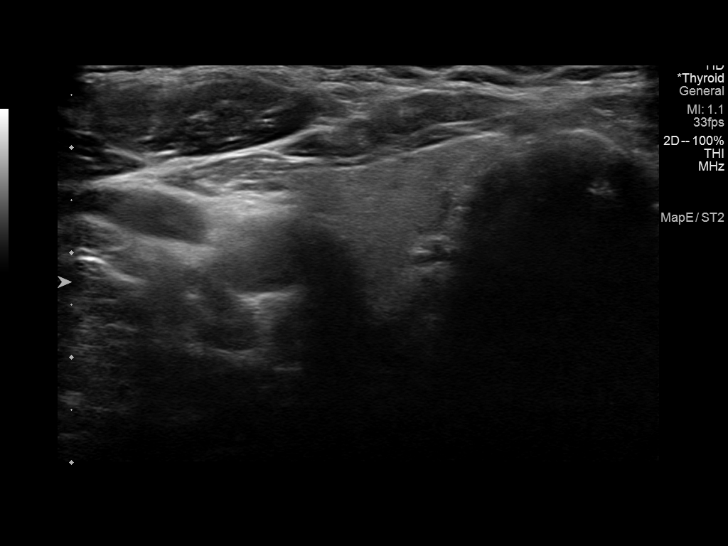
[im 15/43]
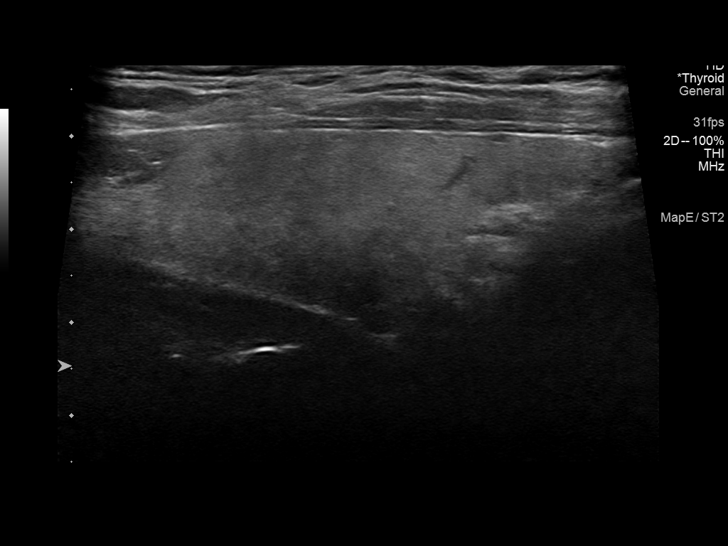
[im 16/43]
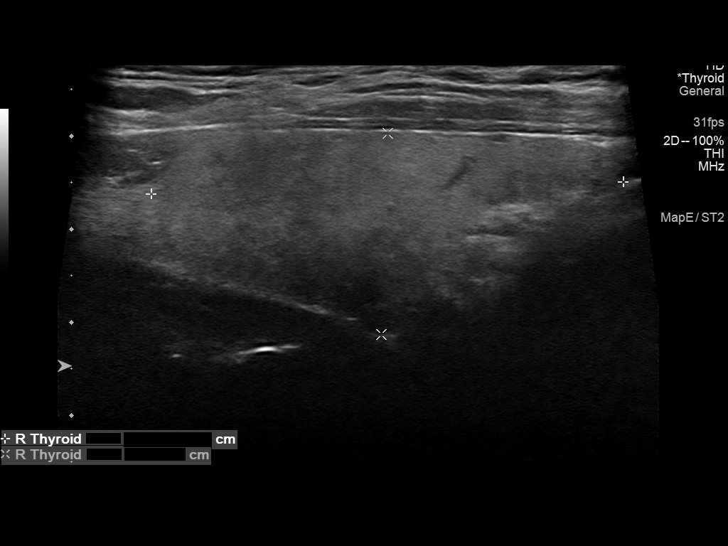
[im 20/43]
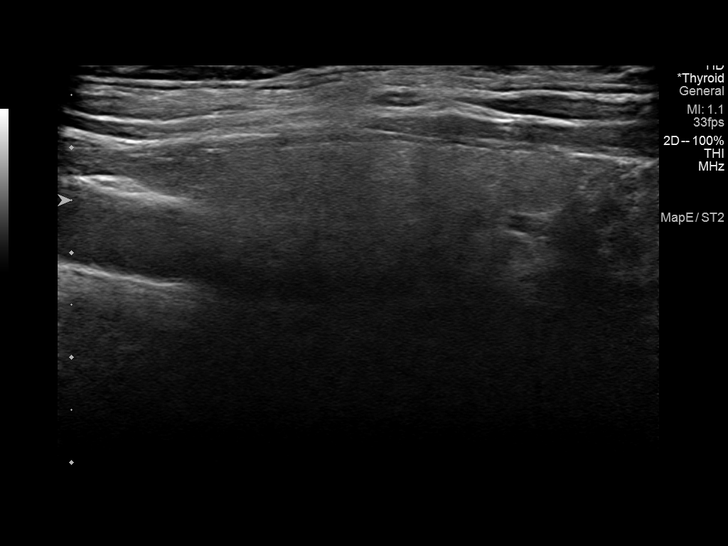
[im 23/43]
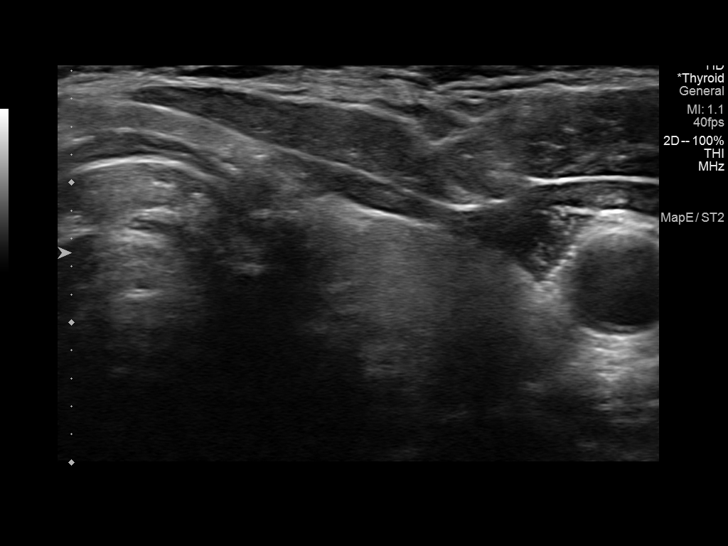
[im 27/43]
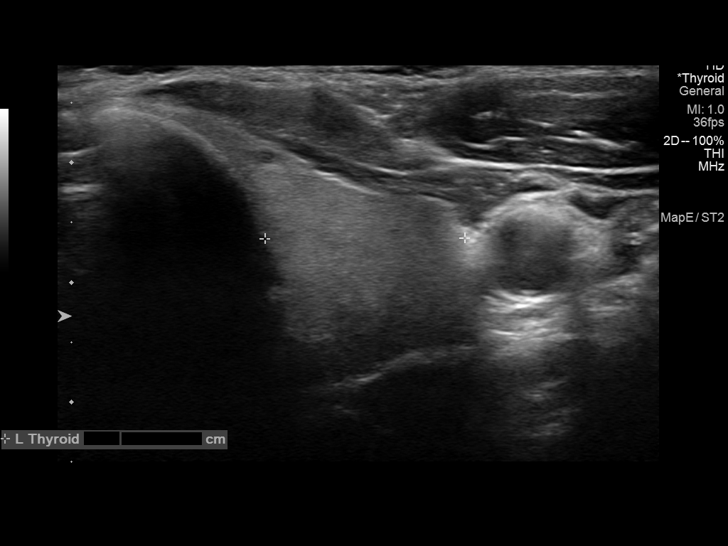
[im 29/43]
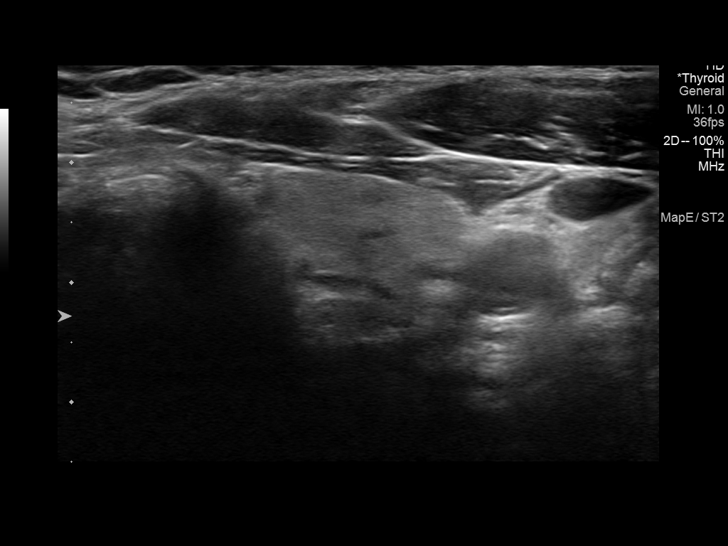
[im 32/43]
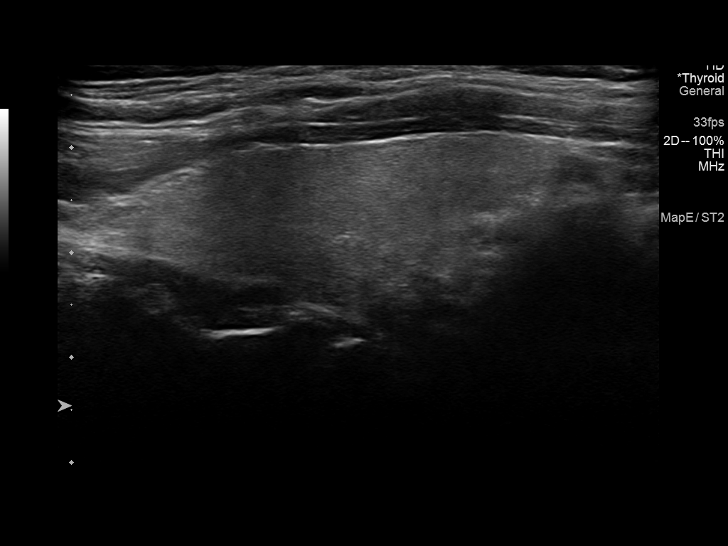
[im 36/43]
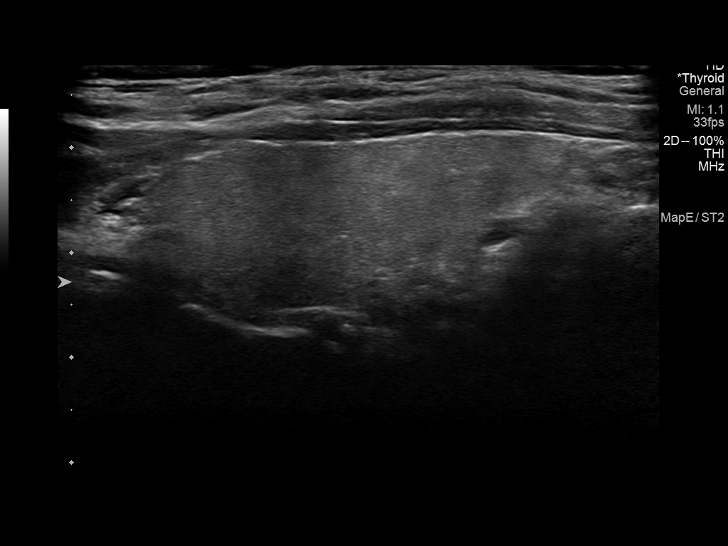
[im 39/43]
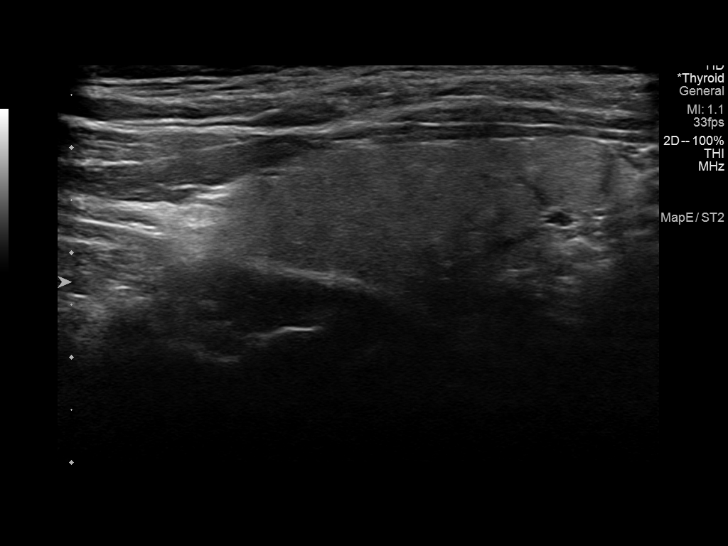
[im 43/43]
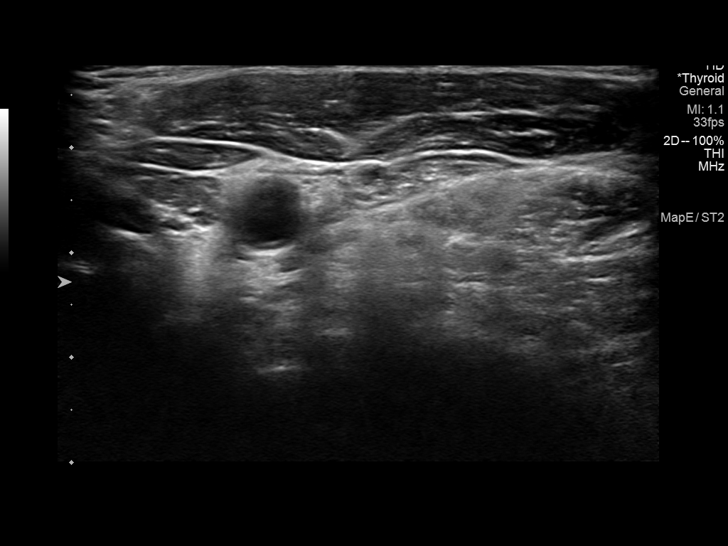

[14 of 25 positions shown; findings below may reference images not displayed]

FINDINGS: Parenchymal Echotexture: Mildly heterogenous

Isthmus: 0.3 cm

Right lobe: 5.1 cm x 2.2 cm x 2.1 cm

Left lobe: 4.5 cm x 1.5 cm x 1.7 cm

_________________________________________________________

Estimated total number of nodules >/= 1 cm: 0

Number of spongiform nodules >/=  2 cm not described below (TR1): 0

Number of mixed cystic and solid nodules >/= 1.5 cm not described
below (TR2): 0

_________________________________________________________

No discrete nodules are seen within the thyroid gland.

No adenopathy
IMPRESSION: Mildly heterogeneous thyroid, not significantly enlarged.

## 2021-05-31 DIAGNOSIS — F319 Bipolar disorder, unspecified: Secondary | ICD-10-CM | POA: Diagnosis not present

## 2021-05-31 DIAGNOSIS — F419 Anxiety disorder, unspecified: Secondary | ICD-10-CM | POA: Diagnosis not present

## 2021-05-31 DIAGNOSIS — Z0000 Encounter for general adult medical examination without abnormal findings: Secondary | ICD-10-CM | POA: Diagnosis not present

## 2021-05-31 DIAGNOSIS — E039 Hypothyroidism, unspecified: Secondary | ICD-10-CM | POA: Diagnosis not present

## 2021-05-31 DIAGNOSIS — Z23 Encounter for immunization: Secondary | ICD-10-CM | POA: Diagnosis not present

## 2021-06-02 DIAGNOSIS — E781 Pure hyperglyceridemia: Secondary | ICD-10-CM | POA: Diagnosis not present

## 2021-06-07 ENCOUNTER — Telehealth: Payer: Self-pay | Admitting: Allergy and Immunology

## 2021-06-07 NOTE — Telephone Encounter (Signed)
Patient was seen 10/27/20. He is requesting a refill for famotidine. [facility] [address].

## 2021-06-09 DIAGNOSIS — Z23 Encounter for immunization: Secondary | ICD-10-CM | POA: Diagnosis not present

## 2021-06-10 DIAGNOSIS — F341 Dysthymic disorder: Secondary | ICD-10-CM | POA: Diagnosis not present

## 2021-06-10 DIAGNOSIS — F900 Attention-deficit hyperactivity disorder, predominantly inattentive type: Secondary | ICD-10-CM | POA: Diagnosis not present

## 2021-06-18 DIAGNOSIS — R0602 Shortness of breath: Secondary | ICD-10-CM | POA: Diagnosis not present

## 2021-06-18 DIAGNOSIS — R079 Chest pain, unspecified: Secondary | ICD-10-CM | POA: Diagnosis not present

## 2021-06-18 DIAGNOSIS — Z20822 Contact with and (suspected) exposure to covid-19: Secondary | ICD-10-CM | POA: Diagnosis not present

## 2021-06-19 ENCOUNTER — Other Ambulatory Visit: Payer: Self-pay | Admitting: Allergy and Immunology

## 2021-06-24 DIAGNOSIS — F900 Attention-deficit hyperactivity disorder, predominantly inattentive type: Secondary | ICD-10-CM | POA: Diagnosis not present

## 2021-06-24 DIAGNOSIS — F341 Dysthymic disorder: Secondary | ICD-10-CM | POA: Diagnosis not present

## 2021-07-01 DIAGNOSIS — F341 Dysthymic disorder: Secondary | ICD-10-CM | POA: Diagnosis not present

## 2021-07-01 DIAGNOSIS — F900 Attention-deficit hyperactivity disorder, predominantly inattentive type: Secondary | ICD-10-CM | POA: Diagnosis not present

## 2021-07-08 DIAGNOSIS — F341 Dysthymic disorder: Secondary | ICD-10-CM | POA: Diagnosis not present

## 2021-07-08 DIAGNOSIS — F900 Attention-deficit hyperactivity disorder, predominantly inattentive type: Secondary | ICD-10-CM | POA: Diagnosis not present

## 2021-07-15 DIAGNOSIS — F900 Attention-deficit hyperactivity disorder, predominantly inattentive type: Secondary | ICD-10-CM | POA: Diagnosis not present

## 2021-07-15 DIAGNOSIS — F341 Dysthymic disorder: Secondary | ICD-10-CM | POA: Diagnosis not present

## 2021-07-22 DIAGNOSIS — F341 Dysthymic disorder: Secondary | ICD-10-CM | POA: Diagnosis not present

## 2021-07-22 DIAGNOSIS — F900 Attention-deficit hyperactivity disorder, predominantly inattentive type: Secondary | ICD-10-CM | POA: Diagnosis not present

## 2021-07-29 DIAGNOSIS — F900 Attention-deficit hyperactivity disorder, predominantly inattentive type: Secondary | ICD-10-CM | POA: Diagnosis not present

## 2021-07-29 DIAGNOSIS — F341 Dysthymic disorder: Secondary | ICD-10-CM | POA: Diagnosis not present

## 2021-08-05 ENCOUNTER — Ambulatory Visit (INDEPENDENT_AMBULATORY_CARE_PROVIDER_SITE_OTHER): Payer: BC Managed Care – PPO | Admitting: Psychiatry

## 2021-08-05 ENCOUNTER — Other Ambulatory Visit: Payer: Self-pay

## 2021-08-05 ENCOUNTER — Encounter: Payer: Self-pay | Admitting: Psychiatry

## 2021-08-05 VITALS — BP 129/86 | HR 86

## 2021-08-05 DIAGNOSIS — F902 Attention-deficit hyperactivity disorder, combined type: Secondary | ICD-10-CM

## 2021-08-05 DIAGNOSIS — Z79899 Other long term (current) drug therapy: Secondary | ICD-10-CM

## 2021-08-05 DIAGNOSIS — F319 Bipolar disorder, unspecified: Secondary | ICD-10-CM

## 2021-08-05 DIAGNOSIS — F411 Generalized anxiety disorder: Secondary | ICD-10-CM

## 2021-08-05 DIAGNOSIS — F5105 Insomnia due to other mental disorder: Secondary | ICD-10-CM

## 2021-08-05 DIAGNOSIS — G4733 Obstructive sleep apnea (adult) (pediatric): Secondary | ICD-10-CM | POA: Diagnosis not present

## 2021-08-05 DIAGNOSIS — R7989 Other specified abnormal findings of blood chemistry: Secondary | ICD-10-CM

## 2021-08-05 DIAGNOSIS — G2581 Restless legs syndrome: Secondary | ICD-10-CM

## 2021-08-05 MED ORDER — TRAZODONE HCL 50 MG PO TABS: ORAL_TABLET | ORAL | 0 refills | Status: DC

## 2021-08-05 MED ORDER — CARIPRAZINE HCL 1.5 MG PO CAPS: 1.5000 mg | ORAL_CAPSULE | Freq: Every day | ORAL | 5 refills | Status: DC

## 2021-08-05 MED ORDER — LISDEXAMFETAMINE DIMESYLATE 70 MG PO CAPS: 70.0000 mg | ORAL_CAPSULE | Freq: Every day | ORAL | 0 refills | Status: DC

## 2021-08-05 MED ORDER — LIOTHYRONINE SODIUM 25 MCG PO TABS: ORAL_TABLET | ORAL | 0 refills | Status: DC

## 2021-08-05 NOTE — Progress Notes (Signed)
Zachary Dixon [id number] 06-Jan-1966 56 y.o.   Subjective:   Patient ID:  Zachary Dixon is a 56 y.o. (DOB 23-Jan-1966) male.  Chief Complaint:  Chief Complaint  Patient presents with   Follow-up    Bipolar I disorder with depression ([place])   Anxiety   Stress    Depression        Associated symptoms include no decreased concentration, no fatigue and no suicidal ideas.  Past medical history includes anxiety.   Anxiety Symptoms include nervous/anxious behavior. Patient reports no confusion, decreased concentration, nausea or suicidal ideas.    Medication Refill Pertinent negatives include no fatigue, nausea, sore throat or weakness.  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    at visit October 11, 2018.  Cytomel potentiation was started for depression 25 mcg daily to increase to 50 mcg daily.  In addition there were concerns about flatness and he was wondering if it is related to side effects versus depression.  We reduced the lithium temporarily to see if that was the cause of the flatness. Reductions in lithium and depakote did not help the flatness.  No noticeable effect from the Cytomel except it did help the thyroid panel.  At visit Dec 06, 2018.  [name] was switched to Mydayis to try to improve his function full before waking day. Felt it was less effective than [name].  Didn't feel it kick in and no longer duration at maximum 50 mg so returned to [name]. It's better.   Patient was  seen August 2020.  Because his TSH was suppressed we reduced Cytomel to 37.5 mcg daily.  Because of flatness we reduced the Vraylar to 1.5 mg daily to see if there could be improvement.  Patient seen December 2020.  No med changes were made.  08/28/19 seen with wife, Zachary Dixon Has been under more stress at work for 3 mos.  Not markedly depressed but falls asleep easily in evening and gets tired  easily.  Sleeping well with CPAP but may not get to bed early enough.  Falls asleep in chair and then gets to CPAP.  Not enough sleep on nights he has to travel.  Wife concerns.  Doesn't seem happy or interactive.  Used to have good days.  Can be irritable but no anger outbursts.  No mood swings.  No sense of humor which had been there before.  Still anxious and holding it in. We decided to reduce Vraylar to 1.5 mg every other day to see if flatness was improved.  Mood lability was under control.  As of 10/17/19:  Better.  Lighter and more energy.  Wife agrees.  Not depressed lately.  Not even small downturns.  Very even.   Zachary Dixon notices better engagement.  Anxiety still there with work.  Big projects coming to close in next month.  Everyone at work overwhelmed.  Would love to have another job.  She says he's always been stressed by every job. Plans to look elsewhere.   100% use status of CPAP. Compliant with meds.  No med concerns. Plan: Better with Reduced Vraylar re: flatness is better.  1.5 mg every other day. No med changes today  01/09/2020 appointment with the following noted: Consistent with meds.  Pretty good without complaints. Still has prominent gag reflex but has had it his whole life but seems worse than in years past.  Makes it even hard to wear  CPAP.  Not wearing it 8 hours but 5-6 hours now and 2-3 weeks ago wasn't wearing it.  Feels much better after not wearing it. Getting 8 hours sleep per night. Mood, anxiety and focus steady otherwise. No SE with meds. Plan Rec trial with NAC for cognition and mood.  Disc in detail  06/15/2020 appointment with the following noted: Nan dx breast CA, stage 1.  No chemo planned.  She's dealing with it pretty well. Work remained stressful.  Lost 3 people but trying to keep perspective with wife's health issues.  Still pursuing another job but little time and energy for that. Mood very good overall.  Primary thing is stress and has to be careful with  it, otherwise anxiety can spend out of control.  Sleeping well. Endoscopy OK in workup with gagging problem.  Gagging interferes with CPAP use.  Can be worse with stress.  Can be difficult to convince doctor's a problem. PCP Dr. Shelia Media is good.  Overreactive gag reflex.  But sometimes can occur out of sleep.  Plan no med changes except again recommend he try NAC for cognitive reasons as noted before.  10/15/2020 appointment with following noted: Zachary Dixon doing well with treatment and finished tx. Still doing therapy.  Some struggle with depression.  Has interests and not as flat as years ago. Really tired and exhausted.  Clearly some related to job and searching. Still deals with gag reflex and sleep mask and sees ENT next week.  Never had problems with claustrophobia but problems with turtle necks, dentist, CPAP now .  Barely wear the CPAP. Doing DBT.  But not following through with all of it. Plan: No med changes Better with Reduced Vraylar re: flatness is better.  1.5 mg every other day.  02/18/2021 appointment with the following noted: Unsure if covid but had sx pneumonia a few mos ago with negative tests. Lately struggling and maybe bc not using CPAP bc mask phobic and still struggles with gagging even with Covid mask.  Has worked on it.  Has tried meditation to help the gagging. Very tired without [name].  Function at home affected by some more stress and conflict.  Chronic work stress may get better with changes and still searching for new job without success.  Spent after the work day.  Not present with family.  Zachary Dixon is upset.  Occ periods of despondency.   Couple weekends just walked off for 4 hours and didn't tell wife what he was doing and scared her.  Over did it. Can still enjoy and has interests. P;an: Better with Reduced Vraylar re: flatness is better.  1.5 mg every other day. No clear indications for med changes  04/29/21 appt noted: Big improvement with CPAP. Energy better. Therapist  helped with meditation at night.  Brain-spotting maybe helping with less gagging.  Most nights sleeping with the mask all night.  Pleased gagging better. Same job with a lot of stress.  Overall doing pretty well. Started men's DBT group at [facility] counseling. Pleased with Vraylar stopped mood swings. No recent mood swings. Except when forgot [name] for 3 days got depressed and couldn't function.. Patient reports stable mood and denies depressed or irritable moods.  Patient denies any recent difficulty with anxiety except work stress.  Patient denies difficulty with sleep initiation or maintenance. Denies appetite disturbance.  Patient reports that energy and motivation have been better with CPAP.  Patient denies any difficulty with concentration.  Patient denies any suicidal ideation.  08/05/21 appt noted:  Mental health pretty good.  Still a lot of stress at work.  Trying to look for another job as goal.  Several years ago would have been stress and crushed but managing better. Minor mood swings and not depressed.  Tired and work 60 hours per week. No problems known with meds. Doesn't like CPAP DT gagging which is better and wants Inspire. Izora Gala doing well with breast CA. No problems with the meds. Sleep is OK except hard to get enough.  Past psychiatric med medication trials include failure of ECT and TMS,  Latuda 120 mg,  aripiprazole, olanzapine 20 mg, Symbyax Seroquel, Rexulti, Vraylar 3 pramipexole,  lamotrigine, , Depakote 1500 ataxia,  lithium at high blood levels,  Pristiq, sertraline, bupropion 450 mg caused anxiety, Lexapro, Viibryd,  Deplin, Duloxetine, Trintellix, [name] 70, Mydayis Concerta 72 mg, History of some DBT  Review of Systems:  Review of Systems  Constitutional:  Negative for fatigue.  HENT:  Negative for sore throat.        Still gagging  Gastrointestinal:  Negative for nausea.       Gagging comes and goes ongoing  Neurological:  Negative for tremors and  weakness.  Psychiatric/Behavioral:  Positive for sleep disturbance. Negative for agitation, behavioral problems, confusion, decreased concentration, dysphoric mood, hallucinations, self-injury and suicidal ideas. The patient is nervous/anxious. The patient is not hyperactive.    Medications: I have reviewed the patient's current medications.  Current Outpatient Medications  Medication Sig Dispense Refill   divalproex (DEPAKOTE ER) 500 MG 24 hr tablet Take 2 tablets (1,000 mg total) by mouth at bedtime. 180 tablet 1   famotidine (PEPCID) 40 MG tablet TAKE 1 TABLET BY MOUTH EVERY DAY IN THE EVENING 90 tablet 0   l-methylfolate-B6-B12 (METANX) 3-35-2 MG TABS tablet Take 1 tablet by mouth daily. 90 tablet 1   lithium carbonate (LITHOBID) 300 MG CR tablet Take 2 tablets (600 mg total) by mouth at bedtime. 180 tablet 1   pantoprazole (PROTONIX) 40 MG tablet Take 40 mg by mouth 2 (two) times daily.     Vitamin D, Ergocalciferol, (DRISDOL) 1.25 MG (50000 UNIT) CAPS capsule Take 1 capsule (50,000 Units total) by mouth every 7 (seven) days. 15 capsule 3   cariprazine (VRAYLAR) 1.5 MG capsule Take 1 capsule (1.5 mg total) by mouth daily. 30 capsule 5   liothyronine (CYTOMEL) 25 MCG tablet TAKE 1 & 1/2 TABLETS BY MOUTH EVERY MORNING 135 tablet 0   lisdexamfetamine ([name]) 70 MG capsule Take 1 capsule (70 mg total) by mouth daily. 90 capsule 0   traZODone (DESYREL) 50 MG tablet TAKE 1 TO 3 TABLETS BY MOUTH AT BEDTIME 270 tablet 0   No current facility-administered medications for this visit.    Medication Side Effects: None  Tremor not gone but less  Allergies: No Known Allergies  Past Medical History:  Diagnosis Date   ADHD    Allergy    Anxiety    Bipolar 2 disorder ([place])    Depression    Diabetes mellitus without complication (H[facility])    GERD (gastroesophageal reflux disease)    Hyperlipidemia    diet controlled   Sleep apnea    uses CPAP nightly   Thyroid disease     Family History   Problem Relation Age of Onset   Asthma Father    Emphysema Father    Heart disease Paternal Grandfather    Heart disease Paternal Grandmother    Breast cancer Maternal Grandmother    Colon cancer Maternal Grandfather  Rectal cancer Neg Hx    Stomach cancer Neg Hx    Esophageal cancer Neg Hx     Social History   Socioeconomic History   Marital status: Married    Spouse name: Not on file   Number of children: 1   Years of education: Not on file   Highest education level: Not on file  Occupational History   Occupation: marketing  Tobacco Use   Smoking status: Former    Packs/day: 1.00    Years: 7.00    Pack years: 7.00    Types: Cigarettes    Quit date: 07/11/1992    Years since quitting: 29.0   Smokeless tobacco: Never  Vaping Use   Vaping Use: Never used  Substance and Sexual Activity   Alcohol use: Yes    Alcohol/week: 4.0 standard drinks    Types: 4 Standard drinks or equivalent per week   Drug use: No   Sexual activity: Not Currently  Other Topics Concern   Not on file  Social History Narrative   Not on file   Social Determinants of Health   Financial Resource Strain: Not on file  Food Insecurity: Not on file  Transportation Needs: Not on file  Physical Activity: Not on file  Stress: Not on file  Social Connections: Not on file  Intimate Partner Violence: Not on file    Past Medical History, Surgical history, Social history, and Family history were reviewed and updated as appropriate.   Son sam.  Please see review of systems for further details on the patient's review from today.   Objective:   Physical Exam:  BP 129/86    Pulse 86   Physical Exam Constitutional:      General: He is not in acute distress.    Appearance: Normal appearance.  Musculoskeletal:        General: No deformity.  Neurological:     Mental Status: He is alert and oriented to person, place, and time.     Cranial Nerves: No dysarthria.     Coordination: Coordination  normal.  Psychiatric:        Attention and Perception: Attention and perception normal. He does not perceive auditory or visual hallucinations.        Mood and Affect: Mood is anxious. Mood is not depressed. Affect is not labile, blunt, angry or inappropriate.        Speech: Speech normal.        Behavior: Behavior normal. Behavior is cooperative.        Thought Content: Thought content normal. Thought content is not paranoid or delusional. Thought content does not include homicidal or suicidal ideation. Thought content does not include suicidal plan.        Cognition and Memory: Cognition and memory normal.        Judgment: Judgment normal.     Comments: Insight intact     Lab Review:     Component Value Date/Time   NA 141 08/26/2016 1959   K 4.0 08/26/2016 1959   CL 112 (H) 08/26/2016 1959   CO2 24 08/26/2016 1959   GLUCOSE 106 (H) 08/26/2016 1959   BUN 19 08/26/2016 1959   CREATININE 1.06 08/26/2016 1959   CALCIUM 8.8 (L) 08/26/2016 1959   PROT 6.7 08/26/2016 1959   ALBUMIN 3.8 08/26/2016 1959   AST 18 08/26/2016 1959   ALT 21 08/26/2016 1959   ALKPHOS 60 08/26/2016 1959   BILITOT 0.6 08/26/2016 1959   GFRNONAA >60 08/26/2016 1959  GFRAA >60 08/26/2016 1959       Component Value Date/Time   WBC 8.6 08/26/2016 1959   RBC 4.35 08/26/2016 1959   HGB 13.7 08/26/2016 1959   HCT 39.8 08/26/2016 1959   PLT 221 08/26/2016 1959   MCV 91.5 08/26/2016 1959   MCH 31.5 08/26/2016 1959   MCHC 34.4 08/26/2016 1959   RDW 12.6 08/26/2016 1959   LYMPHSABS 2.4 08/26/2016 1959   MONOABS 0.4 08/26/2016 1959   EOSABS 0.2 08/26/2016 1959   BASOSABS 0.0 08/26/2016 1959    Lithium Lvl  Date Value Ref Range Status  04/02/2018 0.7 0.6 - 1.2 mmol/L Final     Lab Results  Component Value Date   VALPROATE 54.6 04/02/2018    TSH at 3   Normal ammonia 47 in 2019.  .res Assessment: Plan:    Bipolar I disorder with depression ([place]) - Plan: cariprazine (VRAYLAR) 1.5 MG capsule,  liothyronine (CYTOMEL) 25 MCG tablet  Generalized anxiety disorder  Attention deficit hyperactivity disorder (ADHD), combined type - Plan: lisdexamfetamine ([name]) 70 MG capsule  Obstructive sleep apnea  Insomnia due to mental condition - Plan: traZODone (DESYREL) 50 MG tablet  Restless legs syndrome  Low vitamin D level  Lithium use   Greater than 50% of face to face time with patient was spent on counseling and coordination of care. 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  His symptoms have been severe and very resistant to any benefit.    He cannot tolerate an increase in the Depakote based on prior ataxic experiences at 1500 mg daily.  Higher doses of lithium have not helped anymore.  He has failed multiple medications and ECT and [place]. HowEver the flatness is much improved with the reduction in in Vraylar to 1.5 mg every other day.  He has had no worsening anxiety, mood swings nor depression.  Gagging appears phobic and  not med related.  Can occur before or after asleep. Med changes have not helped. Can experimenet with trazodone to see if it will help better with different doses.  He was not having extreme cycling like he did in the past.  We previously had reduced lithium to 600 mg daily and reduce Depakote to 500 mg daily so those were not changed. Overall his mood is worse a bit and may be due to reduced use of CPAP bc gagging.  He has been less and less compliant with his CPAP lately due to gagging problem to be evaluatied by ENT in Jan.  More tired. Xanax 0.5 mg HS stopped bc didn't help gagging. No indication that psych meds causing tiredness. Disc Inspire rec look into it. Marland Kitchen RLS managed.  Pramipexole prn for RLS this is managed at this time.  Continue vitamin D check level later.  Continue B12. Extensive discussions about possible supplements that could be  helpful.  He will continue the following. B-Complex 150 Still rec N-Acetylcysteine at 1000-1200 mg daily to help with mild cognitive problems and depression.  It can be combined with a B-complex vitamin as the B-12 and folate have been shown to sometimes enhance the effect. L-methylfolate 15 mg daily for depression.  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  Discussed his polypharmacy which is not ideal but has been necessary to control the mood cycling and now his depression has improved.  Plus he also has ADD that has  to be managed. Answered questions about LT risk with psych meds.  He is satisfied with his ADD management with [name].  Discussed working on work life balance bc problems there. Cont counseling.  Better with Reduced Vraylar re: flatness is better.  1.5 mg every other day. No clear indications for med changes.  No reduction until less stressful job. Continue [name] , Depakote ER 1000, Cytomel 37.5, lithium 600, Trazodone  hs  PE with Dr. Shelia Media November 2022 for labs. Check lithium level, TSH, D Can't see these labs.  FU 3-4 months.    Lynder Parents, MD, DFAPA   Please see After Visit Summary for patient specific instructions.  No future appointments.   No orders of the defined types were placed in this encounter.      -------------------------------

## 2021-08-12 DIAGNOSIS — F900 Attention-deficit hyperactivity disorder, predominantly inattentive type: Secondary | ICD-10-CM | POA: Diagnosis not present

## 2021-08-12 DIAGNOSIS — F341 Dysthymic disorder: Secondary | ICD-10-CM | POA: Diagnosis not present

## 2021-08-19 DIAGNOSIS — F341 Dysthymic disorder: Secondary | ICD-10-CM | POA: Diagnosis not present

## 2021-08-19 DIAGNOSIS — F900 Attention-deficit hyperactivity disorder, predominantly inattentive type: Secondary | ICD-10-CM | POA: Diagnosis not present

## 2021-08-24 DIAGNOSIS — Z20822 Contact with and (suspected) exposure to covid-19: Secondary | ICD-10-CM | POA: Diagnosis not present

## 2021-08-25 ENCOUNTER — Telehealth: Payer: Self-pay | Admitting: Psychiatry

## 2021-08-25 NOTE — Telephone Encounter (Signed)
Pt called and said that the vyvanse needs a PA. He is out of his medicine. Please work on this ASAP

## 2021-08-25 NOTE — Telephone Encounter (Signed)
Prior Authorization submitted and approved for [name] 70 MG effective 07/26/2021-08/25/2022 with [facility] PA# [id number].  ID# [id number]

## 2021-08-26 DIAGNOSIS — F341 Dysthymic disorder: Secondary | ICD-10-CM | POA: Diagnosis not present

## 2021-08-26 DIAGNOSIS — F900 Attention-deficit hyperactivity disorder, predominantly inattentive type: Secondary | ICD-10-CM | POA: Diagnosis not present

## 2021-09-02 DIAGNOSIS — F900 Attention-deficit hyperactivity disorder, predominantly inattentive type: Secondary | ICD-10-CM | POA: Diagnosis not present

## 2021-09-02 DIAGNOSIS — F341 Dysthymic disorder: Secondary | ICD-10-CM | POA: Diagnosis not present

## 2021-09-09 DIAGNOSIS — F900 Attention-deficit hyperactivity disorder, predominantly inattentive type: Secondary | ICD-10-CM | POA: Diagnosis not present

## 2021-09-09 DIAGNOSIS — F341 Dysthymic disorder: Secondary | ICD-10-CM | POA: Diagnosis not present

## 2021-09-23 DIAGNOSIS — K219 Gastro-esophageal reflux disease without esophagitis: Secondary | ICD-10-CM | POA: Diagnosis not present

## 2021-09-23 DIAGNOSIS — F900 Attention-deficit hyperactivity disorder, predominantly inattentive type: Secondary | ICD-10-CM | POA: Diagnosis not present

## 2021-09-23 DIAGNOSIS — F341 Dysthymic disorder: Secondary | ICD-10-CM | POA: Diagnosis not present

## 2021-09-23 DIAGNOSIS — I700 Atherosclerosis of aorta: Secondary | ICD-10-CM | POA: Diagnosis not present

## 2021-09-27 ENCOUNTER — Other Ambulatory Visit: Payer: Self-pay

## 2021-09-27 DIAGNOSIS — I700 Atherosclerosis of aorta: Secondary | ICD-10-CM

## 2021-10-21 DIAGNOSIS — F341 Dysthymic disorder: Secondary | ICD-10-CM | POA: Diagnosis not present

## 2021-10-21 DIAGNOSIS — F900 Attention-deficit hyperactivity disorder, predominantly inattentive type: Secondary | ICD-10-CM | POA: Diagnosis not present

## 2021-10-24 ENCOUNTER — Other Ambulatory Visit: Payer: Self-pay | Admitting: Psychiatry

## 2021-10-24 DIAGNOSIS — F319 Bipolar disorder, unspecified: Secondary | ICD-10-CM

## 2021-10-28 DIAGNOSIS — F900 Attention-deficit hyperactivity disorder, predominantly inattentive type: Secondary | ICD-10-CM | POA: Diagnosis not present

## 2021-10-28 DIAGNOSIS — F341 Dysthymic disorder: Secondary | ICD-10-CM | POA: Diagnosis not present

## 2021-11-01 DIAGNOSIS — R519 Headache, unspecified: Secondary | ICD-10-CM | POA: Diagnosis not present

## 2021-11-01 DIAGNOSIS — R051 Acute cough: Secondary | ICD-10-CM | POA: Diagnosis not present

## 2021-11-01 DIAGNOSIS — J3489 Other specified disorders of nose and nasal sinuses: Secondary | ICD-10-CM | POA: Diagnosis not present

## 2021-11-01 DIAGNOSIS — H938X3 Other specified disorders of ear, bilateral: Secondary | ICD-10-CM | POA: Diagnosis not present

## 2021-11-04 DIAGNOSIS — F900 Attention-deficit hyperactivity disorder, predominantly inattentive type: Secondary | ICD-10-CM | POA: Diagnosis not present

## 2021-11-04 DIAGNOSIS — F341 Dysthymic disorder: Secondary | ICD-10-CM | POA: Diagnosis not present

## 2021-11-18 DIAGNOSIS — F341 Dysthymic disorder: Secondary | ICD-10-CM | POA: Diagnosis not present

## 2021-11-18 DIAGNOSIS — F900 Attention-deficit hyperactivity disorder, predominantly inattentive type: Secondary | ICD-10-CM | POA: Diagnosis not present

## 2021-11-19 ENCOUNTER — Other Ambulatory Visit: Payer: Self-pay | Admitting: Psychiatry

## 2021-11-19 ENCOUNTER — Telehealth: Payer: Self-pay | Admitting: Psychiatry

## 2021-11-19 DIAGNOSIS — F902 Attention-deficit hyperactivity disorder, combined type: Secondary | ICD-10-CM

## 2021-11-19 MED ORDER — LISDEXAMFETAMINE DIMESYLATE 70 MG PO CAPS: 70.0000 mg | ORAL_CAPSULE | Freq: Every day | ORAL | 0 refills | Status: DC

## 2021-11-19 NOTE — Telephone Encounter (Signed)
Pt requesting RF for [name] 70 mg #90 [facility] [address]. A[address] 5/31 ?

## 2021-11-25 DIAGNOSIS — F341 Dysthymic disorder: Secondary | ICD-10-CM | POA: Diagnosis not present

## 2021-11-25 DIAGNOSIS — F900 Attention-deficit hyperactivity disorder, predominantly inattentive type: Secondary | ICD-10-CM | POA: Diagnosis not present

## 2021-12-05 ENCOUNTER — Other Ambulatory Visit: Payer: Self-pay | Admitting: Psychiatry

## 2021-12-05 DIAGNOSIS — F319 Bipolar disorder, unspecified: Secondary | ICD-10-CM

## 2021-12-06 NOTE — Telephone Encounter (Signed)
?   Still taking, last filled 1/26 for 90 days.

## 2021-12-08 ENCOUNTER — Encounter: Payer: Self-pay | Admitting: Psychiatry

## 2021-12-08 ENCOUNTER — Ambulatory Visit (INDEPENDENT_AMBULATORY_CARE_PROVIDER_SITE_OTHER): Payer: BC Managed Care – PPO | Admitting: Psychiatry

## 2021-12-08 VITALS — BP 138/81 | HR 82

## 2021-12-08 DIAGNOSIS — F411 Generalized anxiety disorder: Secondary | ICD-10-CM | POA: Diagnosis not present

## 2021-12-08 DIAGNOSIS — F319 Bipolar disorder, unspecified: Secondary | ICD-10-CM

## 2021-12-08 DIAGNOSIS — F5105 Insomnia due to other mental disorder: Secondary | ICD-10-CM | POA: Diagnosis not present

## 2021-12-08 DIAGNOSIS — G2581 Restless legs syndrome: Secondary | ICD-10-CM

## 2021-12-08 DIAGNOSIS — R7989 Other specified abnormal findings of blood chemistry: Secondary | ICD-10-CM

## 2021-12-08 DIAGNOSIS — G4733 Obstructive sleep apnea (adult) (pediatric): Secondary | ICD-10-CM

## 2021-12-08 DIAGNOSIS — Z79899 Other long term (current) drug therapy: Secondary | ICD-10-CM

## 2021-12-08 NOTE — Progress Notes (Signed)
Zachary Dixon [id number] 06-Feb-1966 56 y.o.   Subjective:   Patient ID:  Zachary Dixon is a 56 y.o. (DOB 07/13/1965) male.  Chief Complaint:  Chief Complaint  Patient presents with   Follow-up    Bipolar I disorder with depression ([place])   Depression   Anxiety   ADD    Depression        Associated symptoms include no decreased concentration, no fatigue and no suicidal ideas.  Past medical history includes anxiety.   Anxiety Symptoms include nervous/anxious behavior. Patient reports no confusion, decreased concentration, nausea or suicidal ideas.    Medication Refill Pertinent negatives include no fatigue, nausea, sore throat or weakness.  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    at visit October 11, 2018.  Cytomel potentiation was started for depression 25 mcg daily to increase to 50 mcg daily.  In addition there were concerns about flatness and he was wondering if it is related to side effects versus depression.  We reduced the lithium temporarily to see if that was the cause of the flatness. Reductions in lithium and depakote did not help the flatness.  No noticeable effect from the Cytomel except it did help the thyroid panel.  At visit Dec 06, 2018.  [name] was switched to Mydayis to try to improve his function full before waking day. Felt it was less effective than [name].  Didn't feel it kick in and no longer duration at maximum 50 mg so returned to [name]. It's better.   Patient was  seen August 2020.  Because his TSH was suppressed we reduced Cytomel to 37.5 mcg daily.  Because of flatness we reduced the Vraylar to 1.5 mg daily to see if there could be improvement.  Patient seen December 2020.  No med changes were made.  08/28/19 seen with wife, Zachary Dixon Has been under more stress at work for 3 mos.  Not markedly depressed but falls asleep easily in evening and gets  tired easily.  Sleeping well with CPAP but may not get to bed early enough.  Falls asleep in chair and then gets to CPAP.  Not enough sleep on nights he has to travel.  Wife concerns.  Doesn't seem happy or interactive.  Used to have good days.  Can be irritable but no anger outbursts.  No mood swings.  No sense of humor which had been there before.  Still anxious and holding it in. We decided to reduce Vraylar to 1.5 mg every other day to see if flatness was improved.  Mood lability was under control.  As of 10/17/19:  Better.  Lighter and more energy.  Wife agrees.  Not depressed lately.  Not even small downturns.  Very even.   Zachary Dixon notices better engagement.  Anxiety still there with work.  Big projects coming to close in next month.  Everyone at work overwhelmed.  Would love to have another job.  She says he's always been stressed by every job. Plans to look elsewhere.   100% use status of CPAP. Compliant with meds.  No med concerns. Plan: Better with Reduced Vraylar re: flatness is better.  1.5 mg every other day. No med changes today  01/09/2020 appointment with the following noted: Consistent with meds.  Pretty good without complaints. Still has prominent gag reflex but has had it his whole life but seems worse than in years past.  Makes it even  hard to wear CPAP.  Not wearing it 8 hours but 5-6 hours now and 2-3 weeks ago wasn't wearing it.  Feels much better after not wearing it. Getting 8 hours sleep per night. Mood, anxiety and focus steady otherwise. No SE with meds. Plan Rec trial with NAC for cognition and mood.  Disc in detail  06/15/2020 appointment with the following noted: Zachary Dixon dx breast CA, stage 1.  No chemo planned.  She's dealing with it pretty well. Work remained stressful.  Lost 3 people but trying to keep perspective with wife's health issues.  Still pursuing another job but little time and energy for that. Mood very good overall.  Primary thing is stress and has to be careful  with it, otherwise anxiety can spend out of control.  Sleeping well. Endoscopy OK in workup with gagging problem.  Gagging interferes with CPAP use.  Can be worse with stress.  Can be difficult to convince doctor's a problem. PCP Dr. Shelia Media is good.  Overreactive gag reflex.  But sometimes can occur out of sleep.  Plan no med changes except again recommend he try NAC for cognitive reasons as noted before.  10/15/2020 appointment with following noted: Zachary Dixon doing well with treatment and finished tx. Still doing therapy.  Some struggle with depression.  Has interests and not as flat as years ago. Really tired and exhausted.  Clearly some related to job and searching. Still deals with gag reflex and sleep mask and sees ENT next week.  Never had problems with claustrophobia but problems with turtle necks, dentist, CPAP now .  Barely wear the CPAP. Doing DBT.  But not following through with all of it. Plan: No med changes Better with Reduced Vraylar re: flatness is better.  1.5 mg every other day.  02/18/2021 appointment with the following noted: Unsure if covid but had sx pneumonia a few mos ago with negative tests. Lately struggling and maybe bc not using CPAP bc mask phobic and still struggles with gagging even with Covid mask.  Has worked on it.  Has tried meditation to help the gagging. Very tired without [name].  Function at home affected by some more stress and conflict.  Chronic work stress may get better with changes and still searching for new job without success.  Spent after the work day.  Not present with family.  Zachary Dixon is upset.  Occ periods of despondency.   Couple weekends just walked off for 4 hours and didn't tell wife what he was doing and scared her.  Over did it. Can still enjoy and has interests. P;an: Better with Reduced Vraylar re: flatness is better.  1.5 mg every other day. No clear indications for med changes  04/29/21 appt noted: Big improvement with CPAP. Energy  better. Therapist helped with meditation at night.  Brain-spotting maybe helping with less gagging.  Most nights sleeping with the mask all night.  Pleased gagging better. Same job with a lot of stress.  Overall doing pretty well. Started men's DBT group at [facility] counseling. Pleased with Vraylar stopped mood swings. No recent mood swings. Except when forgot [name] for 3 days got depressed and couldn't function.. Patient reports stable mood and denies depressed or irritable moods.  Patient denies any recent difficulty with anxiety except work stress.  Patient denies difficulty with sleep initiation or maintenance. Denies appetite disturbance.  Patient reports that energy and motivation have been better with CPAP.  Patient denies any difficulty with concentration.  Patient denies any suicidal ideation.  08/05/21 appt noted: Mental health pretty good.  Still a lot of stress at work.  Trying to look for another job as goal.  Several years ago would have been stress and crushed but managing better. Minor mood swings and not depressed.  Tired and work 60 hours per week. No problems known with meds. Doesn't like CPAP DT gagging which is better and wants [facility]ire. Zachary Dixon doing well with breast CA. No problems with the meds. Sleep is OK except hard to get enough.  12/08/21 appt noted: Last few weeks a lot of stress and not managing well.  Zachary Dixon hosp with infection.  Lost her job.  $ stress. Usual work stress too. Kind of depression this weekend and not out of control.  Getting better.  Mood changes usually a few hours. CPAP mask broke and delay getting it.  Sleeep a problem for a couple of weeks. Zachary Dixon had Covid a couple of times and been worn down this year.   Zachary 56 yo and struggling emotionally. Still good interests and enjoyment.  Past psychiatric med medication trials include failure of ECT and TMS,  Latuda 120 mg,  aripiprazole, olanzapine 20 mg, Symbyax Seroquel, Rexulti, Vraylar 3 pramipexole,   lamotrigine, , Depakote 1500 ataxia,  lithium at high blood levels,  Pristiq, sertraline, bupropion 450 mg caused anxiety, Lexapro, Viibryd,  Deplin, Duloxetine, Trintellix, [name] 70, Mydayis Concerta 72 mg, History of some DBT  Review of Systems:  Review of Systems  Constitutional:  Negative for fatigue.  HENT:  Negative for sore throat.        Still gagging  Gastrointestinal:  Negative for nausea.       Gagging comes and goes ongoing  Neurological:  Negative for tremors and weakness.  Psychiatric/Behavioral:  Positive for dysphoric mood and sleep disturbance. Negative for agitation, behavioral problems, confusion, decreased concentration, hallucinations, self-injury and suicidal ideas. The patient is nervous/anxious. The patient is not hyperactive.    Medications: I have reviewed the patient's current medications.  Current Outpatient Medications  Medication Sig Dispense Refill   cariprazine (VRAYLAR) 1.5 MG capsule Take 1 capsule (1.5 mg total) by mouth daily. (Patient taking differently: Take 1.5 mg by mouth daily. Every other day) 30 capsule 5   divalproex (DEPAKOTE ER) 500 MG 24 hr tablet Take 2 tablets (1,000 mg total) by mouth at bedtime. 180 tablet 1   famotidine (PEPCID) 40 MG tablet TAKE 1 TABLET BY MOUTH EVERY DAY IN THE EVENING 90 tablet 0   L-methylfolate Calcium 15 MG TABS TAKE 1 TABLET BY MOUTH EVERY DAY 30 tablet 11   liothyronine (CYTOMEL) 25 MCG tablet TAKE 1 & 1/2 TABLETS BY MOUTH EVERY MORNING 135 tablet 0   lisdexamfetamine ([name]) 70 MG capsule Take 1 capsule (70 mg total) by mouth daily. 90 capsule 0   lithium carbonate (LITHOBID) 300 MG CR tablet Take 2 tablets (600 mg total) by mouth at bedtime. 180 tablet 1   pantoprazole (PROTONIX) 40 MG tablet Take 40 mg by mouth 2 (two) times daily.     traZODone (DESYREL) 50 MG tablet TAKE 1 TO 3 TABLETS BY MOUTH AT BEDTIME 270 tablet 0   Vitamin D, Ergocalciferol, (DRISDOL) 1.25 MG (50000 UNIT) CAPS capsule Take 1  capsule (50,000 Units total) by mouth every 7 (seven) days. 15 capsule 3   No current facility-administered medications for this visit.    Medication Side Effects: None  Tremor not gone but less  Allergies: No Known Allergies  Past Medical History:  Diagnosis Date  ADHD    Allergy    Anxiety    Bipolar 2 disorder ([place])    Depression    Diabetes mellitus without complication (HCC)    GERD (gastroesophageal reflux disease)    Hyperlipidemia    diet controlled   Sleep apnea    uses CPAP nightly   Thyroid disease     Family History  Problem Relation Age of Onset   Asthma Father    Emphysema Father    Heart disease Paternal Grandfather    Heart disease Paternal Grandmother    Breast cancer Maternal Grandmother    Colon cancer Maternal Grandfather    Rectal cancer Neg Hx    Stomach cancer Neg Hx    Esophageal cancer Neg Hx     Social History   Socioeconomic History   Marital status: Married    Spouse name: Not on file   Number of children: 1   Years of education: Not on file   Highest education level: Not on file  Occupational History   Occupation: marketing  Tobacco Use   Smoking status: Former    Packs/day: 1.00    Years: 7.00    Pack years: 7.00    Types: Cigarettes    Quit date: 07/11/1992    Years since quitting: 29.4   Smokeless tobacco: Never  Vaping Use   Vaping Use: Never used  Substance and Sexual Activity   Alcohol use: Yes    Alcohol/week: 4.0 standard drinks    Types: 4 Standard drinks or equivalent per week   Drug use: No   Sexual activity: Not Currently  Other Topics Concern   Not on file  Social History Narrative   Not on file   Social Determinants of Health   Financial Resource Strain: Not on file  Food Insecurity: Not on file  Transportation Needs: Not on file  Physical Activity: Not on file  Stress: Not on file  Social Connections: Not on file  Intimate Partner Violence: Not on file    Past Medical History, Surgical history,  Social history, and Family history were reviewed and updated as appropriate.   Zachary Dixon.  Please see review of systems for further details on the patient's review from today.   Objective:   Physical Exam:  BP 138/81   Pulse 82   Physical Exam Constitutional:      General: He is not in acute distress.    Appearance: Normal appearance.  Musculoskeletal:        General: No deformity.  Neurological:     Mental Status: He is alert and oriented to person, place, and time.     Cranial Nerves: No dysarthria.     Coordination: Coordination normal.  Psychiatric:        Attention and Perception: Attention and perception normal. He does not perceive auditory or visual hallucinations.        Mood and Affect: Mood is anxious and depressed. Affect is not labile, blunt, angry or inappropriate.        Speech: Speech normal.        Behavior: Behavior normal. Behavior is cooperative.        Thought Content: Thought content normal. Thought content is not paranoid or delusional. Thought content does not include homicidal or suicidal ideation. Thought content does not include suicidal plan.        Cognition and Memory: Cognition and memory normal.        Judgment: Judgment normal.     Comments: Insight intact  Lab Review:     Component Value Date/Time   NA 141 08/26/2016 1959   K 4.0 08/26/2016 1959   CL 112 (H) 08/26/2016 1959   CO2 24 08/26/2016 1959   GLUCOSE 106 (H) 08/26/2016 1959   BUN 19 08/26/2016 1959   CREATININE 1.06 08/26/2016 1959   CALCIUM 8.8 (L) 08/26/2016 1959   PROT 6.7 08/26/2016 1959   ALBUMIN 3.8 08/26/2016 1959   AST 18 08/26/2016 1959   ALT 21 08/26/2016 1959   ALKPHOS 60 08/26/2016 1959   BILITOT 0.6 08/26/2016 1959   GFRNONAA >60 08/26/2016 1959   GFRAA >60 08/26/2016 1959       Component Value Date/Time   WBC 8.6 08/26/2016 1959   RBC 4.35 08/26/2016 1959   HGB 13.7 08/26/2016 1959   HCT 39.8 08/26/2016 1959   PLT 221 08/26/2016 1959   MCV 91.5  08/26/2016 1959   MCH 31.5 08/26/2016 1959   MCHC 34.4 08/26/2016 1959   RDW 12.6 08/26/2016 1959   LYMPHSABS 2.4 08/26/2016 1959   MONOABS 0.4 08/26/2016 1959   EOSABS 0.2 08/26/2016 1959   BASOSABS 0.0 08/26/2016 1959    Lithium Lvl  Date Value Ref Range Status  04/02/2018 0.7 0.6 - 1.2 mmol/L Final     Lab Results  Component Value Date   VALPROATE 54.6 04/02/2018    TSH at 3   Normal ammonia 47 in 2019.  .res Assessment: Plan:    Bipolar I disorder with depression ([place])  Generalized anxiety disorder  Insomnia due to mental condition  Restless legs syndrome  Obstructive sleep apnea  Lithium use  Low vitamin D level   Greater than 50% of face to face time with patient was spent on counseling and coordination of care. 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  His symptoms have been severe and very resistant to any benefit.    He cannot tolerate an increase in the Depakote based on prior ataxic experiences at 1500 mg daily.  Higher doses of lithium have not helped anymore.  He has failed multiple medications and ECT and [place]. HowEver the flatness is much improved with the reduction in in Vraylar to 1.5 mg every other day.  He has had no worsening anxiety, mood swings nor depression.  Gagging appears phobic and  not med related.  Can occur before or after asleep. Med changes have not helped. Can experimenet with trazodone to see if it will help better with different doses.  He was not having extreme cycling like he did in the past.  We previously had reduced lithium to 600 mg daily and reduce Depakote to 500 mg daily so those were not changed. Overall his mood is worse a bit and may be due to reduced use of CPAP bc gagging.  He has been less and less compliant with his CPAP lately due to gagging problem to be evaluatied by ENT in Jan.  More tired. Xanax 0.5 mg HS  stopped bc didn't help gagging. No indication that psych meds causing tiredness. Disc Inspire rec look into it. Marland Kitchen RLS managed.  Pramipexole prn for RLS this is managed at this time.  Continue vitamin D check level later.  Continue B12. Extensive discussions about possible supplements that could be helpful.  He will continue the following. B-Complex 150 Still rec N-Acetylcysteine at 1000-1200 mg daily to help with mild cognitive problems and depression.  It can  be combined with a B-complex vitamin as the B-12 and folate have been shown to sometimes enhance the effect. L-methylfolate 15 mg daily for depression.  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  Discussed his polypharmacy which is not ideal but has been necessary to control the mood cycling and now his depression has improved.  Plus he also has ADD that has to be managed. Answered questions about LT risk with psych meds.  He is satisfied with his ADD management with [name].  Discussed working on work life balance bc problems there. Cont counseling. Disc relationship between grief and depression and classical condition.  Better with Reduced Vraylar re: flatness is better.  1.5 mg every other day. No clear indications for med changes.  No reduction until less stressful job. Continue [name] , Depakote ER 1000, Cytomel 37.5, lithium 600, Trazodone  hs  PE with Dr. Shelia Media November 2022 for labs. Check lithium level, TSH, D Can't see these labs.  FU 3-4 months.    Lynder Parents, MD, DFAPA   Please see After Visit Summary for patient specific instructions.  No future appointments.   No orders of the defined types were placed in this encounter.      -------------------------------

## 2021-12-09 DIAGNOSIS — F341 Dysthymic disorder: Secondary | ICD-10-CM | POA: Diagnosis not present

## 2021-12-09 DIAGNOSIS — F900 Attention-deficit hyperactivity disorder, predominantly inattentive type: Secondary | ICD-10-CM | POA: Diagnosis not present

## 2021-12-16 DIAGNOSIS — F900 Attention-deficit hyperactivity disorder, predominantly inattentive type: Secondary | ICD-10-CM | POA: Diagnosis not present

## 2021-12-16 DIAGNOSIS — F341 Dysthymic disorder: Secondary | ICD-10-CM | POA: Diagnosis not present

## 2021-12-23 DIAGNOSIS — F900 Attention-deficit hyperactivity disorder, predominantly inattentive type: Secondary | ICD-10-CM | POA: Diagnosis not present

## 2021-12-23 DIAGNOSIS — F341 Dysthymic disorder: Secondary | ICD-10-CM | POA: Diagnosis not present

## 2021-12-30 DIAGNOSIS — F341 Dysthymic disorder: Secondary | ICD-10-CM | POA: Diagnosis not present

## 2021-12-30 DIAGNOSIS — F900 Attention-deficit hyperactivity disorder, predominantly inattentive type: Secondary | ICD-10-CM | POA: Diagnosis not present

## 2022-01-06 DIAGNOSIS — F900 Attention-deficit hyperactivity disorder, predominantly inattentive type: Secondary | ICD-10-CM | POA: Diagnosis not present

## 2022-01-06 DIAGNOSIS — F341 Dysthymic disorder: Secondary | ICD-10-CM | POA: Diagnosis not present

## 2022-01-20 DIAGNOSIS — F900 Attention-deficit hyperactivity disorder, predominantly inattentive type: Secondary | ICD-10-CM | POA: Diagnosis not present

## 2022-01-20 DIAGNOSIS — F341 Dysthymic disorder: Secondary | ICD-10-CM | POA: Diagnosis not present

## 2022-02-03 DIAGNOSIS — F341 Dysthymic disorder: Secondary | ICD-10-CM | POA: Diagnosis not present

## 2022-02-03 DIAGNOSIS — F900 Attention-deficit hyperactivity disorder, predominantly inattentive type: Secondary | ICD-10-CM | POA: Diagnosis not present

## 2022-02-10 DIAGNOSIS — F900 Attention-deficit hyperactivity disorder, predominantly inattentive type: Secondary | ICD-10-CM | POA: Diagnosis not present

## 2022-02-10 DIAGNOSIS — F341 Dysthymic disorder: Secondary | ICD-10-CM | POA: Diagnosis not present

## 2022-02-17 DIAGNOSIS — F900 Attention-deficit hyperactivity disorder, predominantly inattentive type: Secondary | ICD-10-CM | POA: Diagnosis not present

## 2022-02-17 DIAGNOSIS — F341 Dysthymic disorder: Secondary | ICD-10-CM | POA: Diagnosis not present

## 2022-02-18 ENCOUNTER — Telehealth: Payer: Self-pay | Admitting: Psychiatry

## 2022-02-18 NOTE — Telephone Encounter (Signed)
Next visit is 03/10/22. Requesting a refill on [name] 70 mg called to:  [id number] [place], [place] - [address] Phone:  [phone] Fax:  [phone]

## 2022-02-18 NOTE — Telephone Encounter (Signed)
Last filled 5/16 for #90, pended for 8/12.

## 2022-02-21 ENCOUNTER — Other Ambulatory Visit: Payer: Self-pay | Admitting: Psychiatry

## 2022-02-21 MED ORDER — LISDEXAMFETAMINE DIMESYLATE 50 MG PO CAPS
50.0000 mg | ORAL_CAPSULE | Freq: Every day | ORAL | 0 refills | Status: DC
Start: 2022-02-21 — End: 2022-07-07

## 2022-02-21 MED ORDER — LISDEXAMFETAMINE DIMESYLATE 20 MG PO CAPS: 20.0000 mg | ORAL_CAPSULE | Freq: Every day | ORAL | 0 refills | Status: DC

## 2022-02-21 NOTE — Telephone Encounter (Signed)
LVM with info and to call back if he can't get meds

## 2022-02-21 NOTE — Telephone Encounter (Signed)
Insurance probably won't cover both please advise

## 2022-02-21 NOTE — Telephone Encounter (Signed)
Zachary Dixon called because Zachary Dixon was told to check around to make sure his pharmacy had the vyvanse.  Zachary Dixon did find it at [facility] on [address]  [name] they only '40mg'$  and '30mg'$ .  Zachary Dixon normally takes '70mg'$  so if insurance will cover the 40 and the 30 please send that in.  If it won't cover both, the '40mg'$  is better than none

## 2022-02-21 NOTE — Telephone Encounter (Signed)
I sent 50 and 20 instead so he could at least get 50 instead of 40 mg worst case.  Hopefully he can get the 20 mg filled with it.

## 2022-02-24 DIAGNOSIS — F900 Attention-deficit hyperactivity disorder, predominantly inattentive type: Secondary | ICD-10-CM | POA: Diagnosis not present

## 2022-02-24 DIAGNOSIS — F341 Dysthymic disorder: Secondary | ICD-10-CM | POA: Diagnosis not present

## 2022-03-10 ENCOUNTER — Encounter: Payer: Self-pay | Admitting: Psychiatry

## 2022-03-10 ENCOUNTER — Ambulatory Visit (INDEPENDENT_AMBULATORY_CARE_PROVIDER_SITE_OTHER): Payer: BC Managed Care – PPO | Admitting: Psychiatry

## 2022-03-10 VITALS — BP 137/82 | HR 88

## 2022-03-10 DIAGNOSIS — F411 Generalized anxiety disorder: Secondary | ICD-10-CM

## 2022-03-10 DIAGNOSIS — G2581 Restless legs syndrome: Secondary | ICD-10-CM

## 2022-03-10 DIAGNOSIS — F900 Attention-deficit hyperactivity disorder, predominantly inattentive type: Secondary | ICD-10-CM | POA: Diagnosis not present

## 2022-03-10 DIAGNOSIS — F341 Dysthymic disorder: Secondary | ICD-10-CM | POA: Diagnosis not present

## 2022-03-10 DIAGNOSIS — G4733 Obstructive sleep apnea (adult) (pediatric): Secondary | ICD-10-CM

## 2022-03-10 DIAGNOSIS — F902 Attention-deficit hyperactivity disorder, combined type: Secondary | ICD-10-CM

## 2022-03-10 DIAGNOSIS — F5105 Insomnia due to other mental disorder: Secondary | ICD-10-CM | POA: Diagnosis not present

## 2022-03-10 DIAGNOSIS — R7989 Other specified abnormal findings of blood chemistry: Secondary | ICD-10-CM

## 2022-03-10 DIAGNOSIS — Z79899 Other long term (current) drug therapy: Secondary | ICD-10-CM

## 2022-03-10 DIAGNOSIS — F319 Bipolar disorder, unspecified: Secondary | ICD-10-CM

## 2022-03-10 NOTE — Progress Notes (Signed)
Zachary Dixon [id number] 1966/04/23 56 y.o.   Subjective:   Patient ID:  Zachary Dixon is a 56 y.o. (DOB 24-Oct-1965) male.  Chief Complaint:  Chief Complaint  Patient presents with   Follow-up    Bipolar I disorder with depression ([place])   Depression   Anxiety   ADD    Depression        Associated symptoms include no decreased concentration, no fatigue and no suicidal ideas.  Past medical history includes anxiety.   Anxiety Patient reports no confusion, decreased concentration, nausea, nervous/anxious behavior or suicidal ideas.    Medication Refill Pertinent negatives include no fatigue, nausea, sore throat or weakness.   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    at visit October 11, 2018.  Cytomel potentiation was started for depression 25 mcg daily to increase to 50 mcg daily.  In addition there were concerns about flatness and he was wondering if it is related to side effects versus depression.  We reduced the lithium temporarily to see if that was the cause of the flatness. Reductions in lithium and depakote did not help the flatness.  No noticeable effect from the Cytomel except it did help the thyroid panel.  At visit Dec 06, 2018.  [name] was switched to Mydayis to try to improve his function full before waking day. Felt it was less effective than [name].  Didn't feel it kick in and no longer duration at maximum 50 mg so returned to [name]. It's better.   Patient was  seen August 2020.  Because his TSH was suppressed we reduced Cytomel to 37.5 mcg daily.  Because of flatness we reduced the Vraylar to 1.5 mg daily to see if there could be improvement.  Patient seen December 2020.  No med changes were made.  08/28/19 seen with wife, Zachary Dixon Has been under more stress at work for 3 mos.  Not markedly depressed but falls asleep easily in evening and gets tired easily.   Sleeping well with CPAP but may not get to bed early enough.  Falls asleep in chair and then gets to CPAP.  Not enough sleep on nights he has to travel.  Wife concerns.  Doesn't seem happy or interactive.  Used to have good days.  Can be irritable but no anger outbursts.  No mood swings.  No sense of humor which had been there before.  Still anxious and holding it in. We decided to reduce Vraylar to 1.5 mg every other day to see if flatness was improved.  Mood lability was under control.  As of 10/17/19:  Better.  Lighter and more energy.  Wife agrees.  Not depressed lately.  Not even small downturns.  Very even.   Zachary Dixon notices better engagement.  Anxiety still there with work.  Big projects coming to close in next month.  Everyone at work overwhelmed.  Would love to have another job.  She says he's always been stressed by every job. Plans to look elsewhere.   100% use status of CPAP. Compliant with meds.  No med concerns. Plan: Better with Reduced Vraylar re: flatness is better.  1.5 mg every other day. No med changes today  01/09/2020 appointment with the following noted: Consistent with meds.  Pretty good without complaints. Still has prominent gag reflex but has had it his whole life but seems worse than in years past.  Makes it even hard  to wear CPAP.  Not wearing it 8 hours but 5-6 hours now and 2-3 weeks ago wasn't wearing it.  Feels much better after not wearing it. Getting 8 hours sleep per night. Mood, anxiety and focus steady otherwise. No SE with meds. Plan Rec trial with NAC for cognition and mood.  Disc in detail  06/15/2020 appointment with the following noted: Nan dx breast CA, stage 1.  No chemo planned.  She's dealing with it pretty well. Work remained stressful.  Lost 3 people but trying to keep perspective with wife's health issues.  Still pursuing another job but little time and energy for that. Mood very good overall.  Primary thing is stress and has to be careful with it,  otherwise anxiety can spend out of control.  Sleeping well. Endoscopy OK in workup with gagging problem.  Gagging interferes with CPAP use.  Can be worse with stress.  Can be difficult to convince doctor's a problem. PCP Dr. Shelia Media is good.  Overreactive gag reflex.  But sometimes can occur out of sleep.  Plan no med changes except again recommend he try NAC for cognitive reasons as noted before.  10/15/2020 appointment with following noted: Zachary Dixon doing well with treatment and finished tx. Still doing therapy.  Some struggle with depression.  Has interests and not as flat as years ago. Really tired and exhausted.  Clearly some related to job and searching. Still deals with gag reflex and sleep mask and sees ENT next week.  Never had problems with claustrophobia but problems with turtle necks, dentist, CPAP now .  Barely wear the CPAP. Doing DBT.  But not following through with all of it. Plan: No med changes Better with Reduced Vraylar re: flatness is better.  1.5 mg every other day.  02/18/2021 appointment with the following noted: Unsure if covid but had sx pneumonia a few mos ago with negative tests. Lately struggling and maybe bc not using CPAP bc mask phobic and still struggles with gagging even with Covid mask.  Has worked on it.  Has tried meditation to help the gagging. Very tired without [name].  Function at home affected by some more stress and conflict.  Chronic work stress may get better with changes and still searching for new job without success.  Spent after the work day.  Not present with family.  Zachary Dixon is upset.  Occ periods of despondency.   Couple weekends just walked off for 4 hours and didn't tell wife what he was doing and scared her.  Over did it. Can still enjoy and has interests. P;an: Better with Reduced Vraylar re: flatness is better.  1.5 mg every other day. No clear indications for med changes  04/29/21 appt noted: Big improvement with CPAP. Energy better. Therapist  helped with meditation at night.  Brain-spotting maybe helping with less gagging.  Most nights sleeping with the mask all night.  Pleased gagging better. Same job with a lot of stress.  Overall doing pretty well. Started men's DBT group at [facility] counseling. Pleased with Vraylar stopped mood swings. No recent mood swings. Except when forgot [name] for 3 days got depressed and couldn't function.. Patient reports stable mood and denies depressed or irritable moods.  Patient denies any recent difficulty with anxiety except work stress.  Patient denies difficulty with sleep initiation or maintenance. Denies appetite disturbance.  Patient reports that energy and motivation have been better with CPAP.  Patient denies any difficulty with concentration.  Patient denies any suicidal ideation.  08/05/21  appt noted: Mental health pretty good.  Still a lot of stress at work.  Trying to look for another job as goal.  Several years ago would have been stress and crushed but managing better. Minor mood swings and not depressed.  Tired and work 60 hours per week. No problems known with meds. Doesn't like CPAP DT gagging which is better and wants Inspire. Zachary Dixon doing well with breast CA. No problems with the meds. Sleep is OK except hard to get enough.  12/08/21 appt noted: Last few weeks a lot of stress and not managing well.  Nan hosp with infection.  Lost her job.  $ stress. Usual work stress too. Kind of depression this weekend and not out of control.  Getting better.  Mood changes usually a few hours. CPAP mask broke and delay getting it.  Sleeep a problem for a couple of weeks. Nan had Covid a couple of times and been worn down this year.   Son 52 yo and struggling emotionally. Still good interests and enjoyment.  03/10/22 appt noted:  Disc shortage of [name] but on 70 mg daily. Doing well.  With mood. Son off to college and going well.   Major work deadline met in August and less demand so time to  look for new job now.  Was 2 year project. Satisfied with meds. Sleep not too bad. Wants to look into Inspire but is using CPAP. Less anxious. Improving exercise.  Past psychiatric med medication trials include failure of ECT and TMS,  Latuda 120 mg,  aripiprazole, olanzapine 20 mg, Symbyax Seroquel, Rexulti, Vraylar 3 pramipexole,  lamotrigine, , Depakote 1500 ataxia,  lithium at high blood levels,  Pristiq, sertraline, bupropion 450 mg caused anxiety, Lexapro, Viibryd,  Deplin, Duloxetine, Trintellix, [name] 70, Mydayis Concerta 72 mg, History of some DBT  Review of Systems:  Review of Systems  Constitutional:  Negative for fatigue.  HENT:  Negative for sore throat.        Still gagging  Gastrointestinal:  Negative for nausea.       Gagging comes and goes ongoing  Neurological:  Negative for tremors and weakness.  Psychiatric/Behavioral:  Negative for agitation, behavioral problems, confusion, decreased concentration, dysphoric mood, hallucinations, self-injury, sleep disturbance and suicidal ideas. The patient is not nervous/anxious and is not hyperactive.     Medications: I have reviewed the patient's current medications.  Current Outpatient Medications  Medication Sig Dispense Refill   cariprazine (VRAYLAR) 1.5 MG capsule Take 1 capsule (1.5 mg total) by mouth daily. (Patient taking differently: Take 1.5 mg by mouth daily. Every other day) 30 capsule 5   divalproex (DEPAKOTE ER) 500 MG 24 hr tablet Take 2 tablets (1,000 mg total) by mouth at bedtime. 180 tablet 1   famotidine (PEPCID) 40 MG tablet TAKE 1 TABLET BY MOUTH EVERY DAY IN THE EVENING 90 tablet 0   L-methylfolate Calcium 15 MG TABS TAKE 1 TABLET BY MOUTH EVERY DAY 30 tablet 11   liothyronine (CYTOMEL) 25 MCG tablet TAKE 1 & 1/2 TABLETS BY MOUTH EVERY MORNING 135 tablet 0   lisdexamfetamine ([name]) 20 MG capsule Take 1 capsule (20 mg total) by mouth daily. 30 capsule 0   lisdexamfetamine ([name]) 50 MG capsule  Take 1 capsule (50 mg total) by mouth daily. 30 capsule 0   lithium carbonate (LITHOBID) 300 MG CR tablet Take 2 tablets (600 mg total) by mouth at bedtime. 180 tablet 1   pantoprazole (PROTONIX) 40 MG tablet Take 40 mg by mouth  2 (two) times daily.     traZODone (DESYREL) 50 MG tablet TAKE 1 TO 3 TABLETS BY MOUTH AT BEDTIME 270 tablet 0   Vitamin D, Ergocalciferol, (DRISDOL) 1.25 MG (50000 UNIT) CAPS capsule Take 1 capsule (50,000 Units total) by mouth every 7 (seven) days. 15 capsule 3   lisdexamfetamine ([name]) 70 MG capsule Take 1 capsule (70 mg total) by mouth daily. (Patient not taking: Reported on 03/10/2022) 90 capsule 0   No current facility-administered medications for this visit.    Medication Side Effects: None  Tremor not gone but less  Allergies: No Known Allergies  Past Medical History:  Diagnosis Date   ADHD    Allergy    Anxiety    Bipolar 2 disorder ([place])    Depression    Diabetes mellitus without complication (HCC)    GERD (gastroesophageal reflux disease)    Hyperlipidemia    diet controlled   Sleep apnea    uses CPAP nightly   Thyroid disease     Family History  Problem Relation Age of Onset   Asthma Father    Emphysema Father    Heart disease Paternal Grandfather    Heart disease Paternal Grandmother    Breast cancer Maternal Grandmother    Colon cancer Maternal Grandfather    Rectal cancer Neg Hx    Stomach cancer Neg Hx    Esophageal cancer Neg Hx     Social History   Socioeconomic History   Marital status: Married    Spouse name: Not on file   Number of children: 1   Years of education: Not on file   Highest education level: Not on file  Occupational History   Occupation: marketing  Tobacco Use   Smoking status: Former    Packs/day: 1.00    Years: 7.00    Total pack years: 7.00    Types: Cigarettes    Quit date: 07/11/1992    Years since quitting: 29.6   Smokeless tobacco: Never  Vaping Use   Vaping Use: Never used  Substance and  Sexual Activity   Alcohol use: Yes    Alcohol/week: 4.0 standard drinks of alcohol    Types: 4 Standard drinks or equivalent per week   Drug use: No   Sexual activity: Not Currently  Other Topics Concern   Not on file  Social History Narrative   Not on file   Social Determinants of Health   Financial Resource Strain: Not on file  Food Insecurity: Not on file  Transportation Needs: Not on file  Physical Activity: Not on file  Stress: Not on file  Social Connections: Not on file  Intimate Partner Violence: Not on file    Past Medical History, Surgical history, Social history, and Family history were reviewed and updated as appropriate.   Son sam.  Please see review of systems for further details on the patient's review from today.   Objective:   Physical Exam:  BP 137/82   Pulse 88   Physical Exam Constitutional:      General: He is not in acute distress.    Appearance: Normal appearance.  Musculoskeletal:        General: No deformity.  Neurological:     Mental Status: He is alert and oriented to person, place, and time.     Cranial Nerves: No dysarthria.     Coordination: Coordination normal.  Psychiatric:        Attention and Perception: Attention and perception normal. He does not perceive  auditory or visual hallucinations.        Mood and Affect: Mood is not anxious or depressed. Affect is not labile, blunt, angry or inappropriate.        Speech: Speech normal.        Behavior: Behavior normal. Behavior is cooperative.        Thought Content: Thought content normal. Thought content is not paranoid or delusional. Thought content does not include homicidal or suicidal ideation. Thought content does not include suicidal plan.        Cognition and Memory: Cognition and memory normal.        Judgment: Judgment normal.     Comments: Insight intact      Lab Review:     Component Value Date/Time   NA 141 08/26/2016 1959   K 4.0 08/26/2016 1959   CL 112 (H)  08/26/2016 1959   CO2 24 08/26/2016 1959   GLUCOSE 106 (H) 08/26/2016 1959   BUN 19 08/26/2016 1959   CREATININE 1.06 08/26/2016 1959   CALCIUM 8.8 (L) 08/26/2016 1959   PROT 6.7 08/26/2016 1959   ALBUMIN 3.8 08/26/2016 1959   AST 18 08/26/2016 1959   ALT 21 08/26/2016 1959   ALKPHOS 60 08/26/2016 1959   BILITOT 0.6 08/26/2016 1959   GFRNONAA >60 08/26/2016 1959   GFRAA >60 08/26/2016 1959       Component Value Date/Time   WBC 8.6 08/26/2016 1959   RBC 4.35 08/26/2016 1959   HGB 13.7 08/26/2016 1959   HCT 39.8 08/26/2016 1959   PLT 221 08/26/2016 1959   MCV 91.5 08/26/2016 1959   MCH 31.5 08/26/2016 1959   MCHC 34.4 08/26/2016 1959   RDW 12.6 08/26/2016 1959   LYMPHSABS 2.4 08/26/2016 1959   MONOABS 0.4 08/26/2016 1959   EOSABS 0.2 08/26/2016 1959   BASOSABS 0.0 08/26/2016 1959    Lithium Lvl  Date Value Ref Range Status  04/02/2018 0.7 0.6 - 1.2 mmol/L Final     Lab Results  Component Value Date   VALPROATE 54.6 04/02/2018    TSH at 3   Normal ammonia 47 in 2019.  .res Assessment: Plan:    Bipolar I disorder with depression ([place])  Generalized anxiety disorder  Insomnia due to mental condition  Restless legs syndrome  Obstructive sleep apnea  Lithium use  Low vitamin D level  Attention deficit hyperactivity disorder (ADHD), combined type   Greater than 50% of face to face time with patient was spent on counseling and coordination of care. 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  His symptoms have been severe and very resistant to any benefit.    He cannot tolerate an increase in the Depakote based on prior ataxic experiences at 1500 mg daily.  Higher doses of lithium have not helped anymore.  He has failed multiple medications and ECT and [place]. HowEver the flatness is much improved with the reduction in in Vraylar to 1.5 mg every other day.  He  has had no worsening anxiety, mood swings nor depression.  Gagging appears phobic and  not med related.  Can occur before or after asleep. Med changes have not helped. Can experimenet with trazodone to see if it will help better with different doses.  He was not having extreme cycling like he did in the past.  We previously had reduced lithium to 600 mg daily and reduce Depakote to 500 mg daily  so those were not changed. Overall his mood is worse a bit and may be due to reduced use of CPAP bc gagging.  He has been less and less compliant with his CPAP lately due to gagging problem to be evaluatied by ENT in Jan.  More tired.  No indication that psych meds causing tiredness. Disc Inspire rec look into it. Marland Kitchen RLS managed.  Pramipexole prn for RLS this is managed at this time.  Continue vitamin D check level later.  Continue B12. Extensive discussions about possible supplements that could be helpful.  He will continue the following. B-Complex 150 Still rec N-Acetylcysteine at 1000-1200 mg daily to help with mild cognitive problems and depression.  It can be combined with a B-complex vitamin as the B-12 and folate have been shown to sometimes enhance the effect. L-methylfolate 15 mg daily for depression.  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  Discussed his polypharmacy which is not ideal but has been necessary to control the mood cycling and now his depression has improved.  Plus he also has ADD that has to be managed. Answered questions about LT risk with psych meds.  Disc omeprazole chronic and potential cognitive risks  He is satisfied with his ADD management with [name].  Discussed working on work life balance bc problems there. Cont counseling. Disc relationship between grief and depression and classical condition.  Better with Reduced Vraylar re: flatness is better.  1.5 mg every other day. No clear indications for med changes.  No reduction until  less stressful job. Continue [name]  70 mg, Depakote ER 1000, Cytomel 37.5, lithium 600, Trazodone  hs  PE with Dr. Shelia Media November 2022 for labs. Check lithium level, TSH, D Can't see these labs.  FU 3-4 months.    Lynder Parents, MD, DFAPA   Please see After Visit Summary for patient specific instructions.  No future appointments.   No orders of the defined types were placed in this encounter.      -------------------------------

## 2022-03-13 ENCOUNTER — Other Ambulatory Visit: Payer: Self-pay | Admitting: Psychiatry

## 2022-03-13 DIAGNOSIS — F319 Bipolar disorder, unspecified: Secondary | ICD-10-CM

## 2022-03-21 ENCOUNTER — Telehealth: Payer: Self-pay | Admitting: Psychiatry

## 2022-03-21 NOTE — Telephone Encounter (Signed)
Patient prefers 70 mg. Aware that this dosage is hard to find, which is why we sent 2 different doses last month.

## 2022-03-21 NOTE — Telephone Encounter (Signed)
LVM. What dose? Patient usually takes 69 but due to shortage last month sent 82 and 20.

## 2022-03-21 NOTE — Telephone Encounter (Signed)
Pt requesting RF [name] [facility] [address]. [address] 12/28

## 2022-03-22 NOTE — Telephone Encounter (Signed)
Patient's pharmacy does not have in [place]. LVM to RC to decide next step.

## 2022-03-24 DIAGNOSIS — F341 Dysthymic disorder: Secondary | ICD-10-CM | POA: Diagnosis not present

## 2022-03-24 DIAGNOSIS — F900 Attention-deficit hyperactivity disorder, predominantly inattentive type: Secondary | ICD-10-CM | POA: Diagnosis not present

## 2022-03-25 ENCOUNTER — Telehealth: Payer: Self-pay | Admitting: Psychiatry

## 2022-03-25 ENCOUNTER — Other Ambulatory Visit: Payer: Self-pay

## 2022-03-25 DIAGNOSIS — F902 Attention-deficit hyperactivity disorder, combined type: Secondary | ICD-10-CM

## 2022-03-25 MED ORDER — LISDEXAMFETAMINE DIMESYLATE 70 MG PO CAPS: 70.0000 mg | ORAL_CAPSULE | Freq: Every day | ORAL | 0 refills | Status: DC

## 2022-03-25 NOTE — Telephone Encounter (Signed)
Called patient and told him some stock is starting to come in for generic and to let me know what pharmacy to send Rx to.

## 2022-03-25 NOTE — Telephone Encounter (Signed)
Error

## 2022-03-25 NOTE — Telephone Encounter (Signed)
Pended.

## 2022-03-31 ENCOUNTER — Encounter: Payer: Self-pay | Admitting: Gastroenterology

## 2022-03-31 DIAGNOSIS — F900 Attention-deficit hyperactivity disorder, predominantly inattentive type: Secondary | ICD-10-CM | POA: Diagnosis not present

## 2022-03-31 DIAGNOSIS — F341 Dysthymic disorder: Secondary | ICD-10-CM | POA: Diagnosis not present

## 2022-04-05 NOTE — Telephone Encounter (Signed)
Error

## 2022-04-07 DIAGNOSIS — F900 Attention-deficit hyperactivity disorder, predominantly inattentive type: Secondary | ICD-10-CM | POA: Diagnosis not present

## 2022-04-07 DIAGNOSIS — F341 Dysthymic disorder: Secondary | ICD-10-CM | POA: Diagnosis not present

## 2022-04-14 DIAGNOSIS — F900 Attention-deficit hyperactivity disorder, predominantly inattentive type: Secondary | ICD-10-CM | POA: Diagnosis not present

## 2022-04-14 DIAGNOSIS — F341 Dysthymic disorder: Secondary | ICD-10-CM | POA: Diagnosis not present

## 2022-04-25 ENCOUNTER — Telehealth: Payer: Self-pay | Admitting: Psychiatry

## 2022-04-25 ENCOUNTER — Other Ambulatory Visit: Payer: Self-pay

## 2022-04-25 DIAGNOSIS — F902 Attention-deficit hyperactivity disorder, combined type: Secondary | ICD-10-CM

## 2022-04-25 MED ORDER — LISDEXAMFETAMINE DIMESYLATE 70 MG PO CAPS: 70.0000 mg | ORAL_CAPSULE | Freq: Every day | ORAL | 0 refills | Status: DC

## 2022-04-25 NOTE — Telephone Encounter (Signed)
Pended.

## 2022-04-25 NOTE — Telephone Encounter (Signed)
Pt called and said that he needs a refill on his generic vyvanse 70 mg. The [facility] [address]

## 2022-05-02 ENCOUNTER — Encounter: Payer: Self-pay | Admitting: Gastroenterology

## 2022-05-05 DIAGNOSIS — F341 Dysthymic disorder: Secondary | ICD-10-CM | POA: Diagnosis not present

## 2022-05-05 DIAGNOSIS — F900 Attention-deficit hyperactivity disorder, predominantly inattentive type: Secondary | ICD-10-CM | POA: Diagnosis not present

## 2022-05-09 DIAGNOSIS — M5431 Sciatica, right side: Secondary | ICD-10-CM | POA: Diagnosis not present

## 2022-05-12 DIAGNOSIS — F900 Attention-deficit hyperactivity disorder, predominantly inattentive type: Secondary | ICD-10-CM | POA: Diagnosis not present

## 2022-05-12 DIAGNOSIS — F341 Dysthymic disorder: Secondary | ICD-10-CM | POA: Diagnosis not present

## 2022-05-24 ENCOUNTER — Telehealth: Payer: Self-pay | Admitting: Psychiatry

## 2022-05-24 NOTE — Telephone Encounter (Signed)
Patient has an Rx at the pharmacy for November and December. Notified him.

## 2022-05-24 NOTE — Telephone Encounter (Signed)
Patient lvm requesting a refill on the generic [name] due later this week. Follow up appointment scheduled for 07/07/22  Contact information [phone]

## 2022-05-28 ENCOUNTER — Other Ambulatory Visit: Payer: Self-pay | Admitting: Psychiatry

## 2022-05-28 DIAGNOSIS — R7989 Other specified abnormal findings of blood chemistry: Secondary | ICD-10-CM

## 2022-05-31 NOTE — Telephone Encounter (Signed)
Ok to send

## 2022-06-01 ENCOUNTER — Other Ambulatory Visit: Payer: Self-pay | Admitting: Psychiatry

## 2022-06-01 DIAGNOSIS — Z125 Encounter for screening for malignant neoplasm of prostate: Secondary | ICD-10-CM | POA: Diagnosis not present

## 2022-06-01 DIAGNOSIS — Z0000 Encounter for general adult medical examination without abnormal findings: Secondary | ICD-10-CM | POA: Diagnosis not present

## 2022-06-01 DIAGNOSIS — R7303 Prediabetes: Secondary | ICD-10-CM | POA: Diagnosis not present

## 2022-06-01 DIAGNOSIS — F319 Bipolar disorder, unspecified: Secondary | ICD-10-CM

## 2022-06-06 DIAGNOSIS — E039 Hypothyroidism, unspecified: Secondary | ICD-10-CM | POA: Diagnosis not present

## 2022-06-06 DIAGNOSIS — F319 Bipolar disorder, unspecified: Secondary | ICD-10-CM | POA: Diagnosis not present

## 2022-06-06 DIAGNOSIS — Z0000 Encounter for general adult medical examination without abnormal findings: Secondary | ICD-10-CM | POA: Diagnosis not present

## 2022-06-06 DIAGNOSIS — E7800 Pure hypercholesterolemia, unspecified: Secondary | ICD-10-CM | POA: Diagnosis not present

## 2022-06-06 DIAGNOSIS — K219 Gastro-esophageal reflux disease without esophagitis: Secondary | ICD-10-CM | POA: Diagnosis not present

## 2022-06-06 DIAGNOSIS — J309 Allergic rhinitis, unspecified: Secondary | ICD-10-CM | POA: Diagnosis not present

## 2022-06-06 DIAGNOSIS — Z23 Encounter for immunization: Secondary | ICD-10-CM | POA: Diagnosis not present

## 2022-06-09 DIAGNOSIS — F900 Attention-deficit hyperactivity disorder, predominantly inattentive type: Secondary | ICD-10-CM | POA: Diagnosis not present

## 2022-06-09 DIAGNOSIS — F341 Dysthymic disorder: Secondary | ICD-10-CM | POA: Diagnosis not present

## 2022-06-13 ENCOUNTER — Ambulatory Visit (AMBULATORY_SURGERY_CENTER): Payer: BC Managed Care – PPO | Admitting: *Deleted

## 2022-06-13 VITALS — Ht 73.0 in | Wt 226.0 lb

## 2022-06-13 DIAGNOSIS — Z86010 Personal history of colonic polyps: Secondary | ICD-10-CM

## 2022-06-13 MED ORDER — NA SULFATE-K SULFATE-MG SULF 17.5-3.13-1.6 GM/177ML PO SOLN: 1.0000 | Freq: Once | ORAL | 0 refills | Status: AC

## 2022-06-13 NOTE — Progress Notes (Signed)
No egg or soy allergy known to patient  No issues known to pt with past sedation with any surgeries or procedures Patient denies ever being told they had issues or difficulty with intubation  No FH of Malignant Hyperthermia Pt is not on diet pills Pt is not on  home 02  Pt is not on blood thinners  Pt denies issues with constipation  Pt encouraged to use to use Singlecare or Goodrx to reduce cost  Patient's chart reviewed by Osvaldo Angst CNRA prior to previsit and patient appropriate for the [place].  Previsit completed and red dot placed by patient's name on their procedure day (on provider's schedule).  . In person Coupon given

## 2022-06-14 ENCOUNTER — Encounter: Payer: BC Managed Care – PPO | Admitting: Gastroenterology

## 2022-06-15 DIAGNOSIS — F900 Attention-deficit hyperactivity disorder, predominantly inattentive type: Secondary | ICD-10-CM | POA: Diagnosis not present

## 2022-06-15 DIAGNOSIS — F341 Dysthymic disorder: Secondary | ICD-10-CM | POA: Diagnosis not present

## 2022-06-16 DIAGNOSIS — G4733 Obstructive sleep apnea (adult) (pediatric): Secondary | ICD-10-CM | POA: Diagnosis not present

## 2022-06-16 DIAGNOSIS — J310 Chronic rhinitis: Secondary | ICD-10-CM | POA: Diagnosis not present

## 2022-06-22 DIAGNOSIS — F900 Attention-deficit hyperactivity disorder, predominantly inattentive type: Secondary | ICD-10-CM | POA: Diagnosis not present

## 2022-06-22 DIAGNOSIS — F341 Dysthymic disorder: Secondary | ICD-10-CM | POA: Diagnosis not present

## 2022-06-29 ENCOUNTER — Encounter: Payer: BC Managed Care – PPO | Admitting: Gastroenterology

## 2022-06-30 DIAGNOSIS — F900 Attention-deficit hyperactivity disorder, predominantly inattentive type: Secondary | ICD-10-CM | POA: Diagnosis not present

## 2022-06-30 DIAGNOSIS — F341 Dysthymic disorder: Secondary | ICD-10-CM | POA: Diagnosis not present

## 2022-07-01 ENCOUNTER — Other Ambulatory Visit: Payer: Self-pay

## 2022-07-01 ENCOUNTER — Telehealth: Payer: Self-pay | Admitting: Psychiatry

## 2022-07-01 DIAGNOSIS — F902 Attention-deficit hyperactivity disorder, combined type: Secondary | ICD-10-CM

## 2022-07-01 MED ORDER — LISDEXAMFETAMINE DIMESYLATE 70 MG PO CAPS: 70.0000 mg | ORAL_CAPSULE | Freq: Every day | ORAL | 0 refills | Status: DC

## 2022-07-01 NOTE — Telephone Encounter (Signed)
Pt called at 10:33a.  He would like refill of generic [name] sent to [facility] at [facility].  It is available there.  Next appt 12/28

## 2022-07-01 NOTE — Telephone Encounter (Signed)
Pended.

## 2022-07-06 ENCOUNTER — Encounter: Payer: Self-pay | Admitting: Gastroenterology

## 2022-07-07 ENCOUNTER — Ambulatory Visit (INDEPENDENT_AMBULATORY_CARE_PROVIDER_SITE_OTHER): Payer: BC Managed Care – PPO | Admitting: Psychiatry

## 2022-07-07 ENCOUNTER — Encounter: Payer: Self-pay | Admitting: Psychiatry

## 2022-07-07 VITALS — BP 124/77 | HR 89

## 2022-07-07 DIAGNOSIS — F319 Bipolar disorder, unspecified: Secondary | ICD-10-CM

## 2022-07-07 DIAGNOSIS — F411 Generalized anxiety disorder: Secondary | ICD-10-CM | POA: Diagnosis not present

## 2022-07-07 DIAGNOSIS — F902 Attention-deficit hyperactivity disorder, combined type: Secondary | ICD-10-CM | POA: Diagnosis not present

## 2022-07-07 DIAGNOSIS — G2581 Restless legs syndrome: Secondary | ICD-10-CM

## 2022-07-07 DIAGNOSIS — F5105 Insomnia due to other mental disorder: Secondary | ICD-10-CM

## 2022-07-07 DIAGNOSIS — R7989 Other specified abnormal findings of blood chemistry: Secondary | ICD-10-CM

## 2022-07-07 DIAGNOSIS — G4733 Obstructive sleep apnea (adult) (pediatric): Secondary | ICD-10-CM

## 2022-07-07 DIAGNOSIS — Z79899 Other long term (current) drug therapy: Secondary | ICD-10-CM

## 2022-07-07 MED ORDER — LISDEXAMFETAMINE DIMESYLATE 70 MG PO CAPS: 70.0000 mg | ORAL_CAPSULE | Freq: Every day | ORAL | 0 refills | Status: DC

## 2022-07-07 MED ORDER — LITHIUM CARBONATE ER 300 MG PO TBCR: 600.0000 mg | EXTENDED_RELEASE_TABLET | Freq: Every day | ORAL | 1 refills | Status: DC

## 2022-07-07 MED ORDER — CARIPRAZINE HCL 1.5 MG PO CAPS: 1.5000 mg | ORAL_CAPSULE | Freq: Every day | ORAL | 5 refills | Status: DC

## 2022-07-07 NOTE — Progress Notes (Signed)
Zachary Dixon [id number] 1966-06-25 56 y.o.   Subjective:   Patient ID:  Zachary Dixon is a 56 y.o. (DOB 07/22/1965) male.  Chief Complaint:  Chief Complaint  Patient presents with   Follow-up    Bipolar I disorder with depression ([place])   Depression   Anxiety   ADHD    Depression        Associated symptoms include no decreased concentration, no fatigue and no suicidal ideas.  Past medical history includes anxiety.   Anxiety Patient reports no confusion, decreased concentration, nausea, nervous/anxious behavior or suicidal ideas.    Medication Refill Pertinent negatives include no fatigue, nausea, sore throat or weakness.   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    at visit October 11, 2018.  Cytomel potentiation was started for depression 25 mcg daily to increase to 50 mcg daily.  In addition there were concerns about flatness and he was wondering if it is related to side effects versus depression.  We reduced the lithium temporarily to see if that was the cause of the flatness. Reductions in lithium and depakote did not help the flatness.  No noticeable effect from the Cytomel except it did help the thyroid panel.  At visit Dec 06, 2018.  [name] was switched to Mydayis to try to improve his function full before waking day. Felt it was less effective than [name].  Didn't feel it kick in and no longer duration at maximum 50 mg so returned to [name]. It's better.   Patient was  seen August 2020.  Because his TSH was suppressed we reduced Cytomel to 37.5 mcg daily.  Because of flatness we reduced the Vraylar to 1.5 mg daily to see if there could be improvement.  Patient seen December 2020.  No med changes were made.  08/28/19 seen with wife, Zachary Dixon Has been under more stress at work for 3 mos.  Not markedly depressed but falls asleep easily in evening and gets tired easily.   Sleeping well with CPAP but may not get to bed early enough.  Falls asleep in chair and then gets to CPAP.  Not enough sleep on nights he has to travel.  Wife concerns.  Doesn't seem happy or interactive.  Used to have good days.  Can be irritable but no anger outbursts.  No mood swings.  No sense of humor which had been there before.  Still anxious and holding it in. We decided to reduce Vraylar to 1.5 mg every other day to see if flatness was improved.  Mood lability was under control.  As of 10/17/19:  Better.  Lighter and more energy.  Wife agrees.  Not depressed lately.  Not even small downturns.  Very even.   Zachary Dixon notices better engagement.  Anxiety still there with work.  Big projects coming to close in next month.  Everyone at work overwhelmed.  Would love to have another job.  She says he's always been stressed by every job. Plans to look elsewhere.   100% use status of CPAP. Compliant with meds.  No med concerns. Plan: Better with Reduced Vraylar re: flatness is better.  1.5 mg every other day. No med changes today  01/09/2020 appointment with the following noted: Consistent with meds.  Pretty good without complaints. Still has prominent gag reflex but has had it his whole life but seems worse than in years past.  Makes it even hard  to wear CPAP.  Not wearing it 8 hours but 5-6 hours now and 2-3 weeks ago wasn't wearing it.  Feels much better after not wearing it. Getting 8 hours sleep per night. Mood, anxiety and focus steady otherwise. No SE with meds. Plan Rec trial with NAC for cognition and mood.  Disc in detail  06/15/2020 appointment with the following noted: Zachary Dixon dx breast CA, stage 1.  No chemo planned.  She's dealing with it pretty well. Work remained stressful.  Lost 3 people but trying to keep perspective with wife's health issues.  Still pursuing another job but little time and energy for that. Mood very good overall.  Primary thing is stress and has to be careful with it,  otherwise anxiety can spend out of control.  Sleeping well. Endoscopy OK in workup with gagging problem.  Gagging interferes with CPAP use.  Can be worse with stress.  Can be difficult to convince doctor's a problem. PCP Dr. Shelia Media is good.  Overreactive gag reflex.  But sometimes can occur out of sleep.  Plan no med changes except again recommend he try NAC for cognitive reasons as noted before.  10/15/2020 appointment with following noted: Zachary Dixon doing well with treatment and finished tx. Still doing therapy.  Some struggle with depression.  Has interests and not as flat as years ago. Really tired and exhausted.  Clearly some related to job and searching. Still deals with gag reflex and sleep mask and sees ENT next week.  Never had problems with claustrophobia but problems with turtle necks, dentist, CPAP now .  Barely wear the CPAP. Doing DBT.  But not following through with all of it. Plan: No med changes Better with Reduced Vraylar re: flatness is better.  1.5 mg every other day.  02/18/2021 appointment with the following noted: Unsure if covid but had sx pneumonia a few mos ago with negative tests. Lately struggling and maybe bc not using CPAP bc mask phobic and still struggles with gagging even with Covid mask.  Has worked on it.  Has tried meditation to help the gagging. Very tired without [name].  Function at home affected by some more stress and conflict.  Chronic work stress may get better with changes and still searching for new job without success.  Spent after the work day.  Not present with family.  Zachary Dixon is upset.  Occ periods of despondency.   Couple weekends just walked off for 4 hours and didn't tell wife what he was doing and scared her.  Over did it. Can still enjoy and has interests. P;an: Better with Reduced Vraylar re: flatness is better.  1.5 mg every other day. No clear indications for med changes  04/29/21 appt noted: Big improvement with CPAP. Energy better. Therapist  helped with meditation at night.  Brain-spotting maybe helping with less gagging.  Most nights sleeping with the mask all night.  Pleased gagging better. Same job with a lot of stress.  Overall doing pretty well. Started men's DBT group at [facility] counseling. Pleased with Vraylar stopped mood swings. No recent mood swings. Except when forgot [name] for 3 days got depressed and couldn't function.. Patient reports stable mood and denies depressed or irritable moods.  Patient denies any recent difficulty with anxiety except work stress.  Patient denies difficulty with sleep initiation or maintenance. Denies appetite disturbance.  Patient reports that energy and motivation have been better with CPAP.  Patient denies any difficulty with concentration.  Patient denies any suicidal ideation.  08/05/21  appt noted: Mental health pretty good.  Still a lot of stress at work.  Trying to look for another job as goal.  Several years ago would have been stress and crushed but managing better. Minor mood swings and not depressed.  Tired and work 60 hours per week. No problems known with meds. Doesn't like CPAP DT gagging which is better and wants [facility]. Zachary Dixon doing well with breast CA. No problems with the meds. Sleep is OK except hard to get enough.  12/08/21 appt noted: Last few weeks a lot of stress and not managing well.  Zachary Dixon hosp with infection.  Lost her job.  $ stress. Usual work stress too. Kind of depression this weekend and not out of control.  Getting better.  Mood changes usually a few hours. CPAP mask broke and delay getting it.  Sleeep a problem for a couple of weeks. Zachary Dixon had Covid a couple of times and been worn down this year.   Son 11 yo and struggling emotionally. Still good interests and enjoyment.  03/10/22 appt noted:  Disc shortage of [name] but on 70 mg daily. Doing well.  With mood. Son off to college and going well.   Major work deadline met in August and less demand so time to  look for new job now.  Was 2 year project. Satisfied with meds. Sleep not too bad. Wants to look into [facility] but is using CPAP. Less anxious. Improving exercise. Plan: Better with Reduced Vraylar re: flatness is better.  1.5 mg every other day. No clear indications for med changes.  No reduction until less stressful job. Continue [name]  70 mg, Depakote ER 1000, Cytomel 37.5, lithium 600, Trazodone  hs PE with Dr. Shelia Media November 2022 for labs. Check lithium level, TSH, D  07/07/22 appt noted: Meds as noted above and compliant. Had PE recently with Dr. Shelia Media. Has been satisfied with [name]. Mild seasonal depression and not too bad.   Still at same job and still looking for antoher job.  It's not been as bad in last 6 mos. Sleep so so and not great.  Saw ENT about Insp[facility] in [place] but awaitng consult in March.  It's a process.  Trying to use CPAP but it's not great.  Acutely aware of when he does not sleep well.  Will be getting antoher sleep study. No major mood swings. No SE concerns.  Is having more nocturia than normal.   Vraylar has done wonders for mood. Still excessive gag reflex interferes with CPAP Zachary Dixon slowly recovering from sepsis and starting work again.  Past psychiatric med medication trials include failure of ECT and TMS,  Latuda 120 mg,  aripiprazole, olanzapine 20 mg, Symbyax Seroquel, Rexulti, Vraylar 3 pramipexole,  lamotrigine, , Depakote 1500 ataxia,  lithium at high blood levels,  Pristiq, sertraline, bupropion 450 mg caused anxiety, Lexapro, Viibryd,  Deplin, Duloxetine, Trintellix, [name] 70, Mydayis Concerta 72 mg, History of some DBT  Review of Systems:  Review of Systems  Constitutional:  Negative for fatigue.  HENT:  Negative for sore throat.        Still gagging  Gastrointestinal:  Negative for nausea.       Gagging comes and goes ongoing  Genitourinary:  Positive for frequency.  Neurological:  Negative for tremors and weakness.   Psychiatric/Behavioral:  Negative for agitation, behavioral problems, confusion, decreased concentration, dysphoric mood, hallucinations, self-injury, sleep disturbance and suicidal ideas. The patient is not nervous/anxious and is not hyperactive.  Medications: I have reviewed the patient's current medications.  Current Outpatient Medications  Medication Sig Dispense Refill   cariprazine (VRAYLAR) 1.5 MG capsule Take 1 capsule (1.5 mg total) by mouth daily. (Patient taking differently: Take 1.5 mg by mouth daily. Every other day) 30 capsule 5   divalproex (DEPAKOTE ER) 500 MG 24 hr tablet Take 2 tablets (1,000 mg total) by mouth at bedtime. 374 tablet 1   Folic Acid (FOLATE PO)      L-methylfolate Calcium 15 MG TABS TAKE 1 TABLET BY MOUTH EVERY DAY 30 tablet 11   liothyronine (CYTOMEL) 25 MCG tablet TAKE 1 & 1/2 TABLETS BY MOUTH EVERY MORNING 135 tablet 0   lithium carbonate (LITHOBID) 300 MG CR tablet Take 2 tablets (600 mg total) by mouth at bedtime. 180 tablet 1   rosuvastatin (CRESTOR) 10 MG tablet Take 10 mg by mouth daily.     Vitamin D, Ergocalciferol, (DRISDOL) 1.25 MG (50000 UNIT) CAPS capsule TAKE 1 CAPSULE (50,000 UNITS TOTAL) BY MOUTH EVERY 7 (SEVEN) DAYS 15 capsule 3   [START ON 08/04/2022] lisdexamfetamine ([name]) 70 MG capsule Take 1 capsule (70 mg total) by mouth daily. 30 capsule 0   [START ON 09/01/2022] lisdexamfetamine ([name]) 70 MG capsule Take 1 capsule (70 mg total) by mouth daily. 30 capsule 0   lisdexamfetamine ([name]) 70 MG capsule Take 1 capsule (70 mg total) by mouth daily. 30 each 0   pantoprazole (PROTONIX) 40 MG tablet Take 40 mg by mouth 2 (two) times daily. (Patient not taking: Reported on 06/13/2022)     traZODone (DESYREL) 50 MG tablet TAKE 1 TO 3 TABLETS BY MOUTH AT BEDTIME (Patient not taking: Reported on 06/13/2022) 270 tablet 0   No current facility-administered medications for this visit.    Medication Side Effects: None  Tremor not gone but  less  Allergies: No Known Allergies  Past Medical History:  Diagnosis Date   ADHD    Allergy    Anxiety    Bipolar 2 disorder ([place])    Depression    Diabetes mellitus without complication (H[facility])    GERD (gastroesophageal reflux disease)    Hyperlipidemia    diet controlled   Sleep apnea    uses CPAP nightly   Thyroid disease     Family History  Problem Relation Age of Onset   Asthma Father    Emphysema Father    Colon cancer Maternal Aunt    Breast cancer Maternal Grandmother    Colon cancer Maternal Grandfather    Heart disease Paternal Grandmother    Heart disease Paternal Grandfather    Rectal cancer Neg Hx    Stomach cancer Neg Hx    Esophageal cancer Neg Hx     Social History   Socioeconomic History   Marital status: Married    Spouse name: Not on file   Number of children: 1   Years of education: Not on file   Highest education level: Not on file  Occupational History   Occupation: marketing  Tobacco Use   Smoking status: Former    Packs/day: 1.00    Years: 7.00    Total pack years: 7.00    Types: Cigarettes    Quit date: 07/11/1992    Years since quitting: 30.0   Smokeless tobacco: Never  Vaping Use   Vaping Use: Never used  Substance and Sexual Activity   Alcohol use: Yes    Alcohol/week: 4.0 standard drinks of alcohol    Types: 4 Standard drinks or  equivalent per week   Drug use: No   Sexual activity: Not Currently  Other Topics Concern   Not on file  Social History Narrative   Not on file   Social Determinants of Health   Financial Resource Strain: Not on file  Food Insecurity: Not on file  Transportation Needs: Not on file  Physical Activity: Not on file  Stress: Not on file  Social Connections: Not on file  Intimate Partner Violence: Not on file    Past Medical History, Surgical history, Social history, and Family history were reviewed and updated as appropriate.   Son Zachary Dixon.  Please see review of systems for further details on  the patient's review from today.   Objective:   Physical Exam:  BP 124/77   Pulse 89   Physical Exam Constitutional:      General: He is not in acute distress.    Appearance: Normal appearance.  Musculoskeletal:        General: No deformity.  Neurological:     Mental Status: He is alert and oriented to person, place, and time.     Cranial Nerves: No dysarthria.     Coordination: Coordination normal.  Psychiatric:        Attention and Perception: Attention and perception normal. He does not perceive auditory or visual hallucinations.        Mood and Affect: Mood is not anxious or depressed. Affect is not labile, blunt, angry or inappropriate.        Speech: Speech normal.        Behavior: Behavior normal. Behavior is cooperative.        Thought Content: Thought content normal. Thought content is not paranoid or delusional. Thought content does not include homicidal or suicidal ideation. Thought content does not include suicidal plan.        Cognition and Memory: Cognition and memory normal.        Judgment: Judgment normal.     Comments: Insight intact      Lab Review:     Component Value Date/Time   NA 141 08/26/2016 1959   K 4.0 08/26/2016 1959   CL 112 (H) 08/26/2016 1959   CO2 24 08/26/2016 1959   GLUCOSE 106 (H) 08/26/2016 1959   BUN 19 08/26/2016 1959   CREATININE 1.06 08/26/2016 1959   CALCIUM 8.8 (L) 08/26/2016 1959   PROT 6.7 08/26/2016 1959   ALBUMIN 3.8 08/26/2016 1959   AST 18 08/26/2016 1959   ALT 21 08/26/2016 1959   ALKPHOS 60 08/26/2016 1959   BILITOT 0.6 08/26/2016 1959   GFRNONAA >60 08/26/2016 1959   GFRAA >60 08/26/2016 1959       Component Value Date/Time   WBC 8.6 08/26/2016 1959   RBC 4.35 08/26/2016 1959   HGB 13.7 08/26/2016 1959   HCT 39.8 08/26/2016 1959   PLT 221 08/26/2016 1959   MCV 91.5 08/26/2016 1959   MCH 31.5 08/26/2016 1959   MCHC 34.4 08/26/2016 1959   RDW 12.6 08/26/2016 1959   LYMPHSABS 2.4 08/26/2016 1959   MONOABS  0.4 08/26/2016 1959   EOSABS 0.2 08/26/2016 1959   BASOSABS 0.0 08/26/2016 1959    Lithium Lvl  Date Value Ref Range Status  04/02/2018 0.7 0.6 - 1.2 mmol/L Final     Lab Results  Component Value Date   VALPROATE 54.6 04/02/2018    TSH at 3   Normal ammonia 47 in 2019.  .res Assessment: Plan:    Bipolar I disorder with depression (  [place]) - Plan: Lithium level, Basic metabolic panel  Generalized anxiety disorder  Insomnia due to mental condition  Attention deficit hyperactivity disorder (ADHD), combined type - Plan: lisdexamfetamine ([name]) 70 MG capsule, lisdexamfetamine ([name]) 70 MG capsule, lisdexamfetamine ([name]) 70 MG capsule  Restless legs syndrome  Obstructive sleep apnea  Low vitamin D level  Lithium use - Plan: Lithium level, Basic metabolic panel   Greater than 50% of face to face time with patient was spent on counseling and coordination of care. 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  His symptoms have been severe and very resistant to any benefit.    He cannot tolerate an increase in the Depakote based on prior ataxic experiences at 1500 mg daily.  Higher doses of lithium have not helped anymore.  He has failed multiple medications and ECT and [place]. HowEver the flatness is much improved with the reduction in in Vraylar to 1.5 mg every other day.  He has had no worsening anxiety, mood swings nor depression.  Gagging appears phobic and  not med related.  Can occur before or after asleep. Med changes have not helped. Can experimenet with trazodone to see if it will help better with different doses.  He was not having extreme cycling like he did in the past.  We previously had reduced lithium to 600 mg daily and reduce Depakote to 500 mg daily so those were not changed. Overall his mood is worse a bit and may be due to reduced use of CPAP bc  gagging.  He has been less and less compliant with his CPAP lately due to gagging problem to be evaluatied by ENT in Jan.  More tired.  No indication that psych meds causing tiredness. Disc Inspire and he's checking into it. Marland Kitchen RLS managed.  Pramipexole prn for RLS this is managed at this time.  Continue vitamin D check level later.  Continue B12. Extensive discussions about possible supplements that could be helpful.  He will continue the following. B-Complex 150 Still rec N-Acetylcysteine at 1000-1200 mg daily to help with mild cognitive problems and depression.  It can be combined with a B-complex vitamin as the B-12 and folate have been shown to sometimes enhance the effect. L-methylfolate 15 mg daily for depression.  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  Discussed his polypharmacy which is not ideal but has been necessary to control the mood cycling and now his depression has improved.  Plus he also has ADD that has to be managed.  He is satisfied with his ADD management with [name].  Better with Reduced Vraylar re: flatness is better.  1.5 mg every other day. No clear indications for med changes.  No reduction until less stressful job. Continue [name]  70 mg, Depakote ER 1000, Cytomel 37.5, lithium 600, Trazodone  hs Can try lithium in am to see if nocturia better  PE with Dr. Shelia Media November 2022 for labs. Check lithium level, TSH, D emphasized.  Still don't have any labs for 2023  FU 6 months.    Lynder Parents, MD, DFAPA   Please see After Visit Summary for patient specific instructions.  Future Appointments  Date Time Provider [place]  07/13/2022  9:00 AM Armbruster, Carlota Raspberry, MD [facility]     Orders Placed This Encounter  Procedures   Lithium level   Basic metabolic panel       -------------------------------

## 2022-07-08 DIAGNOSIS — F319 Bipolar disorder, unspecified: Secondary | ICD-10-CM | POA: Diagnosis not present

## 2022-07-08 DIAGNOSIS — Z79899 Other long term (current) drug therapy: Secondary | ICD-10-CM | POA: Diagnosis not present

## 2022-07-09 LAB — BASIC METABOLIC PANEL
BUN: 21 mg/dL (ref 7–25)
CO2: 26 mmol/L (ref 20–32)
Calcium: 9.3 mg/dL (ref 8.6–10.3)
Chloride: 107 mmol/L (ref 98–110)
Creat: 1.15 mg/dL (ref 0.70–1.30)
Glucose, Bld: 106 mg/dL — ABNORMAL HIGH (ref 65–99)
Potassium: 4.3 mmol/L (ref 3.5–5.3)
Sodium: 141 mmol/L (ref 135–146)

## 2022-07-09 LAB — LITHIUM LEVEL: Lithium Lvl: <0.3 mmol/L — ABNORMAL LOW (ref 0.6–1.2)

## 2022-07-12 ENCOUNTER — Encounter: Payer: Self-pay | Admitting: Certified Registered Nurse Anesthetist

## 2022-07-13 ENCOUNTER — Ambulatory Visit (AMBULATORY_SURGERY_CENTER): Payer: BC Managed Care – PPO | Admitting: Gastroenterology

## 2022-07-13 ENCOUNTER — Encounter: Payer: Self-pay | Admitting: Gastroenterology

## 2022-07-13 VITALS — BP 131/83 | HR 73 | Temp 95.5°F | Resp 22 | Ht 73.0 in | Wt 226.0 lb

## 2022-07-13 DIAGNOSIS — Z86010 Personal history of colonic polyps: Secondary | ICD-10-CM | POA: Diagnosis not present

## 2022-07-13 DIAGNOSIS — Z09 Encounter for follow-up examination after completed treatment for conditions other than malignant neoplasm: Secondary | ICD-10-CM

## 2022-07-13 DIAGNOSIS — Z1211 Encounter for screening for malignant neoplasm of colon: Secondary | ICD-10-CM | POA: Diagnosis not present

## 2022-07-13 DIAGNOSIS — D123 Benign neoplasm of transverse colon: Secondary | ICD-10-CM | POA: Diagnosis not present

## 2022-07-13 MED ORDER — SODIUM CHLORIDE 0.9 % IV SOLN: 500.0000 mL | INTRAVENOUS | Status: DC

## 2022-07-13 NOTE — Progress Notes (Signed)
Called to room to assist during endoscopic procedure.  Patient ID and intended procedure confirmed with present staff. Received instructions for my participation in the procedure from the performing physician.  

## 2022-07-13 NOTE — Op Note (Signed)
[place] Patient Name: Zachary Dixon Procedure Date: 07/13/2022 9:13 AM MRN: [id number] Endoscopist: Remo Lipps P. Havery [name] , MD, [id number] Age: 57 Referring MD:  Date of Birth: [birth year] Gender: Male Account #: [id number] Procedure:                Colonoscopy Indications:              High risk colon cancer surveillance: Personal                            history of colonic polyps - 6 polyps removed 03/2019 Medicines:                Monitored Anesthesia Care Procedure:                Pre-Anesthesia Assessment:                           - Prior to the procedure, a History and Physical                            was performed, and patient medications and                            allergies were reviewed. The patient's tolerance of                            previous anesthesia was also reviewed. The risks                            and benefits of the procedure and the sedation                            options and risks were discussed with the patient.                            All questions were answered, and informed consent                            was obtained. Prior Anticoagulants: The patient has                            taken no anticoagulant or antiplatelet agents. ASA                            Grade Assessment: II - A patient with mild systemic                            disease. After reviewing the risks and benefits,                            the patient was deemed in satisfactory condition to                            undergo the procedure.  After obtaining informed consent, the colonoscope                            was passed under direct vision. Throughout the                            procedure, the patient's blood pressure, pulse, and                            oxygen saturations were monitored continuously. The                            Olympus CF-HQ190L (Serial# [id number]) Colonoscope was                             introduced through the anus and advanced to the the                            cecum, identified by appendiceal orifice and                            ileocecal valve. The colonoscopy was performed                            without difficulty. The patient tolerated the                            procedure well. The quality of the bowel                            preparation was good. The ileocecal valve,                            appendiceal orifice, and rectum were photographed. Scope In: 9:18:35 AM Scope Out: 9:39:57 AM Scope Withdrawal Time: 0 hours 17 minutes 7 seconds  Total Procedure Duration: 0 hours 21 minutes 22 seconds  Findings:                 The perianal and digital rectal examinations were                            normal.                           A 3 mm polyp was found in the transverse colon. The                            polyp was sessile. The polyp was removed with a                            cold snare. Resection and retrieval were complete.                           Internal hemorrhoids were found during  retroflexion. The hemorrhoids were small.                           The exam was otherwise without abnormality. Complications:            No immediate complications. Estimated blood loss:                            Minimal. Estimated Blood Loss:     Estimated blood loss was minimal. Impression:               - One 3 mm polyp in the transverse colon, removed                            with a cold snare. Resected and retrieved.                           - Internal hemorrhoids.                           - The examination was otherwise normal. Recommendation:           - Patient has a contact number available for                            emergencies. The signs and symptoms of potential                            delayed complications were discussed with the                            patient. Return to normal activities tomorrow.                             Written discharge instructions were provided to the                            patient.                           - Resume previous diet.                           - Continue present medications.                           - Await pathology results. Anticipate repeat                            colonoscopy in 5 years given polyp burden on the                            last exam Remo Lipps P. [name], MD 07/13/2022 9:43:26 AM This report has been signed electronically.

## 2022-07-13 NOTE — Patient Instructions (Addendum)
  Patient has a contact number available for emergencies. The signs and symptoms of potential delayed complications were discussed with the patient. Return to normal activities tomorrow. Written discharge instructions were provided to the patient. - Resume previous diet. - Continue present medications. - Await pathology results. Anticipate repeat colonoscopy in 5 years given polyp burden on the last exam.  YOU HAD AN ENDOSCOPIC PROCEDURE TODAY AT [place]:   Refer to the procedure report that was given to you for any specific questions about what was found during the examination.  If the procedure report does not answer your questions, please call your gastroenterologist to clarify.  If you requested that your care partner not be given the details of your procedure findings, then the procedure report has been included in a sealed envelope for you to review at your convenience later.  YOU SHOULD EXPECT: Some feelings of bloating in the abdomen. Passage of more gas than usual.  Walking can help get rid of the air that was put into your GI tract during the procedure and reduce the bloating. If you had a lower endoscopy (such as a colonoscopy or flexible sigmoidoscopy) you may notice spotting of blood in your stool or on the toilet paper. If you underwent a bowel prep for your procedure, you may not have a normal bowel movement for a few days.  Please Note:  You might notice some irritation and congestion in your nose or some drainage.  This is from the oxygen used during your procedure.  There is no need for concern and it should clear up in a day or so.  SYMPTOMS TO REPORT IMMEDIATELY:  Following lower endoscopy (colonoscopy or flexible sigmoidoscopy):  Excessive amounts of blood in the stool  Significant tenderness or worsening of abdominal pains  Swelling of the abdomen that is new, acute  Fever of 100F or higher   For urgent or emergent issues, a gastroenterologist can be  reached at any hour by calling [phone]. Do not use MyChart messaging for urgent concerns.    DIET:  We do recommend a small meal at first, but then you may proceed to your regular diet.  Drink plenty of fluids but you should avoid alcoholic beverages for 24 hours.  ACTIVITY:  You should plan to take it easy for the rest of today and you should NOT DRIVE or use heavy machinery until tomorrow (because of the sedation medicines used during the test).    FOLLOW UP: Our staff will call the number listed on your records the next business day following your procedure.  We will call around 7:15- 8:00 am to check on you and address any questions or concerns that you may have regarding the information given to you following your procedure. If we do not reach you, we will leave a message.     If any biopsies were taken you will be contacted by phone or by letter within the next 1-3 weeks.  Please call us at [phone] if you have not heard about the biopsies in 3 weeks.    SIGNATURES/CONFIDENTIALITY: You and/or your care partner have signed paperwork which will be entered into your electronic medical record.  These signatures attest to the fact that that the information above on your After Visit Summary has been reviewed and is understood.  Full responsibility of the confidentiality of this discharge information lies with you and/or your care-partner.

## 2022-07-13 NOTE — Progress Notes (Signed)
Report given to PACU, vss 

## 2022-07-13 NOTE — Progress Notes (Signed)
[facility] History and Physical   Primary Care Physician:  Deland Pretty, MD   Reason for Procedure:   History of colon polyps  Plan:    colonoscopy     HPI: Zachary Dixon is a 57 y.o. male  here for colonoscopy surveillance - 6 polyps removed 03/2019. Patient denies any bowel symptoms at this time. No family history of colon cancer known. Otherwise feels well without any cardiopulmonary symptoms.   I have discussed risks / benefits of anesthesia and endoscopic procedure with [name] and they wish to proceed with the exams as outlined today.    Past Medical History:  Diagnosis Date   ADHD    Allergy    Anxiety    Bipolar 2 disorder ([place])    Depression    Diabetes mellitus without complication (HCC)    GERD (gastroesophageal reflux disease)    Hyperlipidemia    diet controlled   Sleep apnea    uses CPAP nightly   Thyroid disease     Past Surgical History:  Procedure Laterality Date   COLONOSCOPY  2019   ELECTROCONVULSIVE THERAPY  2018   at [facility]      Prior to Admission medications   Medication Sig Start Date End Date Taking? Authorizing Provider  cariprazine (VRAYLAR) 1.5 MG capsule Take 1 capsule (1.5 mg total) by mouth daily. 07/07/22  Yes Cottle, Billey Co., MD  cyanocobalamin (VITAMIN B12) 1000 MCG tablet Take by mouth.   Yes [provider]  divalproex (DEPAKOTE ER) 500 MG 24 hr tablet Take 2 tablets (1,000 mg total) by mouth at bedtime. 04/29/21  Yes Cottle, Billey Co., MD  liothyronine (CYTOMEL) 25 MCG tablet TAKE 1 & 1/2 TABLETS BY MOUTH EVERY MORNING 06/01/22  Yes Cottle, Billey Co., MD  lisdexamfetamine ([name]) 70 MG capsule Take 1 capsule (70 mg total) by mouth daily. 08/04/22  Yes Cottle, Billey Co., MD  lisdexamfetamine ([name]) 70 MG capsule Take 1 capsule (70 mg total) by mouth daily. 09/01/22  Yes Cottle, Billey Co., MD  lisdexamfetamine ([name]) 70 MG capsule Take 1 capsule (70  mg total) by mouth daily. 07/07/22  Yes Cottle, Billey Co., MD  lithium carbonate (LITHOBID) 300 MG ER tablet Take 2 tablets (600 mg total) by mouth at bedtime. 07/07/22  Yes Cottle, Billey Co., MD  Omega-3 1000 MG CAPS Take by mouth.   Yes [provider]  rosuvastatin (CRESTOR) 10 MG tablet Take 10 mg by mouth daily. 06/06/22  Yes [provider]  Vitamin D, Ergocalciferol, (DRISDOL) 1.25 MG (50000 UNIT) CAPS capsule TAKE 1 CAPSULE (50,000 UNITS TOTAL) BY MOUTH EVERY 7 (SEVEN) DAYS 05/31/22  Yes Cottle, Billey Co., MD  Folic Acid (FOLATE PO)     [provider]  L-methylfolate Calcium 15 MG TABS TAKE 1 TABLET BY MOUTH EVERY DAY Patient not taking: Reported on 07/13/2022 10/25/21   [name], MD    Current Outpatient Medications  Medication Sig Dispense Refill   cariprazine (VRAYLAR) 1.5 MG capsule Take 1 capsule (1.5 mg total) by mouth daily. 30 capsule 5   cyanocobalamin (VITAMIN B12) 1000 MCG tablet Take by mouth.     divalproex (DEPAKOTE ER) 500 MG 24 hr tablet Take 2 tablets (1,000 mg total) by mouth at bedtime. 180 tablet 1   liothyronine (CYTOMEL) 25 MCG tablet TAKE 1 & 1/2 TABLETS BY MOUTH EVERY MORNING 135 tablet 0   [START ON  08/04/2022] lisdexamfetamine ([name]) 70 MG capsule Take 1 capsule (70 mg total) by mouth daily. 30 capsule 0   [START ON 09/01/2022] lisdexamfetamine ([name]) 70 MG capsule Take 1 capsule (70 mg total) by mouth daily. 30 capsule 0   lisdexamfetamine ([name]) 70 MG capsule Take 1 capsule (70 mg total) by mouth daily. 30 each 0   lithium carbonate (LITHOBID) 300 MG ER tablet Take 2 tablets (600 mg total) by mouth at bedtime. 180 tablet 1   Omega-3 1000 MG CAPS Take by mouth.     rosuvastatin (CRESTOR) 10 MG tablet Take 10 mg by mouth daily.     Vitamin D, Ergocalciferol, (DRISDOL) 1.25 MG (50000 UNIT) CAPS capsule TAKE 1 CAPSULE (50,000 UNITS TOTAL) BY MOUTH EVERY 7 (SEVEN) DAYS 15 capsule 3   Folic Acid (FOLATE PO)   (Patient not taking: Reported on 07/13/2022)     L-methylfolate Calcium 15 MG TABS TAKE 1 TABLET BY MOUTH EVERY DAY (Patient not taking: Reported on 07/13/2022) 30 tablet 11   Current Facility-Administered Medications  Medication Dose Route Frequency Provider Last Rate Last Admin   0.9 %  sodium chloride infusion  500 mL Intravenous Continuous Terrick Allred, Carlota Raspberry, MD        Allergies as of 07/13/2022   (No Known Allergies)    Family History  Problem Relation Age of Onset   Asthma Father    Emphysema Father    Colon cancer Maternal Aunt    Breast cancer Maternal Grandmother    Colon cancer Maternal Grandfather    Heart disease Paternal Grandmother    Heart disease Paternal Grandfather    Rectal cancer Neg Hx    Stomach cancer Neg Hx    Esophageal cancer Neg Hx     Social History   Socioeconomic History   Marital status: Married    Spouse name: Not on file   Number of children: 1   Years of education: Not on file   Highest education level: Not on file  Occupational History   Occupation: marketing  Tobacco Use   Smoking status: Former    Packs/day: 1.00    Years: 7.00    Total pack years: 7.00    Types: Cigarettes    Quit date: 07/11/1992    Years since quitting: 30.0   Smokeless tobacco: Never  Vaping Use   Vaping Use: Never used  Substance and Sexual Activity   Alcohol use: Yes    Alcohol/week: 4.0 standard drinks of alcohol    Types: 4 Standard drinks or equivalent per week   Drug use: No   Sexual activity: Not Currently  Other Topics Concern   Not on file  Social History Narrative   Not on file   Social Determinants of Health   Financial Resource Strain: Not on file  Food Insecurity: Not on file  Transportation Needs: Not on file  Physical Activity: Not on file  Stress: Not on file  Social Connections: Not on file  Intimate Partner Violence: Not on file    Review of Systems: All other review of systems negative except as mentioned in the  HPI.  Physical Exam: Vital signs BP 115/67   Pulse 83   Temp (!) 95.5 F (35.3 C)   Ht '6\' 1"'$  (1.854 m)   Wt 226 lb (102.5 kg)   SpO2 95%   BMI 29.82 kg/m   General:   Alert,  Well-developed, pleasant and cooperative in NAD Lungs:  Clear throughout to auscultation.   Heart:  Regular  rate and rhythm Abdomen:  Soft, nontender and nondistended.   Neuro/Psych:  Alert and cooperative. Normal mood and affect. A and O x 3  Jolly Mango, MD [facility]

## 2022-07-14 ENCOUNTER — Telehealth: Payer: Self-pay | Admitting: *Deleted

## 2022-07-14 DIAGNOSIS — F341 Dysthymic disorder: Secondary | ICD-10-CM | POA: Diagnosis not present

## 2022-07-14 DIAGNOSIS — F900 Attention-deficit hyperactivity disorder, predominantly inattentive type: Secondary | ICD-10-CM | POA: Diagnosis not present

## 2022-07-14 NOTE — Telephone Encounter (Signed)
Post procedure follow up call placed, no answer and left VM.  

## 2022-07-15 DIAGNOSIS — G4733 Obstructive sleep apnea (adult) (pediatric): Secondary | ICD-10-CM | POA: Diagnosis not present

## 2022-07-21 DIAGNOSIS — F341 Dysthymic disorder: Secondary | ICD-10-CM | POA: Diagnosis not present

## 2022-07-21 DIAGNOSIS — F900 Attention-deficit hyperactivity disorder, predominantly inattentive type: Secondary | ICD-10-CM | POA: Diagnosis not present

## 2022-07-28 DIAGNOSIS — F341 Dysthymic disorder: Secondary | ICD-10-CM | POA: Diagnosis not present

## 2022-07-28 DIAGNOSIS — F900 Attention-deficit hyperactivity disorder, predominantly inattentive type: Secondary | ICD-10-CM | POA: Diagnosis not present

## 2022-08-01 ENCOUNTER — Telehealth: Payer: Self-pay | Admitting: Psychiatry

## 2022-08-01 ENCOUNTER — Other Ambulatory Visit: Payer: Self-pay | Admitting: Psychiatry

## 2022-08-01 DIAGNOSIS — F902 Attention-deficit hyperactivity disorder, combined type: Secondary | ICD-10-CM

## 2022-08-01 MED ORDER — LISDEXAMFETAMINE DIMESYLATE 70 MG PO CAPS: 70.0000 mg | ORAL_CAPSULE | Freq: Every day | ORAL | 0 refills | Status: DC

## 2022-08-01 NOTE — Telephone Encounter (Signed)
completed

## 2022-08-01 NOTE — Telephone Encounter (Signed)
Patient called back regarding previous message. States that he has been out since yesterday and he missed work today and isn't able to function. Please call when done [phone]

## 2022-08-01 NOTE — Telephone Encounter (Signed)
Arjun would like to get his [name] '70mg'$  RX printed so he can take to a pharmacy that has it in stock. Call when printed . He is out and would like to get it asap.

## 2022-08-01 NOTE — Telephone Encounter (Signed)
Please review

## 2022-08-04 DIAGNOSIS — F900 Attention-deficit hyperactivity disorder, predominantly inattentive type: Secondary | ICD-10-CM | POA: Diagnosis not present

## 2022-08-04 DIAGNOSIS — F341 Dysthymic disorder: Secondary | ICD-10-CM | POA: Diagnosis not present

## 2022-08-11 ENCOUNTER — Telehealth: Payer: Self-pay

## 2022-08-11 NOTE — Telephone Encounter (Signed)
Prior Authorization [name] 70 mg #30 [facility]  Drug is covered by current benefit plan. No further PA activity needed.

## 2022-08-25 DIAGNOSIS — F341 Dysthymic disorder: Secondary | ICD-10-CM | POA: Diagnosis not present

## 2022-08-25 DIAGNOSIS — F900 Attention-deficit hyperactivity disorder, predominantly inattentive type: Secondary | ICD-10-CM | POA: Diagnosis not present

## 2022-08-26 DIAGNOSIS — Z23 Encounter for immunization: Secondary | ICD-10-CM | POA: Diagnosis not present

## 2022-08-29 ENCOUNTER — Other Ambulatory Visit: Payer: Self-pay | Admitting: Psychiatry

## 2022-08-29 ENCOUNTER — Telehealth: Payer: Self-pay | Admitting: Psychiatry

## 2022-08-29 ENCOUNTER — Other Ambulatory Visit: Payer: Self-pay

## 2022-08-29 DIAGNOSIS — F902 Attention-deficit hyperactivity disorder, combined type: Secondary | ICD-10-CM

## 2022-08-29 MED ORDER — LISDEXAMFETAMINE DIMESYLATE 70 MG PO CAPS: 70.0000 mg | ORAL_CAPSULE | Freq: Every day | ORAL | 0 refills | Status: DC

## 2022-08-29 NOTE — Telephone Encounter (Signed)
Pt called [facility] has [name] BRAND 70 mg. Send there. Apt 01/05/23

## 2022-08-29 NOTE — Telephone Encounter (Signed)
Pended.

## 2022-08-31 ENCOUNTER — Other Ambulatory Visit: Payer: Self-pay

## 2022-08-31 DIAGNOSIS — F902 Attention-deficit hyperactivity disorder, combined type: Secondary | ICD-10-CM

## 2022-08-31 MED ORDER — VYVANSE 70 MG PO CAPS: 70.0000 mg | ORAL_CAPSULE | Freq: Every day | ORAL | 0 refills | Status: DC

## 2022-08-31 NOTE — Telephone Encounter (Signed)
Rx paper

## 2022-08-31 NOTE — Telephone Encounter (Signed)
Pt called at 9:26a for refill of [name] to be sent to [facility]. He is also asking if Dr Clovis Pu would write him paper scripts for the March and April scripts so he can find them on his own.  His wife says that Janett Billow does that for her with Adderall and it works out better than her having to keep calling around to find it and then call us.  Next appt 6/27

## 2022-09-01 ENCOUNTER — Other Ambulatory Visit: Payer: Self-pay

## 2022-09-01 ENCOUNTER — Other Ambulatory Visit: Payer: Self-pay | Admitting: Psychiatry

## 2022-09-01 ENCOUNTER — Telehealth: Payer: Self-pay | Admitting: Psychiatry

## 2022-09-01 DIAGNOSIS — F341 Dysthymic disorder: Secondary | ICD-10-CM | POA: Diagnosis not present

## 2022-09-01 DIAGNOSIS — F902 Attention-deficit hyperactivity disorder, combined type: Secondary | ICD-10-CM

## 2022-09-01 DIAGNOSIS — F900 Attention-deficit hyperactivity disorder, predominantly inattentive type: Secondary | ICD-10-CM | POA: Diagnosis not present

## 2022-09-01 MED ORDER — LISDEXAMFETAMINE DIMESYLATE 70 MG PO CAPS
70.0000 mg | ORAL_CAPSULE | Freq: Every day | ORAL | 0 refills | Status: DC
  Filled 2022-09-15 (×2): qty 30, 30d supply, fill #0

## 2022-09-01 MED ORDER — LISDEXAMFETAMINE DIMESYLATE 70 MG PO CAPS: 70.0000 mg | ORAL_CAPSULE | Freq: Every day | ORAL | 0 refills | Status: DC

## 2022-09-01 NOTE — Telephone Encounter (Signed)
Nicole Kindred called at 10:08.  He said to Cancel the prescription for [name] at [facility] because they don't work with his insurance and only have Brand so the cost is over $400.  He also said the [facility]. He called 7 pharmacies yesterday so will have to keep trying to find one that has it.  He is requesting the he get 3 written prescriptions so he can have them when he finds a pharmacy.  He is also willing to take the generic if he finds that versus the Brand.  Can the prescriptions be written in a manner that he can get either?  Call him when the prescriptions are ready and he will pick them up.

## 2022-09-01 NOTE — Telephone Encounter (Signed)
Sent 3 Rx to print. Canceled all RF at [facility].

## 2022-09-02 ENCOUNTER — Other Ambulatory Visit: Payer: Self-pay | Admitting: Psychiatry

## 2022-09-02 ENCOUNTER — Other Ambulatory Visit (HOSPITAL_BASED_OUTPATIENT_CLINIC_OR_DEPARTMENT_OTHER): Payer: Self-pay

## 2022-09-02 DIAGNOSIS — F319 Bipolar disorder, unspecified: Secondary | ICD-10-CM

## 2022-09-02 MED ORDER — LISDEXAMFETAMINE DIMESYLATE 70 MG PO CAPS
70.0000 mg | ORAL_CAPSULE | Freq: Every day | ORAL | 0 refills | Status: DC
  Filled 2022-09-02: qty 30, 30d supply, fill #0

## 2022-09-02 NOTE — Telephone Encounter (Signed)
I signed paper RX for the Vyavanse so he could go wherever he wanted.  They should be upfront waiting for him to pick up.  We sent another RX also.  Has he been able to get it?  Try [facility].   There are no dangerous withdrawal sx, but I'm concerned he will be more immediately fatigued and unfocused.  I'm also concerned he may get more depressed.  If he can't get it by the end of the weeken and we can send in some version of Adderall that will be better than nothing.

## 2022-09-02 NOTE — Telephone Encounter (Signed)
Joseangel called again asking if he will have any major side effects from not taking the [name] for the time he has been off of it. His phone number is [phone].

## 2022-09-05 MED ORDER — LISDEXAMFETAMINE DIMESYLATE 70 MG PO CAPS: 70.0000 mg | ORAL_CAPSULE | Freq: Every day | ORAL | 0 refills | Status: DC

## 2022-09-05 NOTE — Telephone Encounter (Signed)
LVM to RC 

## 2022-09-06 ENCOUNTER — Telehealth: Payer: Self-pay | Admitting: Psychiatry

## 2022-09-06 NOTE — Telephone Encounter (Signed)
Checked PMP database and it does not show a RF for [name] so far. Left VM for patient yesterday, left another today to discuss options.

## 2022-09-06 NOTE — Telephone Encounter (Signed)
Zachary Dixon called back, Still can't locate [name]. One pharmacist told him there is a chewable type of [name] in 50 or 60 mg? Can he try this?

## 2022-09-07 ENCOUNTER — Other Ambulatory Visit: Payer: Self-pay | Admitting: Psychiatry

## 2022-09-07 DIAGNOSIS — G4733 Obstructive sleep apnea (adult) (pediatric): Secondary | ICD-10-CM | POA: Diagnosis not present

## 2022-09-07 MED ORDER — DYANAVEL XR 20 MG PO CHER: 1.0000 | CHEWABLE_EXTENDED_RELEASE_TABLET | Freq: Every morning | ORAL | 0 refills | Status: DC

## 2022-09-07 NOTE — Telephone Encounter (Signed)
Tried calling patient and got his wife. She said he still had not been able to find the generic [name] and could not afford brand. I told her that we could send in Rx for Adderall and she said that sounded good to her.  Wife asks for a printed Rx for Adderall. Patient is difficult to get in touch with, but wife said he was working from home tomorrow and could pick up script.

## 2022-09-07 NOTE — Telephone Encounter (Signed)
Has he tried [facility]? The closest option to [name] is Dyanavel.  I have been told it is 100% available if sent to mail order pharmacy [facility] and can get it for $25.  I sent it to the [facility] and they will call or text him within a few hours.  He needs to respond to them.  The local pharmacy won't have it but might be able to get it in the future if desired.

## 2022-09-08 DIAGNOSIS — F341 Dysthymic disorder: Secondary | ICD-10-CM | POA: Diagnosis not present

## 2022-09-08 DIAGNOSIS — F900 Attention-deficit hyperactivity disorder, predominantly inattentive type: Secondary | ICD-10-CM | POA: Diagnosis not present

## 2022-09-08 NOTE — Telephone Encounter (Signed)
Wife notified.

## 2022-09-12 DIAGNOSIS — G4733 Obstructive sleep apnea (adult) (pediatric): Secondary | ICD-10-CM | POA: Diagnosis not present

## 2022-09-15 ENCOUNTER — Other Ambulatory Visit: Payer: Self-pay

## 2022-09-15 ENCOUNTER — Other Ambulatory Visit (HOSPITAL_BASED_OUTPATIENT_CLINIC_OR_DEPARTMENT_OTHER): Payer: Self-pay

## 2022-09-15 ENCOUNTER — Telehealth: Payer: Self-pay | Admitting: Psychiatry

## 2022-09-15 ENCOUNTER — Other Ambulatory Visit: Payer: Self-pay | Admitting: Psychiatry

## 2022-09-15 DIAGNOSIS — F900 Attention-deficit hyperactivity disorder, predominantly inattentive type: Secondary | ICD-10-CM | POA: Diagnosis not present

## 2022-09-15 DIAGNOSIS — F902 Attention-deficit hyperactivity disorder, combined type: Secondary | ICD-10-CM

## 2022-09-15 DIAGNOSIS — F341 Dysthymic disorder: Secondary | ICD-10-CM | POA: Diagnosis not present

## 2022-09-15 MED ORDER — VYVANSE 70 MG PO CAPS: 70.0000 mg | ORAL_CAPSULE | Freq: Every day | ORAL | 0 refills | Status: DC

## 2022-09-15 NOTE — Telephone Encounter (Signed)
Prior Authorization submitted for Dyanavel XR 20 mg to [facility], pending response from [facility].

## 2022-09-15 NOTE — Telephone Encounter (Signed)
Beverlee Nims would like to discuss mutual patient with [name].  We do have a consent to speak. Pleas call Beverlee Nims at [phone]

## 2022-09-15 NOTE — Telephone Encounter (Signed)
Please see message. I called the number, they identified themselves as [facility] and wanted to speak with you.

## 2022-09-16 ENCOUNTER — Other Ambulatory Visit: Payer: Self-pay | Admitting: Psychiatry

## 2022-09-16 MED ORDER — AMPHETAMINE-DEXTROAMPHET ER 30 MG PO CP24: 30.0000 mg | ORAL_CAPSULE | Freq: Every day | ORAL | 0 refills | Status: DC

## 2022-09-16 NOTE — Telephone Encounter (Signed)
He has to have tried generic adderall XR as well

## 2022-09-16 NOTE — Telephone Encounter (Signed)
Received a DENIAL from [facility] for Dyanavel XR 20 mg  Pt has to have tried and failed 2 (BOTH) formulary alternatives 1. Adderall XR generics and generic [name] capsules or chewables.    [name] I did send your last office note, if there is anything I can update I can resend with information.

## 2022-09-16 NOTE — Telephone Encounter (Signed)
He's been on brand and Generic [name], Mydayis and generic Concerta

## 2022-09-16 NOTE — Telephone Encounter (Signed)
Please let him know that I sent in the Adderall XR which should be available right away.  I sent this in slowly to fulfill the insurance requirements for him trying other generics before they will approve the Dyanavel at a cheaper price.  He needs to pick up the prescription.  He can take it if he wants if he does not have any other stimulant available but he will find that it does not last as long as [name] so it is not a good long-term option.

## 2022-09-19 ENCOUNTER — Other Ambulatory Visit: Payer: Self-pay | Admitting: Psychiatry

## 2022-09-19 NOTE — Telephone Encounter (Signed)
Patient was able to get a fill for Adderall on 3/8.

## 2022-09-22 DIAGNOSIS — F341 Dysthymic disorder: Secondary | ICD-10-CM | POA: Diagnosis not present

## 2022-09-22 DIAGNOSIS — F900 Attention-deficit hyperactivity disorder, predominantly inattentive type: Secondary | ICD-10-CM | POA: Diagnosis not present

## 2022-09-28 ENCOUNTER — Encounter (HOSPITAL_BASED_OUTPATIENT_CLINIC_OR_DEPARTMENT_OTHER): Payer: Self-pay

## 2022-09-28 ENCOUNTER — Other Ambulatory Visit (HOSPITAL_BASED_OUTPATIENT_CLINIC_OR_DEPARTMENT_OTHER): Payer: Self-pay

## 2022-09-29 DIAGNOSIS — F341 Dysthymic disorder: Secondary | ICD-10-CM | POA: Diagnosis not present

## 2022-09-29 DIAGNOSIS — F900 Attention-deficit hyperactivity disorder, predominantly inattentive type: Secondary | ICD-10-CM | POA: Diagnosis not present

## 2022-10-03 ENCOUNTER — Other Ambulatory Visit (HOSPITAL_BASED_OUTPATIENT_CLINIC_OR_DEPARTMENT_OTHER): Payer: Self-pay

## 2022-10-06 DIAGNOSIS — F341 Dysthymic disorder: Secondary | ICD-10-CM | POA: Diagnosis not present

## 2022-10-06 DIAGNOSIS — F900 Attention-deficit hyperactivity disorder, predominantly inattentive type: Secondary | ICD-10-CM | POA: Diagnosis not present

## 2022-10-10 ENCOUNTER — Telehealth: Payer: Self-pay | Admitting: Psychiatry

## 2022-10-10 ENCOUNTER — Other Ambulatory Visit: Payer: Self-pay

## 2022-10-10 DIAGNOSIS — I700 Atherosclerosis of aorta: Secondary | ICD-10-CM

## 2022-10-10 NOTE — Telephone Encounter (Signed)
09/19/2022 09/07/2022 1  Dyanavel Xr 20 Mg Tablet 30.00 30 [place] [id number] Per ([id number]) 0/0  Other [place] 09/16/2022 09/16/2022 1  Dextroamp-Amphet Er 30 Mg Cap 30.00 30 Ca Cot [id number] Nor ([id number])

## 2022-10-10 NOTE — Telephone Encounter (Signed)
LVM to RC 

## 2022-10-10 NOTE — Telephone Encounter (Signed)
Next visit is 01/05/23. Nicole Kindred took the [facility] for one day and he has reactions such as dizziness and queasiness. He stopped taking it. States Adderall is working fine. Requesting refill on    [id number] [place], [place] [address]   Phone: [phone]  Fax: [phone]

## 2022-10-13 DIAGNOSIS — F341 Dysthymic disorder: Secondary | ICD-10-CM | POA: Diagnosis not present

## 2022-10-13 DIAGNOSIS — F900 Attention-deficit hyperactivity disorder, predominantly inattentive type: Secondary | ICD-10-CM | POA: Diagnosis not present

## 2022-10-14 ENCOUNTER — Other Ambulatory Visit: Payer: Self-pay

## 2022-10-14 MED ORDER — AMPHETAMINE-DEXTROAMPHET ER 30 MG PO CP24: 30.0000 mg | ORAL_CAPSULE | Freq: Every day | ORAL | 0 refills | Status: DC

## 2022-10-14 NOTE — Telephone Encounter (Signed)
Pended.

## 2022-10-26 ENCOUNTER — Telehealth: Payer: Self-pay | Admitting: Psychiatry

## 2022-10-26 DIAGNOSIS — R635 Abnormal weight gain: Secondary | ICD-10-CM | POA: Diagnosis not present

## 2022-10-26 DIAGNOSIS — R351 Nocturia: Secondary | ICD-10-CM | POA: Diagnosis not present

## 2022-10-26 DIAGNOSIS — R350 Frequency of micturition: Secondary | ICD-10-CM | POA: Diagnosis not present

## 2022-10-26 NOTE — Telephone Encounter (Signed)
Next visit  is 01/05/23. Requesting refill on Vraylar 1.5 mg called to :  [id number] [place], [place] - [address] AT [facility] [address] [facility] [address] [facility] [address]   Phone: [phone]  Fax: [phone]

## 2022-10-26 NOTE — Telephone Encounter (Signed)
Patient has RF available. LVM to RC.

## 2022-10-27 DIAGNOSIS — F341 Dysthymic disorder: Secondary | ICD-10-CM | POA: Diagnosis not present

## 2022-10-27 DIAGNOSIS — F900 Attention-deficit hyperactivity disorder, predominantly inattentive type: Secondary | ICD-10-CM | POA: Diagnosis not present

## 2022-10-28 NOTE — Telephone Encounter (Signed)
Prior Approval received for Vraylar 1.5 mg 

## 2022-10-28 NOTE — Telephone Encounter (Signed)
Pt called back reporting pharmacy told him Vraylar 1.5 mg was not covered anymore by his insurance. Explained to pt that a prior authorization just needs to be submitted first.   Prior Authorization initiated with [email or web address] EOC# [id number] Member ID [id number]

## 2022-11-03 DIAGNOSIS — F900 Attention-deficit hyperactivity disorder, predominantly inattentive type: Secondary | ICD-10-CM | POA: Diagnosis not present

## 2022-11-03 DIAGNOSIS — F341 Dysthymic disorder: Secondary | ICD-10-CM | POA: Diagnosis not present

## 2022-11-08 DIAGNOSIS — G4733 Obstructive sleep apnea (adult) (pediatric): Secondary | ICD-10-CM | POA: Diagnosis not present

## 2022-11-10 DIAGNOSIS — F341 Dysthymic disorder: Secondary | ICD-10-CM | POA: Diagnosis not present

## 2022-11-10 DIAGNOSIS — F900 Attention-deficit hyperactivity disorder, predominantly inattentive type: Secondary | ICD-10-CM | POA: Diagnosis not present

## 2022-11-11 ENCOUNTER — Ambulatory Visit
Admission: RE | Admit: 2022-11-11 | Discharge: 2022-11-11 | Disposition: A | Discharge status: Home / Self Care | Payer: No Typology Code available for payment source | Source: Ambulatory Visit

## 2022-11-11 DIAGNOSIS — I700 Atherosclerosis of aorta: Secondary | ICD-10-CM

## 2022-11-14 ENCOUNTER — Other Ambulatory Visit: Payer: Self-pay

## 2022-11-14 ENCOUNTER — Telehealth: Payer: Self-pay | Admitting: Psychiatry

## 2022-11-14 ENCOUNTER — Other Ambulatory Visit: Payer: Self-pay | Admitting: Psychiatry

## 2022-11-14 DIAGNOSIS — F902 Attention-deficit hyperactivity disorder, combined type: Secondary | ICD-10-CM

## 2022-11-14 MED ORDER — LISDEXAMFETAMINE DIMESYLATE 70 MG PO CAPS: 70.0000 mg | ORAL_CAPSULE | Freq: Every day | ORAL | 0 refills | Status: DC

## 2022-11-14 MED ORDER — AMPHETAMINE-DEXTROAMPHET ER 30 MG PO CP24: 30.0000 mg | ORAL_CAPSULE | Freq: Every day | ORAL | 0 refills | Status: DC

## 2022-11-14 NOTE — Telephone Encounter (Signed)
Pended.

## 2022-11-14 NOTE — Telephone Encounter (Signed)
Sent with message: Dispense [name] if available instead of Adderall.  If [name] not available then dispense Adderall.

## 2022-11-14 NOTE — Telephone Encounter (Signed)
Zachary Dixon is requesting a refill on Adderall XR 30 mg called to:  [id number] [place], [place] - [address] AT [address] [facility] [address]   Phone:[phone]   Per Zachary Dixon it is in stock at this location.  Next appt is 01/05/23

## 2022-11-17 DIAGNOSIS — F341 Dysthymic disorder: Secondary | ICD-10-CM | POA: Diagnosis not present

## 2022-11-17 DIAGNOSIS — F900 Attention-deficit hyperactivity disorder, predominantly inattentive type: Secondary | ICD-10-CM | POA: Diagnosis not present

## 2022-11-24 DIAGNOSIS — F900 Attention-deficit hyperactivity disorder, predominantly inattentive type: Secondary | ICD-10-CM | POA: Diagnosis not present

## 2022-11-24 DIAGNOSIS — F341 Dysthymic disorder: Secondary | ICD-10-CM | POA: Diagnosis not present

## 2022-12-08 DIAGNOSIS — F341 Dysthymic disorder: Secondary | ICD-10-CM | POA: Diagnosis not present

## 2022-12-08 DIAGNOSIS — F900 Attention-deficit hyperactivity disorder, predominantly inattentive type: Secondary | ICD-10-CM | POA: Diagnosis not present

## 2022-12-13 ENCOUNTER — Other Ambulatory Visit: Payer: Self-pay | Admitting: Psychiatry

## 2022-12-13 DIAGNOSIS — F319 Bipolar disorder, unspecified: Secondary | ICD-10-CM

## 2022-12-14 ENCOUNTER — Other Ambulatory Visit: Payer: Self-pay | Admitting: Psychiatry

## 2022-12-14 ENCOUNTER — Telehealth: Payer: Self-pay | Admitting: Psychiatry

## 2022-12-14 DIAGNOSIS — F902 Attention-deficit hyperactivity disorder, combined type: Secondary | ICD-10-CM

## 2022-12-14 MED ORDER — AMPHETAMINE-DEXTROAMPHET ER 30 MG PO CP24
30.0000 mg | ORAL_CAPSULE | Freq: Every day | ORAL | 0 refills | Status: DC
Start: 2022-12-14 — End: 2023-01-11

## 2022-12-14 NOTE — Telephone Encounter (Signed)
Patient lvm at 10:51 requesting refill for Dextroamphetamine 30mg  XR capsules. Ph: [phone] Appt 6/[facility] [address] [place]

## 2022-12-15 DIAGNOSIS — F900 Attention-deficit hyperactivity disorder, predominantly inattentive type: Secondary | ICD-10-CM | POA: Diagnosis not present

## 2022-12-15 DIAGNOSIS — F341 Dysthymic disorder: Secondary | ICD-10-CM | POA: Diagnosis not present

## 2022-12-29 DIAGNOSIS — F341 Dysthymic disorder: Secondary | ICD-10-CM | POA: Diagnosis not present

## 2022-12-29 DIAGNOSIS — F900 Attention-deficit hyperactivity disorder, predominantly inattentive type: Secondary | ICD-10-CM | POA: Diagnosis not present

## 2023-01-05 ENCOUNTER — Ambulatory Visit: Payer: BC Managed Care – PPO | Admitting: Psychiatry

## 2023-01-05 DIAGNOSIS — F900 Attention-deficit hyperactivity disorder, predominantly inattentive type: Secondary | ICD-10-CM | POA: Diagnosis not present

## 2023-01-05 DIAGNOSIS — F341 Dysthymic disorder: Secondary | ICD-10-CM | POA: Diagnosis not present

## 2023-01-10 ENCOUNTER — Telehealth: Payer: Self-pay | Admitting: Psychiatry

## 2023-01-10 NOTE — Telephone Encounter (Signed)
Pt LVM @ 1:01p requesting refill of Adderall to   [id number] [place], [place] - [address] AT [facility] [address] [facility] [address] [facility] [place] [address] Phone: [phone]  Fax: [phone]    Next appt 8/22

## 2023-01-10 NOTE — Telephone Encounter (Signed)
LF 6/5, due 7/3

## 2023-01-11 ENCOUNTER — Other Ambulatory Visit: Payer: Self-pay

## 2023-01-11 DIAGNOSIS — F902 Attention-deficit hyperactivity disorder, combined type: Secondary | ICD-10-CM

## 2023-01-11 MED ORDER — AMPHETAMINE-DEXTROAMPHET ER 30 MG PO CP24
30.0000 mg | ORAL_CAPSULE | Freq: Every day | ORAL | 0 refills | Status: DC
Start: 2023-01-11 — End: 2023-03-02

## 2023-01-11 NOTE — Telephone Encounter (Signed)
Pended.

## 2023-01-19 DIAGNOSIS — F900 Attention-deficit hyperactivity disorder, predominantly inattentive type: Secondary | ICD-10-CM | POA: Diagnosis not present

## 2023-01-19 DIAGNOSIS — F341 Dysthymic disorder: Secondary | ICD-10-CM | POA: Diagnosis not present

## 2023-02-02 DIAGNOSIS — F341 Dysthymic disorder: Secondary | ICD-10-CM | POA: Diagnosis not present

## 2023-02-02 DIAGNOSIS — F900 Attention-deficit hyperactivity disorder, predominantly inattentive type: Secondary | ICD-10-CM | POA: Diagnosis not present

## 2023-02-09 DIAGNOSIS — F900 Attention-deficit hyperactivity disorder, predominantly inattentive type: Secondary | ICD-10-CM | POA: Diagnosis not present

## 2023-02-09 DIAGNOSIS — F341 Dysthymic disorder: Secondary | ICD-10-CM | POA: Diagnosis not present

## 2023-02-10 ENCOUNTER — Telehealth: Payer: Self-pay | Admitting: Psychiatry

## 2023-02-10 ENCOUNTER — Other Ambulatory Visit: Payer: Self-pay

## 2023-02-10 DIAGNOSIS — F902 Attention-deficit hyperactivity disorder, combined type: Secondary | ICD-10-CM

## 2023-02-10 NOTE — Telephone Encounter (Signed)
Due 8/5, last filled Adderall 7/8

## 2023-02-10 NOTE — Telephone Encounter (Signed)
Called patient to get which pharmacy it should be sent to, [facility] on [address].

## 2023-02-10 NOTE — Telephone Encounter (Signed)
Zachary Dixon requesting a refill on the generic [name]. Appointment 03/02/23  Contact # [phone]

## 2023-02-12 ENCOUNTER — Other Ambulatory Visit: Payer: Self-pay

## 2023-02-12 DIAGNOSIS — F902 Attention-deficit hyperactivity disorder, combined type: Secondary | ICD-10-CM

## 2023-02-13 MED ORDER — LISDEXAMFETAMINE DIMESYLATE 70 MG PO CAPS
70.0000 mg | ORAL_CAPSULE | Freq: Every day | ORAL | 0 refills | Status: DC
Start: 2023-02-13 — End: 2023-03-02

## 2023-02-13 NOTE — Telephone Encounter (Signed)
Pended.

## 2023-02-16 DIAGNOSIS — F341 Dysthymic disorder: Secondary | ICD-10-CM | POA: Diagnosis not present

## 2023-02-16 DIAGNOSIS — F900 Attention-deficit hyperactivity disorder, predominantly inattentive type: Secondary | ICD-10-CM | POA: Diagnosis not present

## 2023-02-23 DIAGNOSIS — F900 Attention-deficit hyperactivity disorder, predominantly inattentive type: Secondary | ICD-10-CM | POA: Diagnosis not present

## 2023-02-23 DIAGNOSIS — F341 Dysthymic disorder: Secondary | ICD-10-CM | POA: Diagnosis not present

## 2023-03-02 ENCOUNTER — Ambulatory Visit (INDEPENDENT_AMBULATORY_CARE_PROVIDER_SITE_OTHER): Payer: BC Managed Care – PPO | Admitting: Psychiatry

## 2023-03-02 ENCOUNTER — Encounter: Payer: Self-pay | Admitting: Psychiatry

## 2023-03-02 DIAGNOSIS — F5105 Insomnia due to other mental disorder: Secondary | ICD-10-CM

## 2023-03-02 DIAGNOSIS — G4733 Obstructive sleep apnea (adult) (pediatric): Secondary | ICD-10-CM

## 2023-03-02 DIAGNOSIS — F411 Generalized anxiety disorder: Secondary | ICD-10-CM | POA: Diagnosis not present

## 2023-03-02 DIAGNOSIS — F319 Bipolar disorder, unspecified: Secondary | ICD-10-CM | POA: Diagnosis not present

## 2023-03-02 DIAGNOSIS — G2581 Restless legs syndrome: Secondary | ICD-10-CM

## 2023-03-02 DIAGNOSIS — F341 Dysthymic disorder: Secondary | ICD-10-CM | POA: Diagnosis not present

## 2023-03-02 DIAGNOSIS — F902 Attention-deficit hyperactivity disorder, combined type: Secondary | ICD-10-CM

## 2023-03-02 DIAGNOSIS — R7989 Other specified abnormal findings of blood chemistry: Secondary | ICD-10-CM

## 2023-03-02 DIAGNOSIS — F900 Attention-deficit hyperactivity disorder, predominantly inattentive type: Secondary | ICD-10-CM | POA: Diagnosis not present

## 2023-03-02 DIAGNOSIS — Z79899 Other long term (current) drug therapy: Secondary | ICD-10-CM

## 2023-03-02 MED ORDER — CLONAZEPAM 0.5 MG PO TABS
ORAL_TABLET | ORAL | 0 refills | Status: AC
Start: 2023-03-02 — End: ?

## 2023-03-02 MED ORDER — LITHIUM CARBONATE ER 300 MG PO TBCR
600.0000 mg | EXTENDED_RELEASE_TABLET | Freq: Every day | ORAL | 1 refills | Status: AC
Start: 2023-03-02 — End: ?

## 2023-03-02 MED ORDER — DIVALPROEX SODIUM ER 500 MG PO TB24
1000.0000 mg | ORAL_TABLET | Freq: Every day | ORAL | 1 refills | Status: AC
Start: 2023-03-02 — End: ?

## 2023-03-02 MED ORDER — LIOTHYRONINE SODIUM 25 MCG PO TABS
ORAL_TABLET | ORAL | 0 refills | Status: DC
Start: 2023-03-02 — End: 2023-09-01

## 2023-03-02 MED ORDER — LISDEXAMFETAMINE DIMESYLATE 70 MG PO CAPS
70.0000 mg | ORAL_CAPSULE | Freq: Every day | ORAL | 0 refills | Status: DC
Start: 2023-03-02 — End: 2023-07-14

## 2023-03-02 MED ORDER — CARIPRAZINE HCL 1.5 MG PO CAPS: 1.5000 mg | ORAL_CAPSULE | Freq: Every day | ORAL | 5 refills | Status: DC

## 2023-03-02 MED ORDER — LISDEXAMFETAMINE DIMESYLATE 70 MG PO CAPS
70.0000 mg | ORAL_CAPSULE | Freq: Every day | ORAL | 0 refills | Status: DC
Start: 2023-03-30 — End: 2023-05-18

## 2023-03-02 MED ORDER — LISDEXAMFETAMINE DIMESYLATE 70 MG PO CAPS
70.0000 mg | ORAL_CAPSULE | Freq: Every day | ORAL | 0 refills | Status: DC
Start: 2023-04-27 — End: 2023-05-18

## 2023-03-02 NOTE — Progress Notes (Signed)
Zachary Dixon [id number] February 29, 1966 57 y.o.   Subjective:   Patient ID:  Zachary Dixon is a 57 y.o. (DOB 07-Apr-1966) male.  Chief Complaint:  Chief Complaint  Patient presents with   Follow-up    Mood, sleep , ADD    Depression        Associated symptoms include no decreased concentration, no fatigue and no suicidal ideas.  Past medical history includes anxiety.   Anxiety Patient reports no confusion, decreased concentration, nausea, nervous/anxious behavior or suicidal ideas.    Medication Refill Pertinent negatives include no fatigue, nausea or sore throat.   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    at visit October 11, 2018.  Cytomel potentiation was started for depression 25 mcg daily to increase to 50 mcg daily.  In addition there were concerns about flatness and he was wondering if it is related to side effects versus depression.  We reduced the lithium temporarily to see if that was the cause of the flatness. Reductions in lithium and depakote did not help the flatness.  No noticeable effect from the Cytomel except it did help the thyroid panel.  At visit Dec 06, 2018.  [name] was switched to Mydayis to try to improve his function full before waking day. Felt it was less effective than [name].  Didn't feel it kick in and no longer duration at maximum 50 mg so returned to [name]. It's better.   Patient was  seen August 2020.  Because his TSH was suppressed we reduced Cytomel to 37.5 mcg daily.  Because of flatness we reduced the Vraylar to 1.5 mg daily to see if there could be improvement.  Patient seen December 2020.  No med changes were made.  08/28/19 seen with wife, Zachary Dixon Has been under more stress at work for 3 mos.  Not markedly depressed but falls asleep easily in evening and gets tired easily.  Sleeping well with CPAP but may not get to bed early enough.   Falls asleep in chair and then gets to CPAP.  Not enough sleep on nights he has to travel.  Wife concerns.  Doesn't seem happy or interactive.  Used to have good days.  Can be irritable but no anger outbursts.  No mood swings.  No sense of humor which had been there before.  Still anxious and holding it in. We decided to reduce Vraylar to 1.5 mg every other day to see if flatness was improved.  Mood lability was under control.  As of 10/17/19:  Better.  Lighter and more energy.  Wife agrees.  Not depressed lately.  Not even small downturns.  Very even.   Zachary Dixon notices better engagement.  Anxiety still there with work.  Big projects coming to close in next month.  Everyone at work overwhelmed.  Would love to have another job.  She says he's always been stressed by every job. Plans to look elsewhere.   100% use status of CPAP. Compliant with meds.  No med concerns. Plan: Better with Reduced Vraylar re: flatness is better.  1.5 mg every other day. No med changes today  01/09/2020 appointment with the following noted: Consistent with meds.  Pretty good without complaints. Still has prominent gag reflex but has had it his whole life but seems worse than in years past.  Makes it even hard to wear CPAP.  Not wearing it 8 hours but 5-6 hours  now and 2-3 weeks ago wasn't wearing it.  Feels much better after not wearing it. Getting 8 hours sleep per night. Mood, anxiety and focus steady otherwise. No SE with meds. Plan Rec trial with NAC for cognition and mood.  Disc in detail  06/15/2020 appointment with the following noted: Nan dx breast CA, stage 1.  No chemo planned.  She's dealing with it pretty well. Work remained stressful.  Lost 3 people but trying to keep perspective with wife's health issues.  Still pursuing another job but little time and energy for that. Mood very good overall.  Primary thing is stress and has to be careful with it, otherwise anxiety can spend out of control.  Sleeping  well. Endoscopy OK in workup with gagging problem.  Gagging interferes with CPAP use.  Can be worse with stress.  Can be difficult to convince doctor's a problem. PCP Dr. Renne Crigler is good.  Overreactive gag reflex.  But sometimes can occur out of sleep.  Plan no med changes except again recommend he try NAC for cognitive reasons as noted before.  10/15/2020 appointment with following noted: Zachary Dixon doing well with treatment and finished tx. Still doing therapy.  Some struggle with depression.  Has interests and not as flat as years ago. Really tired and exhausted.  Clearly some related to job and searching. Still deals with gag reflex and sleep mask and sees ENT next week.  Never had problems with claustrophobia but problems with turtle necks, dentist, CPAP now .  Barely wear the CPAP. Doing DBT.  But not following through with all of it. Plan: No med changes Better with Reduced Vraylar re: flatness is better.  1.5 mg every other day.  02/18/2021 appointment with the following noted: Unsure if covid but had sx pneumonia a few mos ago with negative tests. Lately struggling and maybe bc not using CPAP bc mask phobic and still struggles with gagging even with Covid mask.  Has worked on it.  Has tried meditation to help the gagging. Very tired without [name].  Function at home affected by some more stress and conflict.  Chronic work stress may get better with changes and still searching for new job without success.  Spent after the work day.  Not present with family.  Zachary Dixon is upset.  Occ periods of despondency.   Couple weekends just walked off for 4 hours and didn't tell wife what he was doing and scared her.  Over did it. Can still enjoy and has interests. P;an: Better with Reduced Vraylar re: flatness is better.  1.5 mg every other day. No clear indications for med changes  04/29/21 appt noted: Big improvement with CPAP. Energy better. Therapist helped with meditation at night.  Brain-spotting maybe  helping with less gagging.  Most nights sleeping with the mask all night.  Pleased gagging better. Same job with a lot of stress.  Overall doing pretty well. Started men's DBT group at [facility] counseling. Pleased with Vraylar stopped mood swings. No recent mood swings. Except when forgot [name] for 3 days got depressed and couldn't function.. Patient reports stable mood and denies depressed or irritable moods.  Patient denies any recent difficulty with anxiety except work stress.  Patient denies difficulty with sleep initiation or maintenance. Denies appetite disturbance.  Patient reports that energy and motivation have been better with CPAP.  Patient denies any difficulty with concentration.  Patient denies any suicidal ideation.  08/05/21 appt noted: Mental health pretty good.  Still a lot of stress  at work.  Trying to look for another job as goal.  Several years ago would have been stress and crushed but managing better. Minor mood swings and not depressed.  Tired and work 60 hours per week. No problems known with meds. Doesn't like CPAP DT gagging which is better and wants Inspire. Harriett Sine doing well with breast CA. No problems with the meds. Sleep is OK except hard to get enough.  12/08/21 appt noted: Last few weeks a lot of stress and not managing well.  Nan hosp with infection.  Lost her job.  $ stress. Usual work stress too. Kind of depression this weekend and not out of control.  Getting better.  Mood changes usually a few hours. CPAP mask broke and delay getting it.  Sleeep a problem for a couple of weeks. Nan had Covid a couple of times and been worn down this year.   Son 54 yo and struggling emotionally. Still good interests and enjoyment.  03/10/22 appt noted:  Disc shortage of [name] but on 70 mg daily. Doing well.  With mood. Son off to college and going well.   Major work deadline met in August and less demand so time to look for new job now.  Was 2 year project. Satisfied  with meds. Sleep not too bad. Wants to look into Inspire but is using CPAP. Less anxious. Improving exercise. Plan: Better with Reduced Vraylar re: flatness is better.  1.5 mg every other day. No clear indications for med changes.  No reduction until less stressful job. Continue [name]  70 mg, Depakote ER 1000, Cytomel 37.5, lithium 600, Trazodone  hs PE with Dr. Renne Crigler November 2022 for labs. Check lithium level, TSH, D  07/07/22 appt noted: Meds as noted above and compliant. Had PE recently with Dr. Renne Crigler. Has been satisfied with [name]. Mild seasonal depression and not too bad.   Still at same job and still looking for antoher job.  It's not been as bad in last 6 mos. Sleep so so and not great.  Saw ENT about Inspire in [place] but awaitng consult in March.  It's a process.  Trying to use CPAP but it's not great.  Acutely aware of when he does not sleep well.  Will be getting antoher sleep study. No major mood swings. No SE concerns.  Is having more nocturia than normal.   Vraylar has done wonders for mood. Still excessive gag reflex interferes with CPAP Nan slowly recovering from sepsis and starting work again. Plan no changes  03/02/23 appt  noted: Meds: Vraylar 1.5 mg every other day, [name]  70 mg, Depakote ER 1000, Cytomel 37.5, lithium 600, Trazodone  hs No SE. Not bad overall.  Only issue is gagging affecting sleep.  Exttreme gag reflex all life but since Covid it is worse.   Ongoing.  Couldn't get Inspire bc apnea not bad enough.  Most nights not CPAP all night but 4-6 hours.   Trying to generate wt loss to see if it is better.   No major mood swings.  No trouble going to sleep.  7 hours or more of sleep.     Past psychiatric med medication trials include failure of ECT and TMS,  Latuda 120 mg,  aripiprazole, olanzapine 20 mg, Symbyax Seroquel, Rexulti, Vraylar 3 pramipexole,  lamotrigine, , Depakote 1500 ataxia,  lithium at high blood levels,  Pristiq,  sertraline, bupropion 450 mg caused anxiety, Lexapro, Viibryd,  Deplin, Duloxetine, Trintellix, [name] 70, Mydayis Concerta 72 mg,  Adderall 70% effect of [name] History of some DBT  Review of Systems:  Review of Systems  Constitutional:  Negative for fatigue.  HENT:  Negative for sore throat.        Still gagging  Gastrointestinal:  Negative for nausea.       Gagging comes and goes ongoing  Genitourinary:  Positive for frequency.  Neurological:  Negative for tremors.  Psychiatric/Behavioral:  Negative for agitation, behavioral problems, confusion, decreased concentration, dysphoric mood, hallucinations, self-injury, sleep disturbance and suicidal ideas. The patient is not nervous/anxious and is not hyperactive.     Medications: I have reviewed the patient's current medications.  Current Outpatient Medications  Medication Sig Dispense Refill   clonazePAM (KLONOPIN) 0.5 MG tablet 1-2 tablets at night for sleep 60 tablet 0   cyanocobalamin (VITAMIN B12) 1000 MCG tablet Take by mouth.     Folic Acid (FOLATE PO)      L-methylfolate Calcium 15 MG TABS TAKE 1 TABLET BY MOUTH EVERY DAY 30 tablet 11   Omega-3 1000 MG CAPS Take by mouth.     rosuvastatin (CRESTOR) 10 MG tablet Take 10 mg by mouth daily.     traZODone (DESYREL) 50 MG tablet TAKE 1 TO 3 TABLETS BY MOUTH AT BEDTIME     Vitamin D, Ergocalciferol, (DRISDOL) 1.25 MG (50000 UNIT) CAPS capsule TAKE 1 CAPSULE (50,000 UNITS TOTAL) BY MOUTH EVERY 7 (SEVEN) DAYS 15 capsule 3   [name] 70 MG capsule Take 1 capsule (70 mg total) by mouth daily. 30 capsule 0   [name] 70 MG capsule Take 1 capsule (70 mg total) by mouth daily. 30 capsule 0   cariprazine (VRAYLAR) 1.5 MG capsule Take 1 capsule (1.5 mg total) by mouth daily. 30 capsule 5   divalproex (DEPAKOTE ER) 500 MG 24 hr tablet Take 2 tablets (1,000 mg total) by mouth at bedtime. 180 tablet 1   liothyronine (CYTOMEL) 25 MCG tablet TAKE 1 AND 1/2 TABLETS BY MOUTH EVERY MORNING 135 tablet  0   [START ON 04/27/2023] lisdexamfetamine ([name]) 70 MG capsule Take 1 capsule (70 mg total) by mouth daily. 30 capsule 0   [START ON 03/30/2023] lisdexamfetamine ([name]) 70 MG capsule Take 1 capsule (70 mg total) by mouth daily. 30 capsule 0   lisdexamfetamine ([name]) 70 MG capsule Take 1 capsule (70 mg total) by mouth daily. 30 capsule 0   lithium carbonate (LITHOBID) 300 MG ER tablet Take 2 tablets (600 mg total) by mouth at bedtime. 180 tablet 1   No current facility-administered medications for this visit.    Medication Side Effects: None  Tremor not gone but less  Allergies: No Known Allergies  Past Medical History:  Diagnosis Date   ADHD    Allergy    Anxiety    Bipolar 2 disorder (HCC)    Depression    Diabetes mellitus without complication (HCC)    GERD (gastroesophageal reflux disease)    Hyperlipidemia    diet controlled   Sleep apnea    uses CPAP nightly   Thyroid disease     Family History  Problem Relation Age of Onset   Asthma Father    Emphysema Father    Colon cancer Maternal Aunt    Breast cancer Maternal Grandmother    Colon cancer Maternal Grandfather    Heart disease Paternal Grandmother    Heart disease Paternal Grandfather    Rectal cancer Neg Hx    Stomach cancer Neg Hx    Esophageal cancer Neg Hx  Social History   Socioeconomic History   Marital status: Married    Spouse name: Not on file   Number of children: 1   Years of education: Not on file   Highest education level: Not on file  Occupational History   Occupation: marketing  Tobacco Use   Smoking status: Former    Current packs/day: 0.00    Average packs/day: 1 pack/day for 7.0 years (7.0 ttl pk-yrs)    Types: Cigarettes    Start date: 07/11/1985    Quit date: 07/11/1992    Years since quitting: 30.6   Smokeless tobacco: Never  Vaping Use   Vaping status: Never Used  Substance and Sexual Activity   Alcohol use: Yes    Alcohol/week: 4.0 standard drinks of alcohol     Types: 4 Standard drinks or equivalent per week   Drug use: No   Sexual activity: Not Currently  Other Topics Concern   Not on file  Social History Narrative   Not on file   Social Determinants of Health   Financial Resource Strain: Not on file  Food Insecurity: Not on file  Transportation Needs: Not on file  Physical Activity: Not on file  Stress: Not on file  Social Connections: Not on file  Intimate Partner Violence: Not on file    Past Medical History, Surgical history, Social history, and Family history were reviewed and updated as appropriate.   Son sam.  Please see review of systems for further details on the patient's review from today.   Objective:   Physical Exam:  There were no vitals taken for this visit.  Physical Exam Constitutional:      General: He is not in acute distress.    Appearance: Normal appearance.  Musculoskeletal:        General: No deformity.  Neurological:     Mental Status: He is alert and oriented to person, place, and time.     Cranial Nerves: No dysarthria.     Coordination: Coordination normal.  Psychiatric:        Attention and Perception: Attention and perception normal. He does not perceive auditory hallucinations.        Mood and Affect: Mood is not anxious or depressed. Affect is not labile, blunt, angry or inappropriate.        Speech: Speech normal.        Behavior: Behavior normal. Behavior is cooperative.        Thought Content: Thought content normal. Thought content is not paranoid or delusional. Thought content does not include homicidal or suicidal ideation. Thought content does not include suicidal plan.        Cognition and Memory: Cognition and memory normal.        Judgment: Judgment normal.     Comments: Insight intact      Lab Review:     Component Value Date/Time   NA 141 07/08/2022 0932   K 4.3 07/08/2022 0932   CL 107 07/08/2022 0932   CO2 26 07/08/2022 0932   GLUCOSE 106 (H) 07/08/2022 0932   BUN 21  07/08/2022 0932   CREATININE 1.15 07/08/2022 0932   CALCIUM 9.3 07/08/2022 0932   PROT 6.7 08/26/2016 1959   ALBUMIN 3.8 08/26/2016 1959   AST 18 08/26/2016 1959   ALT 21 08/26/2016 1959   ALKPHOS 60 08/26/2016 1959   BILITOT 0.6 08/26/2016 1959   GFRNONAA >60 08/26/2016 1959   GFRAA >60 08/26/2016 1959       Component Value Date/Time  WBC 8.6 08/26/2016 1959   RBC 4.35 08/26/2016 1959   HGB 13.7 08/26/2016 1959   HCT 39.8 08/26/2016 1959   PLT 221 08/26/2016 1959   MCV 91.5 08/26/2016 1959   MCH 31.5 08/26/2016 1959   MCHC 34.4 08/26/2016 1959   RDW 12.6 08/26/2016 1959   LYMPHSABS 2.4 08/26/2016 1959   MONOABS 0.4 08/26/2016 1959   EOSABS 0.2 08/26/2016 1959   BASOSABS 0.0 08/26/2016 1959    Lithium Lvl  Date Value Ref Range Status  07/08/2022 <0.3 (L) 0.6 - 1.2 mmol/L Final     Lab Results  Component Value Date   VALPROATE 54.6 04/02/2018    TSH at 3   Normal ammonia 47 in 2019.  .res Assessment: Plan:    Bipolar I disorder with depression (HCC) - Plan: cariprazine (VRAYLAR) 1.5 MG capsule, divalproex (DEPAKOTE ER) 500 MG 24 hr tablet, liothyronine (CYTOMEL) 25 MCG tablet, lithium carbonate (LITHOBID) 300 MG ER tablet  Generalized anxiety disorder  Attention deficit hyperactivity disorder (ADHD), combined type - Plan: lisdexamfetamine ([name]) 70 MG capsule, lisdexamfetamine ([name]) 70 MG capsule, lisdexamfetamine ([name]) 70 MG capsule  Insomnia due to mental condition - Plan: clonazePAM (KLONOPIN) 0.5 MG tablet  Restless legs syndrome  Obstructive sleep apnea  Low vitamin D level  Lithium use   Greater than 50% of face to face time with patient was spent on counseling and coordination of care. 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  His symptoms have been severe and very resistant to any benefit.    He cannot tolerate an increase in the  Depakote based on prior ataxic experiences at 1500 mg daily.  Higher doses of lithium have not helped anymore.  He has failed multiple medications and ECT and TMS. HowEver the flatness is much improved with the reduction in in Vraylar to 1.5 mg every other day.  He has had no worsening anxiety, mood swings nor depression.  He was not having extreme cycling like he did in the past.  We previously had reduced lithium to 600 mg daily and reduce Depakote to 500 mg daily so those were not changed. Overall his mood is worse a bit and may be due to reduced use of CPAP bc gagging.  He has been less and less compliant with his CPAP lately due to gagging problem  . RLS managed.  Pramipexole prn for RLS this is managed at this time.  Continue vitamin D check level later.  Continue B12. Extensive discussions about possible supplements that could be helpful.  He will continue the following. B-Complex 150 Still rec N-Acetylcysteine at 1000-1200 mg daily to help with mild cognitive problems and depression.  It can be combined with a B-complex vitamin as the B-12 and folate have been shown to sometimes enhance the effect. L-methylfolate 15 mg daily for depression.  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  Discussed his polypharmacy which is not ideal but has been necessary to control the mood cycling and now his depression has improved.  Plus he also has ADD that has to be managed.  He is satisfied with his ADD management with [name].  No clear indications for med changes.  No reduction until less stressful job. Better with Reduced Vraylar re: flatness is better.  1.5 mg every other day.  Continue [name]  70 mg, Depakote ER 1000, Cytomel 37.5, lithium 600,  Trial  clonazepam 0.5-1.0 HS to see if this will sig help gagging at night which interferes with using CPAP nocturia better  PE with Dr. Renne Crigler November 2022 for labs. Check lithium level, TSH, D emphasized.  Still don't  have any labs for 2023  FU 6 months.    Meredith Staggers, MD, DFAPA   Please see After Visit Summary for patient specific instructions.  No future appointments.    No orders of the defined types were placed in this encounter.      -------------------------------

## 2023-03-09 DIAGNOSIS — F341 Dysthymic disorder: Secondary | ICD-10-CM | POA: Diagnosis not present

## 2023-03-09 DIAGNOSIS — F900 Attention-deficit hyperactivity disorder, predominantly inattentive type: Secondary | ICD-10-CM | POA: Diagnosis not present

## 2023-03-15 ENCOUNTER — Telehealth: Payer: Self-pay | Admitting: Psychiatry

## 2023-03-15 NOTE — Telephone Encounter (Signed)
Patient has 3 RF available, left VM per DPR.

## 2023-03-15 NOTE — Telephone Encounter (Signed)
Zachary Dixon called at 9:20 to request refill of his [name] 70mg .  Appt 09/07/23.  Send to [facility] #[id number] - [place], [place] - [address] AT [facility] OF [address] & [facility] [address]

## 2023-03-16 DIAGNOSIS — F341 Dysthymic disorder: Secondary | ICD-10-CM | POA: Diagnosis not present

## 2023-03-16 DIAGNOSIS — F900 Attention-deficit hyperactivity disorder, predominantly inattentive type: Secondary | ICD-10-CM | POA: Diagnosis not present

## 2023-03-23 DIAGNOSIS — F900 Attention-deficit hyperactivity disorder, predominantly inattentive type: Secondary | ICD-10-CM | POA: Diagnosis not present

## 2023-03-23 DIAGNOSIS — F341 Dysthymic disorder: Secondary | ICD-10-CM | POA: Diagnosis not present

## 2023-03-27 DIAGNOSIS — F32A Depression, unspecified: Secondary | ICD-10-CM | POA: Diagnosis not present

## 2023-03-27 DIAGNOSIS — K219 Gastro-esophageal reflux disease without esophagitis: Secondary | ICD-10-CM | POA: Diagnosis not present

## 2023-03-27 DIAGNOSIS — G4733 Obstructive sleep apnea (adult) (pediatric): Secondary | ICD-10-CM | POA: Diagnosis not present

## 2023-03-27 DIAGNOSIS — G2581 Restless legs syndrome: Secondary | ICD-10-CM | POA: Diagnosis not present

## 2023-03-30 DIAGNOSIS — F341 Dysthymic disorder: Secondary | ICD-10-CM | POA: Diagnosis not present

## 2023-03-30 DIAGNOSIS — F900 Attention-deficit hyperactivity disorder, predominantly inattentive type: Secondary | ICD-10-CM | POA: Diagnosis not present

## 2023-04-03 DIAGNOSIS — F32A Depression, unspecified: Secondary | ICD-10-CM | POA: Diagnosis not present

## 2023-04-03 DIAGNOSIS — G2581 Restless legs syndrome: Secondary | ICD-10-CM | POA: Diagnosis not present

## 2023-04-03 DIAGNOSIS — K219 Gastro-esophageal reflux disease without esophagitis: Secondary | ICD-10-CM | POA: Diagnosis not present

## 2023-04-03 DIAGNOSIS — G4733 Obstructive sleep apnea (adult) (pediatric): Secondary | ICD-10-CM | POA: Diagnosis not present

## 2023-04-06 DIAGNOSIS — F341 Dysthymic disorder: Secondary | ICD-10-CM | POA: Diagnosis not present

## 2023-04-06 DIAGNOSIS — F900 Attention-deficit hyperactivity disorder, predominantly inattentive type: Secondary | ICD-10-CM | POA: Diagnosis not present

## 2023-04-10 DIAGNOSIS — F32A Depression, unspecified: Secondary | ICD-10-CM | POA: Diagnosis not present

## 2023-04-10 DIAGNOSIS — G2581 Restless legs syndrome: Secondary | ICD-10-CM | POA: Diagnosis not present

## 2023-04-10 DIAGNOSIS — G4733 Obstructive sleep apnea (adult) (pediatric): Secondary | ICD-10-CM | POA: Diagnosis not present

## 2023-04-10 DIAGNOSIS — K219 Gastro-esophageal reflux disease without esophagitis: Secondary | ICD-10-CM | POA: Diagnosis not present

## 2023-04-11 ENCOUNTER — Telehealth: Payer: Self-pay | Admitting: Allergy and Immunology

## 2023-04-11 MED ORDER — AIRSUPRA 90-80 MCG/ACT IN AERO: 2.0000 | INHALATION_SPRAY | Freq: Four times a day (QID) | RESPIRATORY_TRACT | 0 refills | Status: AC | PRN

## 2023-04-11 MED ORDER — NIRMATRELVIR/RITONAVIR (PAXLOVID)TABLET: 3.0000 | ORAL_TABLET | Freq: Two times a day (BID) | ORAL | 0 refills | Status: AC

## 2023-04-11 NOTE — Telephone Encounter (Addendum)
Prescriptions sent to pharmacy. Patient informed.

## 2023-04-11 NOTE — Telephone Encounter (Signed)
Patient states he tested positive for COVID on Saturday. He is having chest tightness, severe cough, body aches, a lot of congestion, and fever. He is requesting a rescue inhaler as he's used one in the past and it helped him a lot.    [facility] [address]

## 2023-04-17 ENCOUNTER — Telehealth: Payer: Self-pay | Admitting: Psychiatry

## 2023-04-17 NOTE — Telephone Encounter (Signed)
Patient has RF available. LVM with info.

## 2023-04-17 NOTE — Telephone Encounter (Signed)
Next visit is 2/27. Requesting refill for [name] 70 mg called to:  [facility] #[id number] [place], [place] - [address] AT [facility] OF [facility] [address] & [facility] [address]   Phone: [phone]  Fax: [phone]    Per patient it is in stock.

## 2023-04-20 DIAGNOSIS — F900 Attention-deficit hyperactivity disorder, predominantly inattentive type: Secondary | ICD-10-CM | POA: Diagnosis not present

## 2023-04-20 DIAGNOSIS — F341 Dysthymic disorder: Secondary | ICD-10-CM | POA: Diagnosis not present

## 2023-04-28 DIAGNOSIS — U099 Post covid-19 condition, unspecified: Secondary | ICD-10-CM | POA: Diagnosis not present

## 2023-04-28 DIAGNOSIS — R053 Chronic cough: Secondary | ICD-10-CM | POA: Diagnosis not present

## 2023-05-11 DIAGNOSIS — F900 Attention-deficit hyperactivity disorder, predominantly inattentive type: Secondary | ICD-10-CM | POA: Diagnosis not present

## 2023-05-11 DIAGNOSIS — F341 Dysthymic disorder: Secondary | ICD-10-CM | POA: Diagnosis not present

## 2023-05-18 ENCOUNTER — Other Ambulatory Visit: Payer: Self-pay | Admitting: Psychiatry

## 2023-05-18 ENCOUNTER — Other Ambulatory Visit: Payer: Self-pay

## 2023-05-18 ENCOUNTER — Telehealth: Payer: Self-pay | Admitting: Psychiatry

## 2023-05-18 DIAGNOSIS — F900 Attention-deficit hyperactivity disorder, predominantly inattentive type: Secondary | ICD-10-CM | POA: Diagnosis not present

## 2023-05-18 DIAGNOSIS — F902 Attention-deficit hyperactivity disorder, combined type: Secondary | ICD-10-CM

## 2023-05-18 DIAGNOSIS — F341 Dysthymic disorder: Secondary | ICD-10-CM | POA: Diagnosis not present

## 2023-05-18 MED ORDER — LISDEXAMFETAMINE DIMESYLATE 70 MG PO CAPS
70.0000 mg | ORAL_CAPSULE | Freq: Every day | ORAL | 0 refills | Status: DC
Start: 2023-05-18 — End: 2023-08-15

## 2023-05-18 MED ORDER — LISDEXAMFETAMINE DIMESYLATE 70 MG PO CAPS
70.0000 mg | ORAL_CAPSULE | Freq: Every day | ORAL | 0 refills | Status: DC
Start: 2023-06-15 — End: 2023-09-13

## 2023-05-18 NOTE — Telephone Encounter (Signed)
Zachary Dixon called at 2:20 to report that the [facility] pharmacy on [address] doesn't have the [name] anymore.  He asked that the prescription be transferred to  the [facility] on [address] [place]. [place]

## 2023-05-18 NOTE — Telephone Encounter (Signed)
CANCELED RX AT [facility] AND PENDED FOR Mylinda Latina

## 2023-05-25 DIAGNOSIS — F341 Dysthymic disorder: Secondary | ICD-10-CM | POA: Diagnosis not present

## 2023-05-25 DIAGNOSIS — F900 Attention-deficit hyperactivity disorder, predominantly inattentive type: Secondary | ICD-10-CM | POA: Diagnosis not present

## 2023-06-01 DIAGNOSIS — F900 Attention-deficit hyperactivity disorder, predominantly inattentive type: Secondary | ICD-10-CM | POA: Diagnosis not present

## 2023-06-01 DIAGNOSIS — F341 Dysthymic disorder: Secondary | ICD-10-CM | POA: Diagnosis not present

## 2023-06-02 ENCOUNTER — Other Ambulatory Visit: Payer: Self-pay | Admitting: Psychiatry

## 2023-06-02 DIAGNOSIS — R7989 Other specified abnormal findings of blood chemistry: Secondary | ICD-10-CM

## 2023-06-07 DIAGNOSIS — Z125 Encounter for screening for malignant neoplasm of prostate: Secondary | ICD-10-CM | POA: Diagnosis not present

## 2023-06-07 DIAGNOSIS — Z0000 Encounter for general adult medical examination without abnormal findings: Secondary | ICD-10-CM | POA: Diagnosis not present

## 2023-06-07 DIAGNOSIS — R739 Hyperglycemia, unspecified: Secondary | ICD-10-CM | POA: Diagnosis not present

## 2023-06-12 ENCOUNTER — Telehealth: Payer: Self-pay | Admitting: Psychiatry

## 2023-06-12 ENCOUNTER — Other Ambulatory Visit: Payer: Self-pay

## 2023-06-12 NOTE — Telephone Encounter (Signed)
I postponed it until 12/5.

## 2023-06-12 NOTE — Telephone Encounter (Signed)
Thank you so much. I wasn't sure if I postponed it that it would show up on your "notifications".

## 2023-06-12 NOTE — Telephone Encounter (Signed)
Lf 11/7 due 12/5

## 2023-06-12 NOTE — Telephone Encounter (Signed)
Patient called in for refill on generic [name] 70mg . Ph: [phone] Appt 2/27 Pharmacy [facility] [address] [place]

## 2023-06-15 DIAGNOSIS — F900 Attention-deficit hyperactivity disorder, predominantly inattentive type: Secondary | ICD-10-CM | POA: Diagnosis not present

## 2023-06-15 DIAGNOSIS — F341 Dysthymic disorder: Secondary | ICD-10-CM | POA: Diagnosis not present

## 2023-06-29 DIAGNOSIS — F341 Dysthymic disorder: Secondary | ICD-10-CM | POA: Diagnosis not present

## 2023-06-29 DIAGNOSIS — F900 Attention-deficit hyperactivity disorder, predominantly inattentive type: Secondary | ICD-10-CM | POA: Diagnosis not present

## 2023-07-07 DIAGNOSIS — Z0000 Encounter for general adult medical examination without abnormal findings: Secondary | ICD-10-CM | POA: Diagnosis not present

## 2023-07-14 ENCOUNTER — Other Ambulatory Visit: Payer: Self-pay

## 2023-07-14 ENCOUNTER — Telehealth: Payer: Self-pay | Admitting: Psychiatry

## 2023-07-14 DIAGNOSIS — F902 Attention-deficit hyperactivity disorder, combined type: Secondary | ICD-10-CM

## 2023-07-14 MED ORDER — LISDEXAMFETAMINE DIMESYLATE 70 MG PO CAPS
70.0000 mg | ORAL_CAPSULE | Freq: Every day | ORAL | 0 refills | Status: DC
Start: 2023-07-14 — End: 2023-09-13

## 2023-07-14 NOTE — Telephone Encounter (Signed)
 Zachary Dixon called at 10:28 to request refill of his [name],  appt 09/07/23.  Send to [facility] #[id number] - [place], [place] - [address] AT [facility] OF [address]   He verified that they have it

## 2023-07-14 NOTE — Telephone Encounter (Signed)
 Pended vyvanse 70 mg to rqst pharmacy.

## 2023-08-15 ENCOUNTER — Telehealth: Payer: Self-pay | Admitting: Psychiatry

## 2023-08-15 ENCOUNTER — Other Ambulatory Visit: Payer: Self-pay

## 2023-08-15 DIAGNOSIS — F902 Attention-deficit hyperactivity disorder, combined type: Secondary | ICD-10-CM

## 2023-08-15 MED ORDER — LISDEXAMFETAMINE DIMESYLATE 70 MG PO CAPS
70.0000 mg | ORAL_CAPSULE | Freq: Every day | ORAL | 0 refills | Status: DC
Start: 2023-08-15 — End: 2023-09-13

## 2023-08-15 NOTE — Telephone Encounter (Signed)
Next appt is 09/07/23. Alinda Money called requesting a refill on his Generic [name] 70 mg called to:  [facility] #[id number] - [place], [place] - [address] AT [facility] OF [address]  Phone: [phone]  Fax: [phone]

## 2023-08-15 NOTE — Telephone Encounter (Signed)
Pended vyvanse 70mg  to rqstd pharm

## 2023-08-27 ENCOUNTER — Other Ambulatory Visit: Payer: Self-pay | Admitting: Psychiatry

## 2023-08-27 DIAGNOSIS — R7989 Other specified abnormal findings of blood chemistry: Secondary | ICD-10-CM

## 2023-09-01 ENCOUNTER — Other Ambulatory Visit: Payer: Self-pay | Admitting: Psychiatry

## 2023-09-01 DIAGNOSIS — F319 Bipolar disorder, unspecified: Secondary | ICD-10-CM

## 2023-09-07 ENCOUNTER — Ambulatory Visit: Payer: BC Managed Care – PPO | Admitting: Psychiatry

## 2023-09-13 ENCOUNTER — Other Ambulatory Visit: Payer: Self-pay

## 2023-09-13 ENCOUNTER — Telehealth: Payer: Self-pay | Admitting: Psychiatry

## 2023-09-13 DIAGNOSIS — F902 Attention-deficit hyperactivity disorder, combined type: Secondary | ICD-10-CM

## 2023-09-13 MED ORDER — LISDEXAMFETAMINE DIMESYLATE 70 MG PO CAPS: 70.0000 mg | ORAL_CAPSULE | Freq: Every day | ORAL | 0 refills | Status: DC

## 2023-09-13 NOTE — Telephone Encounter (Signed)
 [name] [address].

## 2023-09-13 NOTE — Telephone Encounter (Signed)
 Pt called at 10:10a requesting refill of generic [name] to   [facility] #[id number] [place], [place] - [address] AT [facility] OF [facility] [address] [place] [address] Phone: [phone]  Fax: [phone]   Next appt 5/8

## 2023-09-26 DIAGNOSIS — R053 Chronic cough: Secondary | ICD-10-CM | POA: Diagnosis not present

## 2023-09-26 DIAGNOSIS — J329 Chronic sinusitis, unspecified: Secondary | ICD-10-CM | POA: Diagnosis not present

## 2023-10-05 ENCOUNTER — Other Ambulatory Visit: Payer: Self-pay | Admitting: Psychiatry

## 2023-10-05 DIAGNOSIS — F319 Bipolar disorder, unspecified: Secondary | ICD-10-CM

## 2023-10-12 ENCOUNTER — Other Ambulatory Visit: Payer: Self-pay

## 2023-10-12 ENCOUNTER — Telehealth: Payer: Self-pay | Admitting: Psychiatry

## 2023-10-12 DIAGNOSIS — F902 Attention-deficit hyperactivity disorder, combined type: Secondary | ICD-10-CM

## 2023-10-12 NOTE — Telephone Encounter (Signed)
 Pended [name] 70 to [facility]. Had RF at [facility], need to cancel [phone]

## 2023-10-12 NOTE — Telephone Encounter (Signed)
 Patient lvm requesting the generic [name] 70 mg. Patient checked ,and stated it is available at [facility] [id number] [place], [place] - [address] [place] [address] Phone: [phone]  Fax: [phone]

## 2023-10-13 MED ORDER — LISDEXAMFETAMINE DIMESYLATE 70 MG PO CAPS: 70.0000 mg | ORAL_CAPSULE | Freq: Every day | ORAL | 0 refills | Status: DC

## 2023-10-16 ENCOUNTER — Other Ambulatory Visit: Payer: Self-pay

## 2023-10-16 DIAGNOSIS — F902 Attention-deficit hyperactivity disorder, combined type: Secondary | ICD-10-CM

## 2023-10-16 MED ORDER — LISDEXAMFETAMINE DIMESYLATE 70 MG PO CAPS: 70.0000 mg | ORAL_CAPSULE | Freq: Every day | ORAL | 0 refills | Status: DC

## 2023-10-16 NOTE — Telephone Encounter (Signed)
Pended printed Rx.

## 2023-10-16 NOTE — Telephone Encounter (Signed)
 Pt called at 9:15a and said he would like to pick up a paper script to find the generic [name].  He said he is down to 1 pill.  Next appt 5/8

## 2023-11-01 ENCOUNTER — Other Ambulatory Visit: Payer: Self-pay | Admitting: Psychiatry

## 2023-11-01 DIAGNOSIS — F319 Bipolar disorder, unspecified: Secondary | ICD-10-CM

## 2023-11-16 ENCOUNTER — Encounter: Payer: Self-pay | Admitting: Psychiatry

## 2023-11-16 ENCOUNTER — Ambulatory Visit: Payer: BC Managed Care – PPO | Admitting: Psychiatry

## 2023-11-16 DIAGNOSIS — F411 Generalized anxiety disorder: Secondary | ICD-10-CM

## 2023-11-16 DIAGNOSIS — R7989 Other specified abnormal findings of blood chemistry: Secondary | ICD-10-CM

## 2023-11-16 DIAGNOSIS — G4733 Obstructive sleep apnea (adult) (pediatric): Secondary | ICD-10-CM

## 2023-11-16 DIAGNOSIS — F319 Bipolar disorder, unspecified: Secondary | ICD-10-CM | POA: Diagnosis not present

## 2023-11-16 DIAGNOSIS — F5105 Insomnia due to other mental disorder: Secondary | ICD-10-CM

## 2023-11-16 DIAGNOSIS — F902 Attention-deficit hyperactivity disorder, combined type: Secondary | ICD-10-CM | POA: Diagnosis not present

## 2023-11-16 DIAGNOSIS — Z79899 Other long term (current) drug therapy: Secondary | ICD-10-CM

## 2023-11-16 DIAGNOSIS — G2581 Restless legs syndrome: Secondary | ICD-10-CM

## 2023-11-16 DIAGNOSIS — R198 Other specified symptoms and signs involving the digestive system and abdomen: Secondary | ICD-10-CM

## 2023-11-16 MED ORDER — LISDEXAMFETAMINE DIMESYLATE 70 MG PO CAPS: 70.0000 mg | ORAL_CAPSULE | Freq: Every day | ORAL | 0 refills | Status: DC

## 2023-11-16 MED ORDER — PROMETHAZINE HCL 25 MG RE SUPP: 25.0000 mg | Freq: Four times a day (QID) | RECTAL | 0 refills | Status: AC | PRN

## 2023-11-16 MED ORDER — ONDANSETRON HCL 8 MG PO TABS: 8.0000 mg | ORAL_TABLET | Freq: Every evening | ORAL | 0 refills | Status: AC

## 2023-11-16 NOTE — Progress Notes (Signed)
 Zachary Dixon [id number] [birth year] 58 y.o.   Subjective:   Patient ID:  Zachary Dixon is a 58 y.o. (DOB 06-Jun-1966) male.  Chief Complaint:  Chief Complaint  Patient presents with   Follow-up    Mood, ADD, sleep   Sleeping Problem    Zachary Dixon presents  today for follow-up of Severe TR bipolar depression and rapid cycling with marked impairment in function at work and home.    at visit October 11, 2018.  Cytomel  potentiation was started for depression 25 mcg daily to increase to 50 mcg daily.  In addition there were concerns about flatness and he was wondering if it is related to side effects versus depression.  We reduced the lithium  temporarily to see if that was the cause of the flatness. Reductions in lithium  and depakote  did not help the flatness.  No noticeable effect from the Cytomel  except it did help the thyroid  panel.  At visit Dec 06, 2018.  [name]  was switched to Mydayis to try to improve his function full before waking day. Felt it was less effective than [name] .  Didn't feel it kick in and no longer duration at maximum 50 mg so returned to [name] . It's better.   Patient was  seen August 2020.  Because his TSH was suppressed we reduced Cytomel  to 37.5 mcg daily.  Because of flatness we reduced the Vraylar  to 1.5 mg daily to see if there could be improvement.  Patient seen December 2020.  No med changes were made.  08/28/19 seen with wife, Zachary Dixon Has been under more stress at work for 3 mos.  Not markedly depressed but falls asleep easily in evening and gets tired easily.  Sleeping well with CPAP but may not get to bed early enough.  Falls asleep in chair and then gets to CPAP.  Not enough sleep on nights he has to travel.  Wife concerns.  Doesn't seem happy or interactive.  Used to have good days.  Can be irritable but no anger outbursts.  No mood swings.  No sense of humor which had been there before.  Still anxious and holding it in. We decided to reduce Vraylar  to 1.5  mg every other day to see if flatness was improved.  Mood lability was under control.  As of 10/17/19:  Better.  Lighter and more energy.  Wife agrees.  Not depressed lately.  Not even small downturns.  Very even.   Zachary Dixon notices better engagement.  Anxiety still there with work.  Big projects coming to close in next month.  Everyone at work overwhelmed.  Would love to have another job.  She says he's always been stressed by every job. Plans to look elsewhere.   100% use status of CPAP. Compliant with meds.  No med concerns. Plan: Better with Reduced Vraylar  re: flatness is better.  1.5 mg every other day. No med changes today  01/09/2020 appointment with the following noted: Consistent with meds.  Pretty good without complaints. Still has prominent gag reflex but has had it his whole life but seems worse than in years past.  Makes it even hard to wear CPAP.  Not wearing it 8 hours but 5-6 hours now and 2-3 weeks ago wasn't wearing it.  Feels much better after not wearing it. Getting 8 hours sleep per night. Mood, anxiety and focus steady otherwise. No SE with meds. Plan Rec trial with NAC for cognition and mood.  Disc in detail  06/15/2020 appointment with the following noted: Zachary Dixon dx  breast CA, stage 1.  No chemo planned.  She's dealing with it pretty well. Work remained stressful.  Lost 3 people but trying to keep perspective with wife's health issues.  Still pursuing another job but little time and energy for that. Mood very good overall.  Primary thing is stress and has to be careful with it, otherwise anxiety can spend out of control.  Sleeping well. Endoscopy OK in workup with gagging problem.  Gagging interferes with CPAP use.  Can be worse with stress.  Can be difficult to convince doctor's a problem. PCP Dr. Schuyler Custard is good.  Overreactive gag reflex.  But sometimes can occur out of sleep.  Plan no med changes except again recommend he try NAC for cognitive reasons as noted before.  10/15/2020  appointment with following noted: Zachary Dixon doing well with treatment and finished tx. Still doing therapy.  Some struggle with depression.  Has interests and not as flat as years ago. Really tired and exhausted.  Clearly some related to job and searching. Still deals with gag reflex and sleep mask and sees ENT next week.  Never had problems with claustrophobia but problems with turtle necks, dentist, CPAP now .  Barely wear the CPAP. Doing DBT.  But not following through with all of it. Plan: No med changes Better with Reduced Vraylar  re: flatness is better.  1.5 mg every other day.  02/18/2021 appointment with the following noted: Unsure if covid but had sx pneumonia a few mos ago with negative tests. Lately struggling and maybe bc not using CPAP bc mask phobic and still struggles with gagging even with Covid mask.  Has worked on it.  Has tried meditation to help the gagging. Very tired without [name] .  Function at home affected by some more stress and conflict.  Chronic work stress may get better with changes and still searching for new job without success.  Spent after the work day.  Not present with family.  Zachary Dixon is upset.  Occ periods of despondency.   Couple weekends just walked off for 4 hours and didn't tell wife what he was doing and scared her.  Over did it. Can still enjoy and has interests. P;an: Better with Reduced Vraylar  re: flatness is better.  1.5 mg every other day. No clear indications for med changes  04/29/21 appt noted: Big improvement with CPAP. Energy better. Therapist helped with meditation at night.  Brain-spotting maybe helping with less gagging.  Most nights sleeping with the mask all night.  Pleased gagging better. Same job with a lot of stress.  Overall doing pretty well. Started men's DBT group at [facility]. Pleased with Vraylar  stopped mood swings. No recent mood swings. Except when forgot [name]  for 3 days got depressed and couldn't function.. Patient  reports stable mood and denies depressed or irritable moods.  Patient denies any recent difficulty with anxiety except work stress.  Patient denies difficulty with sleep initiation or maintenance. Denies appetite disturbance.  Patient reports that energy and motivation have been better with CPAP.  Patient denies any difficulty with concentration.  Patient denies any suicidal ideation.  08/05/21 appt noted: Mental health pretty good.  Still a lot of stress at work.  Trying to look for another job as goal.  Several years ago would have been stress and crushed but managing better. Minor mood swings and not depressed.  Tired and work 60 hours per week. No problems known with meds. Doesn't like CPAP DT gagging which is better and wants Inspire.  Zachary Dixon doing well with breast CA. No problems with the meds. Sleep is OK except hard to get enough.  12/08/21 appt noted: Last few weeks a lot of stress and not managing well.  Zachary Dixon hosp with infection.  Lost her job.  $ stress. Usual work stress too. Kind of depression this weekend and not out of control.  Getting better.  Mood changes usually a few hours. CPAP mask broke and delay getting it.  Sleeep a problem for a couple of weeks. Zachary Dixon had Covid a couple of times and been worn down this year.   Son 73 yo and struggling emotionally. Still good interests and enjoyment.  03/10/22 appt noted:  Disc shortage of [name]  but on 70 mg daily. Doing well.  With mood. Son off to college and going well.   Major work deadline met in August and less demand so time to look for new job now.  Was 2 year project. Satisfied with meds. Sleep not too bad. Wants to look into Inspire but is using CPAP. Less anxious. Improving exercise. Plan: Better with Reduced Vraylar  re: flatness is better.  1.5 mg every other day. No clear indications for med changes.  No reduction until less stressful job. Continue [name]   70 mg, Depakote  ER 1000, Cytomel  37.5, lithium  600, Trazodone    hs PE with Dr. Schuyler Custard November 2022 for labs. Check lithium  level, TSH, D  07/07/22 appt noted: Meds as noted above and compliant. Had PE recently with Dr. Schuyler Custard. Has been satisfied with [name] . Mild seasonal depression and not too bad.   Still at same job and still looking for antoher job.  It's not been as bad in last 6 mos. Sleep so so and not great.  Saw ENT about Inspire in [place] but awaitng consult in March.  It's a process.  Trying to use CPAP but it's not great.  Acutely aware of when he does not sleep well.  Will be getting antoher sleep study. No major mood swings. No SE concerns.  Is having more nocturia than normal.   Vraylar  has done wonders for mood. Still excessive gag reflex interferes with CPAP Zachary Dixon slowly recovering from sepsis and starting work again. Plan no changes  03/02/23 appt  noted: Meds: Vraylar  1.5 mg every other day, [name]   70 mg, Depakote  ER 1000, Cytomel  37.5, lithium  600, Trazodone   hs No SE. Not bad overall.  Only issue is gagging affecting sleep.  Exttreme gag reflex all life but since Covid it is worse.   Ongoing.  Couldn't get Inspire bc apnea not bad enough.  Most nights not CPAP all night but 4-6 hours.   Trying to generate wt loss to see if it is better.   No major mood swings.  No trouble going to sleep.  7 hours or more of sleep.   Plan: No clear indications for med changes  11/16/23 appt noted: Meds: Vraylar  1.5 mg every other day, [name]   70 mg, Depakote  ER 1000, Cytomel  37.5, lithium  600, Trazodone   hs prn No SE. Pretty good overall .  But still challenging with sleep px and gagging.  Saw dentist.   Most gagging at night and CPAP compliance affected .   Therapist tried brain spotting  Fatigue and sleepiness varies 50%.  Sleep with waking with gaggying More exercise and losing some wt.     Past psychiatric med medication trials include failure of ECT and TMS,  Latuda 120 mg,  aripiprazole, olanzapine 20 mg, Symbyax Seroquel ,  Rexulti, Vraylar   3 pramipexole ,  lamotrigine, , Depakote  1500 ataxia,  lithium  at high blood levels,  Pristiq, sertraline, bupropion 450 mg caused anxiety, Lexapro, Viibryd,  Deplin, Duloxetine, Trintellix, [name]  70, Mydayis Concerta 72 mg, Adderall 70% effect of [name]  History of some DBT Clonazepam  for gagging NR  Review of Systems:  Review of Systems  Constitutional:  Positive for fatigue.  HENT:  Negative for sore throat.        Still gagging  Gastrointestinal:  Negative for nausea.       Gagging comes and goes ongoing  Genitourinary:  Positive for frequency.  Neurological:  Negative for tremors.  Psychiatric/Behavioral:  Negative for agitation, behavioral problems, confusion, decreased concentration, dysphoric mood, hallucinations, self-injury, sleep disturbance and suicidal ideas. The patient is not nervous/anxious and is not hyperactive.     Medications: I have reviewed the patient's current medications.  Current Outpatient Medications  Medication Sig Dispense Refill   Albuterol-Budesonide (AIRSUPRA ) 90-80 MCG/ACT AERO Inhale 2 Inhalations into the lungs every 6 (six) hours as needed. 10.7 g 0   cariprazine  (VRAYLAR ) 1.5 MG capsule Take 1 capsule (1.5 mg total) by mouth daily. 30 capsule 5   clonazePAM  (KLONOPIN ) 0.5 MG tablet 1-2 tablets at night for sleep 60 tablet 0   cyanocobalamin  (VITAMIN B12) 1000 MCG tablet Take by mouth.     divalproex  (DEPAKOTE  ER) 500 MG 24 hr tablet Take 2 tablets (1,000 mg total) by mouth at bedtime. 180 tablet 1   Folic Acid  (FOLATE PO)      L-methylfolate Calcium  15 MG TABS TAKE 1 TABLET BY MOUTH EVERY DAY 30 tablet 11   liothyronine  (CYTOMEL ) 25 MCG tablet TAKE 1 AND 1/2 TABLETS BY MOUTH EVERY MORNING 45 tablet 1   lisdexamfetamine ([name] ) 70 MG capsule Take 1 capsule (70 mg total) by mouth daily. 30 capsule 0   lisdexamfetamine ([name] ) 70 MG capsule Take 1 capsule (70 mg total) by mouth daily. 30 capsule 0   lithium  carbonate  (LITHOBID ) 300 MG ER tablet Take 2 tablets (600 mg total) by mouth at bedtime. 180 tablet 1   Omega-3 1000 MG CAPS Take by mouth.     ondansetron (ZOFRAN) 8 MG tablet Take 1 tablet (8 mg total) by mouth at bedtime. 20 tablet 0   promethazine (PHENERGAN) 25 MG suppository Place 1 suppository (25 mg total) rectally every 6 (six) hours as needed for nausea or vomiting. 25 each 0   rosuvastatin (CRESTOR) 10 MG tablet Take 10 mg by mouth daily.     traZODone  (DESYREL ) 50 MG tablet TAKE 1 TO 3 TABLETS BY MOUTH AT BEDTIME     Vitamin D , Ergocalciferol , (DRISDOL ) 1.25 MG (50000 UNIT) CAPS capsule TAKE 1 CAPSULE (50,000 UNITS TOTAL) BY MOUTH EVERY 7 (SEVEN) DAYS 13 capsule 0   [name]  70 MG capsule Take 1 capsule (70 mg total) by mouth daily. 30 capsule 0   [name]  70 MG capsule Take 1 capsule (70 mg total) by mouth daily. 30 capsule 0   lisdexamfetamine ([name] ) 70 MG capsule Take 1 capsule (70 mg total) by mouth daily. 30 capsule 0   No current facility-administered medications for this visit.    Medication Side Effects: None  Tremor not gone but less  Allergies: No Known Allergies  Past Medical History:  Diagnosis Date   ADHD    Allergy    Anxiety    Bipolar 2 disorder (HCC)    Depression    Diabetes mellitus without complication (HCC)    GERD (gastroesophageal reflux disease)  Hyperlipidemia    diet controlled   Sleep apnea    uses CPAP nightly   Thyroid  disease     Family History  Problem Relation Age of Onset   Asthma Father    Emphysema Father    Colon cancer Maternal Aunt    Breast cancer Maternal Grandmother    Colon cancer Maternal Grandfather    Heart disease Paternal Grandmother    Heart disease Paternal Grandfather    Rectal cancer Neg Hx    Stomach cancer Neg Hx    Esophageal cancer Neg Hx     Social History   Socioeconomic History   Marital status: Married    Spouse name: Not on file   Number of children: 1   Years of education: Not on file   Highest  education level: Not on file  Occupational History   Occupation: marketing  Tobacco Use   Smoking status: Former    Current packs/day: 0.00    Average packs/day: 1 pack/day for 7.0 years (7.0 ttl pk-yrs)    Types: Cigarettes    Start date: 07/11/1985    Quit date: 07/11/1992    Years since quitting: 31.3   Smokeless tobacco: Never  Vaping Use   Vaping status: Never Used  Substance and Sexual Activity   Alcohol use: Yes    Alcohol/week: 4.0 standard drinks of alcohol    Types: 4 Standard drinks or equivalent per week   Drug use: No   Sexual activity: Not Currently  Other Topics Concern   Not on file  Social History Narrative   Not on file   Social Drivers of Health   Financial Resource Strain: Not on file  Food Insecurity: Not on file  Transportation Needs: Not on file  Physical Activity: Not on file  Stress: Not on file  Social Connections: Not on file  Intimate Partner Violence: Not on file    Past Medical History, Surgical history, Social history, and Family history were reviewed and updated as appropriate.   Son Zachary Dixon.  Please see review of systems for further details on the patient's review from today.   Objective:   Physical Exam:  There were no vitals taken for this visit.  Physical Exam Constitutional:      General: He is not in acute distress.    Appearance: Normal appearance.  Musculoskeletal:        General: No deformity.  Neurological:     Mental Status: He is alert and oriented to person, place, and time.     Cranial Nerves: No dysarthria.     Coordination: Coordination normal.  Psychiatric:        Attention and Perception: Attention and perception normal. He does not perceive auditory hallucinations.        Mood and Affect: Mood is not anxious or depressed. Affect is not labile, blunt, angry or inappropriate.        Speech: Speech normal.        Behavior: Behavior normal. Behavior is cooperative.        Thought Content: Thought content normal.  Thought content is not paranoid or delusional. Thought content does not include homicidal or suicidal ideation. Thought content does not include suicidal plan.        Cognition and Memory: Cognition and memory normal.        Judgment: Judgment normal.     Comments: Insight intact      Lab Review:     Component Value Date/Time   NA 141 07/08/2022  0932   K 4.3 07/08/2022 0932   CL 107 07/08/2022 0932   CO2 26 07/08/2022 0932   GLUCOSE 106 (H) 07/08/2022 0932   BUN 21 07/08/2022 0932   CREATININE 1.15 07/08/2022 0932   CALCIUM  9.3 07/08/2022 0932   PROT 6.7 08/26/2016 1959   ALBUMIN 3.8 08/26/2016 1959   AST 18 08/26/2016 1959   ALT 21 08/26/2016 1959   ALKPHOS 60 08/26/2016 1959   BILITOT 0.6 08/26/2016 1959   GFRNONAA >60 08/26/2016 1959   GFRAA >60 08/26/2016 1959       Component Value Date/Time   WBC 8.6 08/26/2016 1959   RBC 4.35 08/26/2016 1959   HGB 13.7 08/26/2016 1959   HCT 39.8 08/26/2016 1959   PLT 221 08/26/2016 1959   MCV 91.5 08/26/2016 1959   MCH 31.5 08/26/2016 1959   MCHC 34.4 08/26/2016 1959   RDW 12.6 08/26/2016 1959   LYMPHSABS 2.4 08/26/2016 1959   MONOABS 0.4 08/26/2016 1959   EOSABS 0.2 08/26/2016 1959   BASOSABS 0.0 08/26/2016 1959    Lithium  Lvl  Date Value Ref Range Status  07/08/2022 <0.3 (L) 0.6 - 1.2 mmol/L Final     Lab Results  Component Value Date   VALPROATE 54.6 04/02/2018    TSH at 3   Normal ammonia 47 in 2019.  .res Assessment: Plan:    Bipolar I disorder with depression (HCC)  Generalized anxiety disorder  Obstructive sleep apnea  Attention deficit hyperactivity disorder (ADHD), combined type - Plan: lisdexamfetamine ([name] ) 70 MG capsule  Insomnia due to mental condition  Restless legs syndrome  Low vitamin D  level  Lithium  use  Episode of gagging - Plan: ondansetron (ZOFRAN) 8 MG tablet, promethazine (PHENERGAN) 25 MG suppository   40 min of face to face time with patient.  We discussed his  treatment resistant bipolar depression which is associated with a lot of mixed symptoms of irritability and irregular sleep pattern.  His symptoms have been severe and very resistant to any benefit.    He cannot tolerate an increase in the Depakote  based on prior ataxic experiences at 1500 mg daily.  Higher doses of lithium  have not helped anymore.  He has failed multiple medications and ECT and TMS. HowEver the flatness is much improved with the reduction in in Vraylar  to 1.5 mg every other day.  He has had no worsening anxiety, mood swings nor depression.  He was not having extreme cycling like he did in the past.  We previously had reduced lithium  to 600 mg daily and reduce Depakote  to 500 mg daily so those were not changed. Overall his mood is worse a bit and may be due to reduced use of CPAP bc gagging.  He has been less and less compliant with his CPAP lately due to gagging problem  . RLS managed.  Pramipexole  prn for RLS this is managed at this time.  Continue vitamin D  check level later.  Continue B12. Extensive discussions about possible supplements that could be helpful.  He will continue the following. B-Complex 150 Still rec N-Acetylcysteine at 1000-1200 mg daily to help with mild cognitive problems and depression.  It can be combined with a B-complex vitamin as the B-12 and folate have been shown to sometimes enhance the effect. L-methylfolate 15 mg daily for depression.  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  Discussed his polypharmacy which is not ideal but has been necessary to control the mood cycling and now his  depression has improved.  Plus he also has ADD that has to be managed.  He is satisfied with his ADD management with [name] .  No clear indications for med changes.  No reduction until less stressful job. Better with Reduced Vraylar  re: flatness is better.  1.5 mg every other day.  Continue [name]   70 mg, Depakote  ER 1000, Cytomel  37.5,  lithium  600,  nocturia better  Trial Zofran 8 mg  for gaggin and if fails then phenergan.   Disc CBT details around gagging with behavioral physical tools to try to adapt like chin strap  etc.  PE with Dr. Schuyler Custard December 2024 for labs. Checked lithium  level, TSH, D emphasized.    FU 3 months.    Nori Beat, MD, DFAPA   Please see After Visit Summary for patient specific instructions.  No future appointments.    No orders of the defined types were placed in this encounter.      -------------------------------

## 2023-12-09 ENCOUNTER — Other Ambulatory Visit: Payer: Self-pay | Admitting: Psychiatry

## 2023-12-09 DIAGNOSIS — F319 Bipolar disorder, unspecified: Secondary | ICD-10-CM

## 2023-12-11 ENCOUNTER — Other Ambulatory Visit: Payer: Self-pay | Admitting: Psychiatry

## 2023-12-11 DIAGNOSIS — F319 Bipolar disorder, unspecified: Secondary | ICD-10-CM

## 2023-12-11 NOTE — Telephone Encounter (Signed)
 PA approved 10/2022. Prior Authorization initiated with [email or web address] EOC# [id number] Member ID [id number]

## 2023-12-14 ENCOUNTER — Telehealth: Payer: Self-pay | Admitting: Psychiatry

## 2023-12-14 ENCOUNTER — Other Ambulatory Visit: Payer: Self-pay

## 2023-12-14 DIAGNOSIS — F902 Attention-deficit hyperactivity disorder, combined type: Secondary | ICD-10-CM

## 2023-12-14 MED ORDER — LISDEXAMFETAMINE DIMESYLATE 70 MG PO CAPS
70.0000 mg | ORAL_CAPSULE | Freq: Every day | ORAL | 0 refills | Status: DC
Start: 2023-12-14 — End: 2024-02-15

## 2023-12-14 NOTE — Telephone Encounter (Signed)
 Pended [name]  70 to [facility] on [address].

## 2023-12-14 NOTE — Telephone Encounter (Signed)
 Pt called at 2:23 stating pharmacy told Zachary Dixon there might be a problem with the insurance pay for the Vraylar .  Pls call Zachary Dixon back to discuss.  (I didn't see this note about the PA until now).  Confirmed pharmacy is [facility] [address].  Next appt 8/7

## 2023-12-14 NOTE — Telephone Encounter (Signed)
 Pt requested a refill on his generic vyvanse  70 mg. He would like it sent to the [facility] [address]

## 2023-12-15 ENCOUNTER — Other Ambulatory Visit: Payer: Self-pay

## 2023-12-15 ENCOUNTER — Telehealth: Payer: Self-pay | Admitting: Psychiatry

## 2023-12-15 DIAGNOSIS — F319 Bipolar disorder, unspecified: Secondary | ICD-10-CM

## 2023-12-15 MED ORDER — CARIPRAZINE HCL 1.5 MG PO CAPS: 1.5000 mg | ORAL_CAPSULE | Freq: Every day | ORAL | 5 refills | Status: DC

## 2023-12-15 NOTE — Telephone Encounter (Signed)
 See other note, PA pending with [employer]

## 2023-12-15 NOTE — Telephone Encounter (Signed)
 Prior authorization initiated with [employer] for Vraylar  1.5 mg EOC [id number], pending response  [id number]

## 2023-12-15 NOTE — Telephone Encounter (Signed)
 Pt needs a new script  of vraylar  1.5 mg. Pharmacy is [facility] [address]. The pharmacy told him he would need a PA. I told him he could come and get samples until we get it approved. He said that he has been out since Monday and is starting to feel side effects of being off of it

## 2023-12-18 NOTE — Telephone Encounter (Signed)
 Received a fax via [employer] requesting all information submitted to now be faxed with a completed form and office notes instead of via website. Faxed everything requested for urgent review.

## 2023-12-19 NOTE — Telephone Encounter (Signed)
 Prior approval received effective 12/18/23-12/16/24 for Vraylar  1.5 mg with [employer]

## 2024-01-09 ENCOUNTER — Other Ambulatory Visit: Payer: Self-pay | Admitting: Psychiatry

## 2024-01-09 DIAGNOSIS — R7989 Other specified abnormal findings of blood chemistry: Secondary | ICD-10-CM

## 2024-01-11 ENCOUNTER — Other Ambulatory Visit: Payer: Self-pay

## 2024-01-11 ENCOUNTER — Telehealth: Payer: Self-pay | Admitting: Psychiatry

## 2024-01-11 DIAGNOSIS — F902 Attention-deficit hyperactivity disorder, combined type: Secondary | ICD-10-CM

## 2024-01-11 MED ORDER — LISDEXAMFETAMINE DIMESYLATE 70 MG PO CAPS
70.0000 mg | ORAL_CAPSULE | Freq: Every day | ORAL | 0 refills | Status: DC
Start: 2024-01-11 — End: 2024-02-15

## 2024-01-11 NOTE — Telephone Encounter (Signed)
Pended printed Rx.

## 2024-01-11 NOTE — Telephone Encounter (Signed)
 Pt called at 11:25 requesting a paper script of generic [name] .  He said some pharmacies seem to have a problem with having it in stock, so he prefers a paper script.  Pls call him when it's ready to be picked up.   Next appt 8/7

## 2024-01-15 ENCOUNTER — Other Ambulatory Visit: Payer: Self-pay

## 2024-01-15 DIAGNOSIS — F902 Attention-deficit hyperactivity disorder, combined type: Secondary | ICD-10-CM

## 2024-01-15 MED ORDER — LISDEXAMFETAMINE DIMESYLATE 70 MG PO CAPS: 70.0000 mg | ORAL_CAPSULE | Freq: Every day | ORAL | 0 refills | Status: DC

## 2024-02-07 ENCOUNTER — Telehealth: Payer: Self-pay | Admitting: Psychiatry

## 2024-02-07 NOTE — Telephone Encounter (Signed)
 Pt reports he would like to talk with you about a private matter. He said it wasn't urgent, but didn't want to reveal anything to me.

## 2024-02-07 NOTE — Telephone Encounter (Signed)
 Pt LVM @ 4:37p stating he has a question for Dr Geoffry.  Pls call him back.  Next appt 8/7

## 2024-02-09 NOTE — Telephone Encounter (Signed)
Returned call. LM

## 2024-02-15 ENCOUNTER — Encounter: Payer: Self-pay | Admitting: Psychiatry

## 2024-02-15 ENCOUNTER — Ambulatory Visit (INDEPENDENT_AMBULATORY_CARE_PROVIDER_SITE_OTHER): Admitting: Psychiatry

## 2024-02-15 DIAGNOSIS — F319 Bipolar disorder, unspecified: Secondary | ICD-10-CM | POA: Diagnosis not present

## 2024-02-15 DIAGNOSIS — F411 Generalized anxiety disorder: Secondary | ICD-10-CM

## 2024-02-15 DIAGNOSIS — F902 Attention-deficit hyperactivity disorder, combined type: Secondary | ICD-10-CM | POA: Diagnosis not present

## 2024-02-15 DIAGNOSIS — F5105 Insomnia due to other mental disorder: Secondary | ICD-10-CM

## 2024-02-15 DIAGNOSIS — G4733 Obstructive sleep apnea (adult) (pediatric): Secondary | ICD-10-CM | POA: Diagnosis not present

## 2024-02-15 DIAGNOSIS — G2581 Restless legs syndrome: Secondary | ICD-10-CM

## 2024-02-15 DIAGNOSIS — R7989 Other specified abnormal findings of blood chemistry: Secondary | ICD-10-CM

## 2024-02-15 MED ORDER — LISDEXAMFETAMINE DIMESYLATE 70 MG PO CAPS: 70.0000 mg | ORAL_CAPSULE | Freq: Every day | ORAL | 0 refills | Status: DC

## 2024-02-15 NOTE — Progress Notes (Signed)
 Zachary Dixon [id number] Feb 25, 1966 58 y.o.   Subjective:   Patient ID:  Zachary Dixon is a 58 y.o. (DOB 1966-05-17) male.  Chief Complaint:  Chief Complaint  Patient presents with   Follow-up   Depression   Anxiety    Zachary Dixon presents  today for follow-up of Severe TR bipolar depression and rapid cycling with marked impairment in function at work and home.    at visit October 11, 2018.  Cytomel  potentiation was started for depression 25 mcg daily to increase to 50 mcg daily.  In addition there were concerns about flatness and he was wondering if it is related to side effects versus depression.  We reduced the lithium  temporarily to see if that was the cause of the flatness. Reductions in lithium  and depakote  did not help the flatness.  No noticeable effect from the Cytomel  except it did help the thyroid  panel.  At visit Dec 06, 2018.  [name]  was switched to Mydayis to try to improve his function full before waking day. Felt it was less effective than [name] .  Didn't feel it kick in and no longer duration at maximum 50 mg so returned to [name] . It's better.   Patient was  seen August 2020.  Because his TSH was suppressed we reduced Cytomel  to 37.5 mcg daily.  Because of flatness we reduced the Vraylar  to 1.5 mg daily to see if there could be improvement.  Patient seen December 2020.  No med changes were made.  08/28/19 seen with wife, Sherrell Has been under more stress at work for 3 mos.  Not markedly depressed but falls asleep easily in evening and gets tired easily.  Sleeping well with CPAP but may not get to bed early enough.  Falls asleep in chair and then gets to CPAP.  Not enough sleep on nights he has to travel.  Wife concerns.  Doesn't seem happy or interactive.  Used to have good days.  Can be irritable but no anger outbursts.  No mood swings.  No sense of humor which had been there before.  Still anxious and holding it in. We decided to reduce Vraylar  to 1.5 mg every other  day to see if flatness was improved.  Mood lability was under control.  As of 10/17/19:  Better.  Lighter and more energy.  Wife agrees.  Not depressed lately.  Not even small downturns.  Very even.   Zachary Dixon notices better engagement.  Anxiety still there with work.  Big projects coming to close in next month.  Everyone at work overwhelmed.  Would love to have another job.  She says he's always been stressed by every job. Plans to look elsewhere.   100% use status of CPAP. Compliant with meds.  No med concerns. Plan: Better with Reduced Vraylar  re: flatness is better.  1.5 mg every other day. No med changes today  01/09/2020 appointment with the following noted: Consistent with meds.  Pretty good without complaints. Still has prominent gag reflex but has had it his whole life but seems worse than in years past.  Makes it even hard to wear CPAP.  Not wearing it 8 hours but 5-6 hours now and 2-3 weeks ago wasn't wearing it.  Feels much better after not wearing it. Getting 8 hours sleep per night. Mood, anxiety and focus steady otherwise. No SE with meds. Plan Rec trial with NAC for cognition and mood.  Disc in detail  06/15/2020 appointment with the following noted: Nan dx breast CA, stage 1.  No chemo planned.  She's dealing with it pretty well. Work remained stressful.  Lost 3 people but trying to keep perspective with wife's health issues.  Still pursuing another job but little time and energy for that. Mood very good overall.  Primary thing is stress and has to be careful with it, otherwise anxiety can spend out of control.  Sleeping well. Endoscopy OK in workup with gagging problem.  Gagging interferes with CPAP use.  Can be worse with stress.  Can be difficult to convince doctor's a problem. PCP Dr. Clarice is good.  Overreactive gag reflex.  But sometimes can occur out of sleep.  Plan no med changes except again recommend he try NAC for cognitive reasons as noted before.  10/15/2020 appointment with  following noted: Zachary Dixon doing well with treatment and finished tx. Still doing therapy.  Some struggle with depression.  Has interests and not as flat as years ago. Really tired and exhausted.  Clearly some related to job and searching. Still deals with gag reflex and sleep mask and sees ENT next week.  Never had problems with claustrophobia but problems with turtle necks, dentist, CPAP now .  Barely wear the CPAP. Doing DBT.  But not following through with all of it. Plan: No med changes Better with Reduced Vraylar  re: flatness is better.  1.5 mg every other day.  02/18/2021 appointment with the following noted: Unsure if covid but had sx pneumonia a few mos ago with negative tests. Lately struggling and maybe bc not using CPAP bc mask phobic and still struggles with gagging even with Covid mask.  Has worked on it.  Has tried meditation to help the gagging. Very tired without [name] .  Function at home affected by some more stress and conflict.  Chronic work stress may get better with changes and still searching for new job without success.  Spent after the work day.  Not present with family.  Zachary Dixon is upset.  Occ periods of despondency.   Couple weekends just walked off for 4 hours and didn't tell wife what he was doing and scared her.  Over did it. Can still enjoy and has interests. P;an: Better with Reduced Vraylar  re: flatness is better.  1.5 mg every other day. No clear indications for med changes  04/29/21 appt noted: Big improvement with CPAP. Energy better. Therapist helped with meditation at night.  Brain-spotting maybe helping with less gagging.  Most nights sleeping with the mask all night.  Pleased gagging better. Same job with a lot of stress.  Overall doing pretty well. Started men's DBT group at [facility] counseling. Pleased with Vraylar  stopped mood swings. No recent mood swings. Except when forgot [name]  for 3 days got depressed and couldn't function.. Patient reports stable mood  and denies depressed or irritable moods.  Patient denies any recent difficulty with anxiety except work stress.  Patient denies difficulty with sleep initiation or maintenance. Denies appetite disturbance.  Patient reports that energy and motivation have been better with CPAP.  Patient denies any difficulty with concentration.  Patient denies any suicidal ideation.  08/05/21 appt noted: Mental health pretty good.  Still a lot of stress at work.  Trying to look for another job as goal.  Several years ago would have been stress and crushed but managing better. Minor mood swings and not depressed.  Tired and work 60 hours per week. No problems known with meds. Doesn't like CPAP DT gagging which is better and wants Inspire. Inocente doing well with breast  CA. No problems with the meds. Sleep is OK except hard to get enough.  12/08/21 appt noted: Last few weeks a lot of stress and not managing well.  Nan hosp with infection.  Lost her job.  $ stress. Usual work stress too. Kind of depression this weekend and not out of control.  Getting better.  Mood changes usually a few hours. CPAP mask broke and delay getting it.  Sleeep a problem for a couple of weeks. Nan had Covid a couple of times and been worn down this year.   Son 8 yo and struggling emotionally. Still good interests and enjoyment.  03/10/22 appt noted:  Disc shortage of [name]  but on 70 mg daily. Doing well.  With mood. Son off to college and going well.   Major work deadline met in August and less demand so time to look for new job now.  Was 2 year project. Satisfied with meds. Sleep not too bad. Wants to look into Inspire but is using CPAP. Less anxious. Improving exercise. Plan: Better with Reduced Vraylar  re: flatness is better.  1.5 mg every other day. No clear indications for med changes.  No reduction until less stressful job. Continue [name]   70 mg, Depakote  ER 1000, Cytomel  37.5, lithium  600, Trazodone   hs PE with Dr. Clarice  November 2022 for labs. Check lithium  level, TSH, D  07/07/22 appt noted: Meds as noted above and compliant. Had PE recently with Dr. Clarice. Has been satisfied with [name] . Mild seasonal depression and not too bad.   Still at same job and still looking for antoher job.  It's not been as bad in last 6 mos. Sleep so so and not great.  Saw ENT about Inspire in [place] but awaitng consult in March.  It's a process.  Trying to use CPAP but it's not great.  Acutely aware of when he does not sleep well.  Will be getting antoher sleep study. No major mood swings. No SE concerns.  Is having more nocturia than normal.   Vraylar  has done wonders for mood. Still excessive gag reflex interferes with CPAP Nan slowly recovering from sepsis and starting work again. Plan no changes  03/02/23 appt  noted: Meds: Vraylar  1.5 mg every other day, [name]   70 mg, Depakote  ER 1000, Cytomel  37.5, lithium  600, Trazodone   hs No SE. Not bad overall.  Only issue is gagging affecting sleep.  Exttreme gag reflex all life but since Covid it is worse.   Ongoing.  Couldn't get Inspire bc apnea not bad enough.  Most nights not CPAP all night but 4-6 hours.   Trying to generate wt loss to see if it is better.   No major mood swings.  No trouble going to sleep.  7 hours or more of sleep.   Plan: No clear indications for med changes  11/16/23 appt noted: Meds: Vraylar  1.5 mg every other day, [name]   70 mg, Depakote  ER 1000, Cytomel  37.5, lithium  600, Trazodone   hs prn No SE. Pretty good overall .  But still challenging with sleep px and gagging.  Saw dentist.   Most gagging at night and CPAP compliance affected .   Therapist tried brain spotting  Fatigue and sleepiness varies 50%.  Sleep with waking with gaggying More exercise and losing some wt.    02/15/24 appt noted:  Med: Vraylar  1.5 mg every other day, [name]   70 mg, Depakote  ER 1000, Cytomel  37.5, lithium  600, Trazodone   hs prn No SE. Overall doing really  well.   Still chronic gagging.  Zofran  didn't help.   Patient reports stable mood and denies depressed or irritable moods.  Patient denies any recent difficulty with anxiety.  Patient denies difficulty with sleep initiation or maintenance. Denies appetite disturbance.  Patient reports that energy and motivation have been good.  Patient denies any difficulty with concentration.  Patient denies any suicidal ideation.   Past psychiatric med medication trials include failure of ECT and TMS,  Latuda 120 mg,  aripiprazole, olanzapine 20 mg, Symbyax Seroquel , Rexulti, Vraylar  3 pramipexole ,  lamotrigine, , Depakote  1500 ataxia,  lithium  at high blood levels,  Pristiq, sertraline, bupropion 450 mg caused anxiety, Lexapro, Viibryd,  Deplin, Duloxetine, Trintellix, [name]  70, Mydayis Concerta 72 mg, Adderall 70% effect of [name]  History of some DBT Clonazepam  for gagging NR  Review of Systems:  Review of Systems  HENT:  Negative for sore throat.        Still gagging  Gastrointestinal:  Negative for nausea.       Gagging comes and goes ongoing  Neurological:  Negative for tremors.  Psychiatric/Behavioral:  Negative for agitation, behavioral problems, confusion, decreased concentration, dysphoric mood, hallucinations, self-injury, sleep disturbance and suicidal ideas. The patient is not nervous/anxious and is not hyperactive.     Medications: I have reviewed the patient's current medications.  Current Outpatient Medications  Medication Sig Dispense Refill   Albuterol-Budesonide (AIRSUPRA ) 90-80 MCG/ACT AERO Inhale 2 Inhalations into the lungs every 6 (six) hours as needed. 10.7 g 0   cariprazine  (VRAYLAR ) 1.5 MG capsule Take 1 capsule (1.5 mg total) by mouth daily. 30 capsule 5   clonazePAM  (KLONOPIN ) 0.5 MG tablet 1-2 tablets at night for sleep 60 tablet 0   cyanocobalamin  (VITAMIN B12) 1000 MCG tablet Take by mouth.     divalproex  (DEPAKOTE  ER) 500 MG 24 hr tablet Take 2 tablets (1,000 mg  total) by mouth at bedtime. 180 tablet 1   Folic Acid  (FOLATE PO)      L-methylfolate Calcium  15 MG TABS TAKE 1 TABLET BY MOUTH EVERY DAY 30 tablet 11   liothyronine  (CYTOMEL ) 25 MCG tablet TAKE 1 AND 1/2 TABLETS BY MOUTH EVERY MORNING 45 tablet 2   lithium  carbonate (LITHOBID ) 300 MG ER tablet Take 2 tablets (600 mg total) by mouth at bedtime. 180 tablet 1   Omega-3 1000 MG CAPS Take by mouth.     ondansetron  (ZOFRAN ) 8 MG tablet Take 1 tablet (8 mg total) by mouth at bedtime. 20 tablet 0   promethazine  (PHENERGAN ) 25 MG suppository Place 1 suppository (25 mg total) rectally every 6 (six) hours as needed for nausea or vomiting. 25 each 0   rosuvastatin (CRESTOR) 10 MG tablet Take 10 mg by mouth daily.     traZODone  (DESYREL ) 50 MG tablet TAKE 1 TO 3 TABLETS BY MOUTH AT BEDTIME     Vitamin D , Ergocalciferol , (DRISDOL ) 1.25 MG (50000 UNIT) CAPS capsule TAKE 1 CAPSULE (50,000 UNITS TOTAL) BY MOUTH EVERY 7 (SEVEN) DAYS 13 capsule 0   [START ON 04/11/2024] lisdexamfetamine ([name] ) 70 MG capsule Take 1 capsule (70 mg total) by mouth daily. 30 capsule 0   [START ON 03/14/2024] lisdexamfetamine ([name] ) 70 MG capsule Take 1 capsule (70 mg total) by mouth daily. 30 capsule 0   lisdexamfetamine ([name] ) 70 MG capsule Take 1 capsule (70 mg total) by mouth daily. 30 capsule 0   No current facility-administered medications for this visit.    Medication Side Effects: None  Tremor not gone but less  Allergies: No Known Allergies  Past Medical History:  Diagnosis Date   ADHD    Allergy    Anxiety    Bipolar 2 disorder (HCC)    Depression    Diabetes mellitus without complication (HCC)    GERD (gastroesophageal reflux disease)    Hyperlipidemia    diet controlled   Sleep apnea    uses CPAP nightly   Thyroid  disease     Family History  Problem Relation Age of Onset   Asthma Father    Emphysema Father    Colon cancer Maternal Aunt    Breast cancer Maternal Grandmother    Colon cancer  Maternal Grandfather    Heart disease Paternal Grandmother    Heart disease Paternal Grandfather    Rectal cancer Neg Hx    Stomach cancer Neg Hx    Esophageal cancer Neg Hx     Social History   Socioeconomic History   Marital status: Married    Spouse name: Not on file   Number of children: 1   Years of education: Not on file   Highest education level: Not on file  Occupational History   Occupation: marketing  Tobacco Use   Smoking status: Former    Current packs/day: 0.00    Average packs/day: 1 pack/day for 7.0 years (7.0 ttl pk-yrs)    Types: Cigarettes    Start date: 07/11/1985    Quit date: 07/11/1992    Years since quitting: 31.6   Smokeless tobacco: Never  Vaping Use   Vaping status: Never Used  Substance and Sexual Activity   Alcohol use: Yes    Alcohol/week: 4.0 standard drinks of alcohol    Types: 4 Standard drinks or equivalent per week   Drug use: No   Sexual activity: Not Currently  Other Topics Concern   Not on file  Social History Narrative   Not on file   Social Drivers of Health   Financial Resource Strain: Not on file  Food Insecurity: Not on file  Transportation Needs: Not on file  Physical Activity: Not on file  Stress: Not on file  Social Connections: Not on file  Intimate Partner Violence: Not on file    Past Medical History, Surgical history, Social history, and Family history were reviewed and updated as appropriate.   Son sam.  Please see review of systems for further details on the patient's review from today.   Objective:   Physical Exam:  There were no vitals taken for this visit.  Physical Exam Constitutional:      General: He is not in acute distress.    Appearance: Normal appearance.  Musculoskeletal:        General: No deformity.  Neurological:     Mental Status: He is alert and oriented to person, place, and time.     Cranial Nerves: No dysarthria.     Coordination: Coordination normal.  Psychiatric:         Attention and Perception: Attention and perception normal. He does not perceive auditory hallucinations.        Mood and Affect: Mood is not anxious or depressed. Affect is not labile, blunt, angry or inappropriate.        Speech: Speech normal.        Behavior: Behavior normal. Behavior is cooperative.        Thought Content: Thought content normal. Thought content is not paranoid or delusional. Thought content does not include homicidal or suicidal ideation. Thought content does not include suicidal  plan.        Cognition and Memory: Cognition and memory normal.        Judgment: Judgment normal.     Comments: Insight intact Sx well controlled generally     Lab Review:     Component Value Date/Time   NA 141 07/08/2022 0932   K 4.3 07/08/2022 0932   CL 107 07/08/2022 0932   CO2 26 07/08/2022 0932   GLUCOSE 106 (H) 07/08/2022 0932   BUN 21 07/08/2022 0932   CREATININE 1.15 07/08/2022 0932   CALCIUM  9.3 07/08/2022 0932   PROT 6.7 08/26/2016 1959   ALBUMIN 3.8 08/26/2016 1959   AST 18 08/26/2016 1959   ALT 21 08/26/2016 1959   ALKPHOS 60 08/26/2016 1959   BILITOT 0.6 08/26/2016 1959   GFRNONAA >60 08/26/2016 1959   GFRAA >60 08/26/2016 1959       Component Value Date/Time   WBC 8.6 08/26/2016 1959   RBC 4.35 08/26/2016 1959   HGB 13.7 08/26/2016 1959   HCT 39.8 08/26/2016 1959   PLT 221 08/26/2016 1959   MCV 91.5 08/26/2016 1959   MCH 31.5 08/26/2016 1959   MCHC 34.4 08/26/2016 1959   RDW 12.6 08/26/2016 1959   LYMPHSABS 2.4 08/26/2016 1959   MONOABS 0.4 08/26/2016 1959   EOSABS 0.2 08/26/2016 1959   BASOSABS 0.0 08/26/2016 1959    Lithium  Lvl  Date Value Ref Range Status  07/08/2022 <0.3 (L) 0.6 - 1.2 mmol/L Final     Lab Results  Component Value Date   VALPROATE 54.6 04/02/2018    TSH at 3   Normal ammonia 47 in 2019.  .res Assessment: Plan:    Bipolar I disorder with depression (HCC)  Generalized anxiety disorder  Obstructive sleep  apnea  Attention deficit hyperactivity disorder (ADHD), combined type - Plan: lisdexamfetamine ([name] ) 70 MG capsule, lisdexamfetamine ([name] ) 70 MG capsule, lisdexamfetamine ([name] ) 70 MG capsule, DISCONTINUED: lisdexamfetamine ([name] ) 70 MG capsule, DISCONTINUED: lisdexamfetamine ([name] ) 70 MG capsule  Insomnia due to mental condition  Restless legs syndrome  Low vitamin D  level    face to face time with patient and his wife. \  We discussed his treatment resistant bipolar depression which is associated with a lot of mixed symptoms of irritability and irregular sleep pattern.  His symptoms have been severe and very resistant to any benefit.    He cannot tolerate an increase in the Depakote  based on prior ataxic experiences at 1500 mg daily.  Higher doses of lithium  have not helped anymore.  He has failed multiple medications and ECT and TMS. HowEver the flatness is much improved with the reduction in in Vraylar  to 1.5 mg every other day.  He has had no worsening anxiety, mood swings nor depression.  He was not having extreme cycling like he did in the past.  We previously had reduced lithium  to 600 mg daily and reduce Depakote  to 500 mg daily so those were not changed. reduced use of CPAP bc gagging.  Is interested in seeing if behavioral therapy could work bc negative medical workup.  He has been less and less compliant with his CPAP lately due to gagging problem  . RLS managed.  Pramipexole  prn for RLS this is managed at this time.  Continue vitamin D  check level later.  Continue B12. Extensive discussions about possible supplements that could be helpful.  He will continue the following. B-Complex 150 Still rec N-Acetylcysteine at 1000-1200 mg daily to help with mild cognitive problems and depression.  It can be combined with a B-complex vitamin as the B-12 and folate have been shown to sometimes enhance the effect. L-methylfolate 15 mg daily for depression.  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  Discussed his polypharmacy which is not ideal but has been necessary to control the mood cycling and now his depression has improved.  Plus he also has ADD that has to be managed.  He is satisfied with his ADD management with [name] .  30 min counseling around how to deal with elderly father in law with cognitive px , psychotic sx and health px.  Disc safety issues and answered question s about how meds are used generally. Also discussed potential behavior therapy for gagging and how it is done.  No clear indications for med changes.  No reduction until less stressful job. Better with Reduced Vraylar  re: flatness is better.  1.5 mg every other day.  Continue [name]   70 mg, Depakote  ER 1000, Cytomel  37.5, lithium  600,  nocturia better  Trial Zofran  8 mg  for gaggin failed.    Disc CBT details around gagging with behavioral physical tools to try to adapt like chin strap  etc.  PE with Dr. Clarice December 2024 for labs. Checked lithium  level, TSH, D emphasized.    FU 3 months.    Lorene Macintosh, MD, DFAPA   Please see After Visit Summary for patient specific instructions.  Future Appointments  Date Time Provider Department Center  06/13/2024 10:00 AM Cottle, Lorene KANDICE Raddle., MD [facility] None      No orders of the defined types were placed in this encounter.      -------------------------------

## 2024-03-11 ENCOUNTER — Other Ambulatory Visit: Payer: Self-pay | Admitting: Psychiatry

## 2024-03-11 DIAGNOSIS — F319 Bipolar disorder, unspecified: Secondary | ICD-10-CM

## 2024-04-09 DIAGNOSIS — R0989 Other specified symptoms and signs involving the circulatory and respiratory systems: Secondary | ICD-10-CM | POA: Diagnosis not present

## 2024-04-09 DIAGNOSIS — R051 Acute cough: Secondary | ICD-10-CM | POA: Diagnosis not present

## 2024-05-13 ENCOUNTER — Telehealth: Payer: Self-pay | Admitting: Psychiatry

## 2024-05-13 NOTE — Telephone Encounter (Signed)
 Pt lvm requesting Rx [name]  70 mg. Would like to pick up Friday. [facility]. Apt 12/4

## 2024-05-13 NOTE — Telephone Encounter (Signed)
 LF 10/8, due 11/5

## 2024-05-15 ENCOUNTER — Other Ambulatory Visit: Payer: Self-pay

## 2024-05-15 DIAGNOSIS — F902 Attention-deficit hyperactivity disorder, combined type: Secondary | ICD-10-CM

## 2024-05-15 MED ORDER — LISDEXAMFETAMINE DIMESYLATE 70 MG PO CAPS: 70.0000 mg | ORAL_CAPSULE | Freq: Every day | ORAL | 0 refills | Status: DC

## 2024-05-15 NOTE — Telephone Encounter (Signed)
 Pended

## 2024-06-12 ENCOUNTER — Telehealth: Payer: Self-pay | Admitting: Psychiatry

## 2024-06-12 NOTE — Telephone Encounter (Signed)
 Pt requesting Rx [name]  70 mg   [facility] [address]. Apt 1/8

## 2024-06-12 NOTE — Telephone Encounter (Signed)
 LF 11/7, due 12/5

## 2024-06-13 ENCOUNTER — Ambulatory Visit (INDEPENDENT_AMBULATORY_CARE_PROVIDER_SITE_OTHER): Admitting: Psychiatry

## 2024-06-14 ENCOUNTER — Other Ambulatory Visit: Payer: Self-pay

## 2024-06-14 DIAGNOSIS — F902 Attention-deficit hyperactivity disorder, combined type: Secondary | ICD-10-CM

## 2024-06-14 MED ORDER — LISDEXAMFETAMINE DIMESYLATE 70 MG PO CAPS: 70.0000 mg | ORAL_CAPSULE | Freq: Every day | ORAL | 0 refills | Status: AC

## 2024-06-14 NOTE — Telephone Encounter (Signed)
 Pended

## 2024-07-01 DIAGNOSIS — Z125 Encounter for screening for malignant neoplasm of prostate: Secondary | ICD-10-CM | POA: Diagnosis not present

## 2024-07-01 DIAGNOSIS — R739 Hyperglycemia, unspecified: Secondary | ICD-10-CM | POA: Diagnosis not present

## 2024-07-01 DIAGNOSIS — Z0000 Encounter for general adult medical examination without abnormal findings: Secondary | ICD-10-CM | POA: Diagnosis not present

## 2024-07-01 DIAGNOSIS — E039 Hypothyroidism, unspecified: Secondary | ICD-10-CM | POA: Diagnosis not present

## 2024-07-09 ENCOUNTER — Other Ambulatory Visit: Payer: Self-pay | Admitting: Psychiatry

## 2024-07-09 DIAGNOSIS — F988 Other specified behavioral and emotional disorders with onset usually occurring in childhood and adolescence: Secondary | ICD-10-CM | POA: Diagnosis not present

## 2024-07-09 DIAGNOSIS — Z0000 Encounter for general adult medical examination without abnormal findings: Secondary | ICD-10-CM | POA: Diagnosis not present

## 2024-07-09 DIAGNOSIS — K219 Gastro-esophageal reflux disease without esophagitis: Secondary | ICD-10-CM | POA: Diagnosis not present

## 2024-07-09 DIAGNOSIS — R7989 Other specified abnormal findings of blood chemistry: Secondary | ICD-10-CM

## 2024-07-09 DIAGNOSIS — E039 Hypothyroidism, unspecified: Secondary | ICD-10-CM | POA: Diagnosis not present

## 2024-07-09 DIAGNOSIS — G4733 Obstructive sleep apnea (adult) (pediatric): Secondary | ICD-10-CM | POA: Diagnosis not present

## 2024-07-12 ENCOUNTER — Telehealth: Payer: Self-pay | Admitting: Psychiatry

## 2024-07-12 NOTE — Telephone Encounter (Signed)
 Pt has a RF available, notified him.

## 2024-07-12 NOTE — Telephone Encounter (Signed)
 Patient called in for refill on Vvyanse 70mg . Ph: [phone] Appt 1/8 Pharmacy [facility] [address] [place], [place]

## 2024-07-18 ENCOUNTER — Ambulatory Visit: Admitting: Psychiatry

## 2024-08-14 ENCOUNTER — Other Ambulatory Visit: Payer: Self-pay

## 2024-08-14 ENCOUNTER — Telehealth: Payer: Self-pay | Admitting: Psychiatry

## 2024-08-14 DIAGNOSIS — F902 Attention-deficit hyperactivity disorder, combined type: Secondary | ICD-10-CM

## 2024-08-14 MED ORDER — LISDEXAMFETAMINE DIMESYLATE 70 MG PO CAPS: 70.0000 mg | ORAL_CAPSULE | Freq: Every day | ORAL | 0 refills | Status: AC

## 2024-08-14 NOTE — Telephone Encounter (Signed)
 Pended one RF since he has an appointment in a couple of weeks.

## 2024-08-14 NOTE — Telephone Encounter (Signed)
 Pt lvm requesting  next 3 Rx [name]  to [facility]. Apt 2/19  would like to pick up Thursday or Friday.

## 2024-08-29 ENCOUNTER — Ambulatory Visit: Admitting: Psychiatry
